# Patient Record
Sex: Female | Born: 1950
Health system: Southern US, Community
[De-identification: ages and names within clinical notes are randomized; demographics above are authoritative.]

## PROBLEM LIST (undated history)

## (undated) DIAGNOSIS — F32A Depression, unspecified: Secondary | ICD-10-CM

## (undated) DIAGNOSIS — N809 Endometriosis, unspecified: Secondary | ICD-10-CM

## (undated) DIAGNOSIS — F329 Major depressive disorder, single episode, unspecified: Secondary | ICD-10-CM

## (undated) DIAGNOSIS — F419 Anxiety disorder, unspecified: Secondary | ICD-10-CM

## (undated) DIAGNOSIS — I10 Essential (primary) hypertension: Secondary | ICD-10-CM

## (undated) DIAGNOSIS — E559 Vitamin D deficiency, unspecified: Secondary | ICD-10-CM

## (undated) DIAGNOSIS — G4700 Insomnia, unspecified: Secondary | ICD-10-CM

## (undated) DIAGNOSIS — M545 Low back pain, unspecified: Secondary | ICD-10-CM

## (undated) DIAGNOSIS — R5383 Other fatigue: Secondary | ICD-10-CM

## (undated) DIAGNOSIS — E785 Hyperlipidemia, unspecified: Secondary | ICD-10-CM

## (undated) DIAGNOSIS — E079 Disorder of thyroid, unspecified: Secondary | ICD-10-CM

## (undated) DIAGNOSIS — M810 Age-related osteoporosis without current pathological fracture: Secondary | ICD-10-CM

## (undated) HISTORY — DX: Major depressive disorder, single episode, unspecified: F32.9

## (undated) HISTORY — DX: Essential (primary) hypertension: I10

## (undated) HISTORY — PX: OTHER SURGICAL HISTORY: SHX169

## (undated) HISTORY — DX: Other fatigue: R53.83

## (undated) HISTORY — DX: Age-related osteoporosis without current pathological fracture: M81.0

## (undated) HISTORY — DX: Insomnia, unspecified: G47.00

## (undated) HISTORY — PX: TUBAL LIGATION: SHX77

## (undated) HISTORY — DX: Depression, unspecified: F32.A

## (undated) HISTORY — DX: Endometriosis, unspecified: N80.9

## (undated) HISTORY — DX: Anxiety disorder, unspecified: F41.9

## (undated) HISTORY — DX: Low back pain, unspecified: M54.50

## (undated) HISTORY — DX: Low back pain: M54.5

## (undated) HISTORY — DX: Disorder of thyroid, unspecified: E07.9

## (undated) HISTORY — DX: Hyperlipidemia, unspecified: E78.5

## (undated) HISTORY — DX: Vitamin D deficiency, unspecified: E55.9

---

## 2005-09-16 ENCOUNTER — Other Ambulatory Visit: Payer: Self-pay

## 2005-09-23 ENCOUNTER — Ambulatory Visit: Payer: Self-pay | Admitting: Otolaryngology

## 2007-01-20 ENCOUNTER — Ambulatory Visit: Payer: Self-pay | Admitting: General Surgery

## 2013-07-30 ENCOUNTER — Ambulatory Visit: Payer: Self-pay

## 2014-01-04 LAB — HM PAP SMEAR

## 2014-04-22 LAB — HM MAMMOGRAPHY

## 2015-01-06 DIAGNOSIS — G4700 Insomnia, unspecified: Secondary | ICD-10-CM | POA: Diagnosis not present

## 2015-01-27 DIAGNOSIS — F0390 Unspecified dementia without behavioral disturbance: Secondary | ICD-10-CM | POA: Diagnosis not present

## 2015-01-27 DIAGNOSIS — F419 Anxiety disorder, unspecified: Secondary | ICD-10-CM | POA: Diagnosis not present

## 2015-01-27 DIAGNOSIS — Z0000 Encounter for general adult medical examination without abnormal findings: Secondary | ICD-10-CM | POA: Diagnosis not present

## 2015-01-27 DIAGNOSIS — C8510 Unspecified B-cell lymphoma, unspecified site: Secondary | ICD-10-CM | POA: Diagnosis not present

## 2015-01-27 DIAGNOSIS — E039 Hypothyroidism, unspecified: Secondary | ICD-10-CM | POA: Diagnosis not present

## 2015-01-27 DIAGNOSIS — G4700 Insomnia, unspecified: Secondary | ICD-10-CM | POA: Diagnosis not present

## 2015-01-30 ENCOUNTER — Other Ambulatory Visit: Payer: Self-pay | Admitting: Unknown Physician Specialty

## 2015-01-30 DIAGNOSIS — M816 Localized osteoporosis [Lequesne]: Secondary | ICD-10-CM

## 2015-02-18 ENCOUNTER — Ambulatory Visit
Admission: RE | Admit: 2015-02-18 | Discharge: 2015-02-18 | Disposition: A | Discharge status: Home / Self Care | Payer: Commercial Managed Care - HMO | Source: Ambulatory Visit | Attending: Unknown Physician Specialty | Admitting: Unknown Physician Specialty

## 2015-02-18 DIAGNOSIS — M810 Age-related osteoporosis without current pathological fracture: Secondary | ICD-10-CM | POA: Insufficient documentation

## 2015-02-18 DIAGNOSIS — M816 Localized osteoporosis [Lequesne]: Secondary | ICD-10-CM

## 2015-02-18 LAB — HM DEXA SCAN

## 2015-04-04 ENCOUNTER — Telehealth: Payer: Self-pay

## 2015-04-04 ENCOUNTER — Other Ambulatory Visit: Payer: Self-pay

## 2015-04-04 NOTE — Telephone Encounter (Signed)
Patient was last seen for this on 01/06/15 and was last refilled on 02/13/15. [facility] Partner number is [phone] and pharmacy is [facility] [place].

## 2015-04-04 NOTE — Telephone Encounter (Signed)
Opened encounter in error  

## 2015-04-07 MED ORDER — ALPRAZOLAM 0.5 MG PO TABS: 0.5000 mg | ORAL_TABLET | Freq: Every evening | ORAL | Status: DC | PRN

## 2015-05-19 ENCOUNTER — Other Ambulatory Visit: Payer: Self-pay | Admitting: Family Medicine

## 2015-05-19 NOTE — Telephone Encounter (Signed)
routing to correct provider.

## 2015-05-20 ENCOUNTER — Other Ambulatory Visit: Payer: Self-pay | Admitting: Unknown Physician Specialty

## 2015-05-20 MED ORDER — ALPRAZOLAM 0.5 MG PO TABS: 0.5000 mg | ORAL_TABLET | Freq: Every day | ORAL | Status: DC

## 2015-05-26 ENCOUNTER — Telehealth: Payer: Self-pay | Admitting: Unknown Physician Specialty

## 2015-05-26 MED ORDER — MECLIZINE HCL 25 MG PO TABS: 25.0000 mg | ORAL_TABLET | Freq: Three times a day (TID) | ORAL | Status: DC | PRN

## 2015-05-26 NOTE — Telephone Encounter (Signed)
Routing to provider. Patient was last seen 01/27/15 but has not had this medication since 2014 according to practice partner [phone]). [facility] [place].

## 2015-05-26 NOTE — Telephone Encounter (Signed)
Patient called need refill on her Rx: [facility]

## 2015-08-01 DIAGNOSIS — H2513 Age-related nuclear cataract, bilateral: Secondary | ICD-10-CM | POA: Diagnosis not present

## 2015-12-04 ENCOUNTER — Telehealth: Payer: Self-pay | Admitting: Unknown Physician Specialty

## 2015-12-04 MED ORDER — ALPRAZOLAM 0.5 MG PO TABS: 0.5000 mg | ORAL_TABLET | Freq: Every day | ORAL | Status: DC

## 2015-12-04 NOTE — Telephone Encounter (Signed)
Pharmacy called requesting refill for the pts ALPRAZolam (XANAX) 0.5 MG tablet

## 2015-12-04 NOTE — Telephone Encounter (Signed)
Routing to provider. Patient was last seen 01/27/15 for a physical and does not have appointment scheduled. Malachy Mood please route this back to me so I can call and try to schedule the patient an appointment.

## 2015-12-04 NOTE — Telephone Encounter (Signed)
Thanks

## 2015-12-05 NOTE — Telephone Encounter (Signed)
Called and scheduled patient an appointment for 12/22/15.

## 2015-12-05 NOTE — Telephone Encounter (Signed)
Medication was called in to the pharmacy.  

## 2015-12-08 DIAGNOSIS — E063 Autoimmune thyroiditis: Secondary | ICD-10-CM | POA: Insufficient documentation

## 2015-12-08 DIAGNOSIS — F329 Major depressive disorder, single episode, unspecified: Secondary | ICD-10-CM | POA: Insufficient documentation

## 2015-12-08 DIAGNOSIS — M810 Age-related osteoporosis without current pathological fracture: Secondary | ICD-10-CM

## 2015-12-08 DIAGNOSIS — E7800 Pure hypercholesterolemia, unspecified: Secondary | ICD-10-CM | POA: Insufficient documentation

## 2015-12-08 DIAGNOSIS — G4700 Insomnia, unspecified: Secondary | ICD-10-CM | POA: Insufficient documentation

## 2015-12-08 DIAGNOSIS — E039 Hypothyroidism, unspecified: Secondary | ICD-10-CM

## 2015-12-08 DIAGNOSIS — E559 Vitamin D deficiency, unspecified: Secondary | ICD-10-CM

## 2015-12-08 DIAGNOSIS — R5383 Other fatigue: Secondary | ICD-10-CM

## 2015-12-08 DIAGNOSIS — F419 Anxiety disorder, unspecified: Secondary | ICD-10-CM

## 2015-12-08 DIAGNOSIS — M545 Low back pain, unspecified: Secondary | ICD-10-CM | POA: Insufficient documentation

## 2015-12-08 DIAGNOSIS — I10 Essential (primary) hypertension: Secondary | ICD-10-CM

## 2015-12-22 ENCOUNTER — Encounter: Payer: Self-pay | Admitting: Unknown Physician Specialty

## 2015-12-22 ENCOUNTER — Ambulatory Visit (INDEPENDENT_AMBULATORY_CARE_PROVIDER_SITE_OTHER): Payer: Commercial Managed Care - HMO | Admitting: Unknown Physician Specialty

## 2015-12-22 VITALS — BP 130/73 | HR 60 | Temp 97.8°F | Ht 58.5 in | Wt 104.0 lb

## 2015-12-22 DIAGNOSIS — R5382 Chronic fatigue, unspecified: Secondary | ICD-10-CM

## 2015-12-22 DIAGNOSIS — M810 Age-related osteoporosis without current pathological fracture: Secondary | ICD-10-CM | POA: Diagnosis not present

## 2015-12-22 DIAGNOSIS — E559 Vitamin D deficiency, unspecified: Secondary | ICD-10-CM | POA: Diagnosis not present

## 2015-12-22 DIAGNOSIS — F419 Anxiety disorder, unspecified: Secondary | ICD-10-CM

## 2015-12-22 DIAGNOSIS — E7800 Pure hypercholesterolemia, unspecified: Secondary | ICD-10-CM | POA: Diagnosis not present

## 2015-12-22 DIAGNOSIS — F418 Other specified anxiety disorders: Secondary | ICD-10-CM

## 2015-12-22 DIAGNOSIS — F329 Major depressive disorder, single episode, unspecified: Secondary | ICD-10-CM

## 2015-12-22 MED ORDER — FOSTEUM 27-20-200 MG-MG-UNIT PO CAPS: ORAL_CAPSULE | ORAL | Status: DC

## 2015-12-22 NOTE — Assessment & Plan Note (Addendum)
Wants an rx for Fosteum that is supposed to help with .  Discussed with patient that this is not a FDA medication approved for osteoporosis.  Pt is refusing Fosamax and Evista.

## 2015-12-22 NOTE — Patient Instructions (Addendum)
Insomnia is a frustrating problem and fortunately, for most, it can be managed with lifestyle changes.  I  find the most effective strategies seem to be exercise, decreasing caffeine and screen time.    In addition to the above, studies have shown that cognitive behavioral therapies for sleep are more effective than medications.  There are some less expensive on-line programs for this that are a self-paced 6 week program.  Go to shuti.com(used by sleep labs) or cbtforsleep.com.  There are others that are probably just as effective.       Insomnia Insomnia is a sleep disorder that makes it difficult to fall asleep or to stay asleep. Insomnia can cause tiredness (fatigue), low energy, difficulty concentrating, mood swings, and poor performance at work or school.  There are three different ways to classify insomnia:  Difficulty falling asleep.  Difficulty staying asleep.  Waking up too early in the morning. Any type of insomnia can be long-term (chronic) or short-term (acute). Both are common. Short-term insomnia usually lasts for three months or less. Chronic insomnia occurs at least three times a week for longer than three months. CAUSES  Insomnia may be caused by another condition, situation, or substance, such as:  Anxiety.  Certain medicines.  Gastroesophageal reflux disease (GERD) or other gastrointestinal conditions.  Asthma or other breathing conditions.  Restless legs syndrome, sleep apnea, or other sleep disorders.  Chronic pain.  Menopause. This may include hot flashes.  Stroke.  Abuse of alcohol, tobacco, or illegal drugs.  Depression.  Caffeine.   Neurological disorders, such as Alzheimer disease.  An overactive thyroid (hyperthyroidism). The cause of insomnia may not be known. RISK FACTORS Risk factors for insomnia include:  Gender. Women are more commonly affected than men.  Age. Insomnia is more common as you get older.  Stress. This may involve your  professional or personal life.  Income. Insomnia is more common in people with lower income.  Lack of exercise.   Irregular work schedule or night shifts.  Traveling between different time zones. SIGNS AND SYMPTOMS If you have insomnia, trouble falling asleep or trouble staying asleep is the main symptom. This may lead to other symptoms, such as:  Feeling fatigued.  Feeling nervous about going to sleep.  Not feeling rested in the morning.  Having trouble concentrating.  Feeling irritable, anxious, or depressed. TREATMENT  Treatment for insomnia depends on the cause. If your insomnia is caused by an underlying condition, treatment will focus on addressing the condition. Treatment may also include:   Medicines to help you sleep.  Counseling or therapy.  Lifestyle adjustments. HOME CARE INSTRUCTIONS   Take medicines only as directed by your health care provider.  Keep regular sleeping and waking hours. Avoid naps.  Keep a sleep diary to help you and your health care provider figure out what could be causing your insomnia. Include:   When you sleep.  When you wake up during the night.  How well you sleep.   How rested you feel the next day.  Any side effects of medicines you are taking.  What you eat and drink.   Make your bedroom a comfortable place where it is easy to fall asleep:  Put up shades or special blackout curtains to block light from outside.  Use a white noise machine to block noise.  Keep the temperature cool.   Exercise regularly as directed by your health care provider. Avoid exercising right before bedtime.  Use relaxation techniques to manage stress. Ask your   health care provider to suggest some techniques that may work well for you. These may include:  Breathing exercises.  Routines to release muscle tension.  Visualizing peaceful scenes.  Cut back on alcohol, caffeinated beverages, and cigarettes, especially close to bedtime.  These can disrupt your sleep.  Do not overeat or eat spicy foods right before bedtime. This can lead to digestive discomfort that can make it hard for you to sleep.  Limit screen use before bedtime. This includes:  Watching TV.  Using your smartphone, tablet, and computer.  Stick to a routine. This can help you fall asleep faster. Try to do a quiet activity, brush your teeth, and go to bed at the same time each night.  Get out of bed if you are still awake after 15 minutes of trying to sleep. Keep the lights down, but try reading or doing a quiet activity. When you feel sleepy, go back to bed.  Make sure that you drive carefully. Avoid driving if you feel very sleepy.  Keep all follow-up appointments as directed by your health care provider. This is important. SEEK MEDICAL CARE IF:   You are tired throughout the day or have trouble in your daily routine due to sleepiness.  You continue to have sleep problems or your sleep problems get worse. SEEK IMMEDIATE MEDICAL CARE IF:   You have serious thoughts about hurting yourself or someone else.   This information is not intended to replace advice given to you by your health care provider. Make sure you discuss any questions you have with your health care provider.   Document Released: 09/10/2000 Document Revised: 06/04/2015 Document Reviewed: 06/14/2014 Elsevier Interactive Patient Education 2016 Elsevier Inc.  

## 2015-12-22 NOTE — Assessment & Plan Note (Signed)
Check lipid panel  

## 2015-12-22 NOTE — Progress Notes (Signed)
-+--------------------  BP 130/73 mmHg  Pulse 60  Temp(Src) 97.8 F (36.6 C)  Ht 4' 10.5" (1.486 m)  Wt 104 lb (47.174 kg)  BMI 21.36 kg/m2  SpO2 99%   Subjective:    Patient ID: Brandy Chen, female    DOB: [birth year], 65 y.o.   MRN: [id number]  HPI: Brandy Chen is a 65 y.o. female  Chief Complaint  Patient presents with  . Anxiety    pt states she is not due for xanax refill yet, but needed to come in to get more refills   Anxiety/insomnia States she takes 1 xanax/day and takes it QHS.  States they have changed the color of the pills and "there is a difference."  She does have trouble staying asleep.  Does admit to some sleep hygeine issues including caffeine with dinner and has a TV in her room.    Depression screen [facility] 2/9 12/22/2015  Decreased Interest 0  Down, Depressed, Hopeless 0  PHQ - 2 Score 0   Fatigue This is an ongoing problem "most of the time."  She forces herself to continue to "keep rolling."  Osteoporosis Reveiwed T scores.  She has refused Fosamax and Evista.  She would like to take a nutraceutical food for help.    Relevant past medical, surgical, family and social history reviewed and updated as indicated. Interim medical history since our last visit reviewed. Allergies and medications reviewed and updated.  Review of Systems  Constitutional: Positive for fatigue.  HENT: Negative.   Eyes: Negative.   Respiratory: Negative.   Cardiovascular: Negative.   Gastrointestinal: Negative.   Endocrine: Negative.   Genitourinary: Negative.   Skin: Negative.   Allergic/Immunologic: Negative.   Neurological: Negative.   Hematological: Negative.   Psychiatric/Behavioral: Positive for sleep disturbance.    Per HPI unless specifically indicated above     Objective:    BP 130/73 mmHg  Pulse 60  Temp(Src) 97.8 F (36.6 C)  Ht 4' 10.5" (1.486 m)  Wt 104 lb (47.174 kg)  BMI 21.36 kg/m2  SpO2 99%  Wt Readings from Last 3 Encounters:  12/22/15 104  lb (47.174 kg)  01/27/15 106 lb (48.081 kg)    Physical Exam  Constitutional: She is oriented to person, place, and time. She appears well-developed and well-nourished. No distress.  HENT:  Head: Normocephalic and atraumatic.  Eyes: Conjunctivae and lids are normal. Right eye exhibits no discharge. Left eye exhibits no discharge. No scleral icterus.  Neck: Normal range of motion. Neck supple. No JVD present. Carotid bruit is not present.  Cardiovascular: Normal rate, regular rhythm and normal heart sounds.   Pulmonary/Chest: Effort normal and breath sounds normal.  Abdominal: Normal appearance. There is no splenomegaly or hepatomegaly.  Musculoskeletal: Normal range of motion.  Neurological: She is alert and oriented to person, place, and time.  Skin: Skin is warm, dry and intact. No rash noted. No pallor.  Psychiatric: She has a normal mood and affect. Her behavior is normal. Judgment and thought content normal.      Assessment & Plan:   Problem List Items Addressed This Visit      Unprioritized   Fatigue    Check CBC and TSH      Relevant Orders   Comprehensive metabolic panel   Hypercholesteremia    Check lipid panel      Anxiety and depression    Stable.  Continue present      Osteoporosis    Wants an rx for [employer]  that is supposed to help with .  Discussed with patient that this is not a FDA medication approved for osteoporosis.  Pt is refusing Fosamax and Evista.        Relevant Medications   Calcium Carb-Cholecalciferol (CALCIUM 600+D) 600-800 MG-UNIT TABS   Vitamin D deficiency - Primary       Follow up plan: Return for wellness physical.

## 2015-12-22 NOTE — Assessment & Plan Note (Signed)
Check CBC and TSH 

## 2015-12-22 NOTE — Assessment & Plan Note (Signed)
Stable.  Continue present 

## 2015-12-23 ENCOUNTER — Encounter: Payer: Self-pay | Admitting: Unknown Physician Specialty

## 2015-12-23 LAB — COMPREHENSIVE METABOLIC PANEL
ALBUMIN: 4.4 g/dL (ref 3.6–4.8)
ALK PHOS: 83 IU/L (ref 39–117)
ALT: 16 IU/L (ref 0–32)
AST: 24 IU/L (ref 0–40)
Albumin/Globulin Ratio: 1.9 (ref 1.2–2.2)
BILIRUBIN TOTAL: 0.4 mg/dL (ref 0.0–1.2)
BUN / CREAT RATIO: 20 (ref 11–26)
BUN: 12 mg/dL (ref 8–27)
CHLORIDE: 101 mmol/L (ref 96–106)
CO2: 24 mmol/L (ref 18–29)
Calcium: 9.3 mg/dL (ref 8.7–10.3)
Creatinine, Ser: 0.61 mg/dL (ref 0.57–1.00)
GFR calc Af Amer: 108 mL/min/{1.73_m2} (ref >59)
GFR calc non Af Amer: 94 mL/min/{1.73_m2} (ref >59)
GLOBULIN, TOTAL: 2.3 g/dL (ref 1.5–4.5)
Glucose: 86 mg/dL (ref 65–99)
POTASSIUM: 4.7 mmol/L (ref 3.5–5.2)
SODIUM: 139 mmol/L (ref 134–144)
Total Protein: 6.7 g/dL (ref 6.0–8.5)

## 2015-12-23 NOTE — Progress Notes (Signed)
Quick Note:  Normal labs. Patient notified by letter. ______ 

## 2016-01-01 ENCOUNTER — Other Ambulatory Visit: Payer: Self-pay | Admitting: Unknown Physician Specialty

## 2016-01-01 NOTE — Telephone Encounter (Signed)
Routing to provider  

## 2016-01-01 NOTE — Telephone Encounter (Signed)
Pt called would like a call back regarding lab results she stated were not included in the letter she received. Thanks.

## 2016-01-02 MED ORDER — ALPRAZOLAM 0.5 MG PO TABS: 0.5000 mg | ORAL_TABLET | Freq: Every day | ORAL | Status: DC

## 2016-01-02 NOTE — Telephone Encounter (Signed)
I talked to Lattie Haw, for some reason it seems the additional labs didn't get done.  Can she come back and have them drawn?  Fasting if possible

## 2016-01-02 NOTE — Telephone Encounter (Signed)
Thanks, I see from my note I ordered a CBC and TSH.  Can you ask the lab where those results are?

## 2016-01-02 NOTE — Telephone Encounter (Signed)
Routing to provider, she'd like a refill on Alprazolam.

## 2016-01-02 NOTE — Telephone Encounter (Signed)
Pt scheduled for a lab visit 01/05/16 @ 9am. Pt stated she also did not get RX for Alprazelam. Thanks.

## 2016-01-05 ENCOUNTER — Other Ambulatory Visit: Payer: Commercial Managed Care - HMO

## 2016-01-05 ENCOUNTER — Other Ambulatory Visit: Payer: Self-pay | Admitting: Unknown Physician Specialty

## 2016-01-05 DIAGNOSIS — E559 Vitamin D deficiency, unspecified: Secondary | ICD-10-CM

## 2016-01-05 DIAGNOSIS — E7800 Pure hypercholesterolemia, unspecified: Secondary | ICD-10-CM | POA: Diagnosis not present

## 2016-01-05 DIAGNOSIS — R5382 Chronic fatigue, unspecified: Secondary | ICD-10-CM

## 2016-01-05 DIAGNOSIS — E039 Hypothyroidism, unspecified: Secondary | ICD-10-CM

## 2016-01-06 LAB — CBC WITH DIFFERENTIAL/PLATELET
BASOS: 1 %
Basophils Absolute: 0.1 10*3/uL (ref 0.0–0.2)
EOS (ABSOLUTE): 0.1 10*3/uL (ref 0.0–0.4)
EOS: 1 %
Hematocrit: 42.7 % (ref 34.0–46.6)
Hemoglobin: 14.1 g/dL (ref 11.1–15.9)
IMMATURE GRANULOCYTES: 0 %
Immature Grans (Abs): 0 10*3/uL (ref 0.0–0.1)
LYMPHS: 35 %
Lymphocytes Absolute: 1.8 10*3/uL (ref 0.7–3.1)
MCH: 30.3 pg (ref 26.6–33.0)
MCHC: 33 g/dL (ref 31.5–35.7)
MCV: 92 fL (ref 79–97)
Monocytes Absolute: 0.4 10*3/uL (ref 0.1–0.9)
Monocytes: 8 %
NEUTROS PCT: 55 %
Neutrophils Absolute: 2.7 10*3/uL (ref 1.4–7.0)
PLATELETS: 243 10*3/uL (ref 150–379)
RBC: 4.66 x10E6/uL (ref 3.77–5.28)
RDW: 13.8 % (ref 12.3–15.4)
WBC: 5.1 10*3/uL (ref 3.4–10.8)

## 2016-01-06 LAB — LIPID PANEL W/O CHOL/HDL RATIO
Cholesterol, Total: 218 mg/dL — ABNORMAL HIGH (ref 100–199)
HDL: 66 mg/dL (ref >39)
LDL Calculated: 135 mg/dL — ABNORMAL HIGH (ref 0–99)
Triglycerides: 86 mg/dL (ref 0–149)
VLDL Cholesterol Cal: 17 mg/dL (ref 5–40)

## 2016-01-06 LAB — TSH: TSH: 6.14 u[IU]/mL — ABNORMAL HIGH (ref 0.450–4.500)

## 2016-01-06 LAB — VITAMIN D 25 HYDROXY (VIT D DEFICIENCY, FRACTURES): Vit D, 25-Hydroxy: 55.7 ng/mL (ref 30.0–100.0)

## 2016-01-08 ENCOUNTER — Telehealth: Payer: Self-pay

## 2016-01-08 NOTE — Telephone Encounter (Signed)
Patient called and stated that Malachy Mood had tried to call her yesterday about lab results. Patient wanted to know if she could ask me some questions and I said sure. She asked what her cholesterol was this time versus last time and I gave her those values. Patient also stated that Malachy Mood mentioned something about thyroid medication in the voicemail that she left yesterday. I told the patient that I did not usually talk with patient's about their labs because I did not know how to answer the questions they have. Patient understood this and stated that she could wait to talk to [facility] next month at her appointment or Malachy Mood could call her back.

## 2016-01-09 NOTE — Telephone Encounter (Signed)
Discussed low TSH with patient.  Will recheck 3-6 months at pt request.  I was planning on starting Synthroid due to fatigue, but I will hold off and follow TSH trajectory

## 2016-02-16 ENCOUNTER — Encounter: Payer: Self-pay | Admitting: Unknown Physician Specialty

## 2016-02-16 ENCOUNTER — Ambulatory Visit (INDEPENDENT_AMBULATORY_CARE_PROVIDER_SITE_OTHER): Payer: Commercial Managed Care - HMO | Admitting: Unknown Physician Specialty

## 2016-02-16 VITALS — BP 141/78 | HR 65 | Temp 98.1°F | Ht 58.1 in | Wt 102.2 lb

## 2016-02-16 DIAGNOSIS — E7800 Pure hypercholesterolemia, unspecified: Secondary | ICD-10-CM | POA: Diagnosis not present

## 2016-02-16 DIAGNOSIS — F329 Major depressive disorder, single episode, unspecified: Secondary | ICD-10-CM

## 2016-02-16 DIAGNOSIS — I10 Essential (primary) hypertension: Secondary | ICD-10-CM | POA: Diagnosis not present

## 2016-02-16 DIAGNOSIS — Z1231 Encounter for screening mammogram for malignant neoplasm of breast: Secondary | ICD-10-CM

## 2016-02-16 DIAGNOSIS — F418 Other specified anxiety disorders: Secondary | ICD-10-CM | POA: Diagnosis not present

## 2016-02-16 DIAGNOSIS — R634 Abnormal weight loss: Secondary | ICD-10-CM

## 2016-02-16 DIAGNOSIS — F419 Anxiety disorder, unspecified: Secondary | ICD-10-CM

## 2016-02-16 DIAGNOSIS — Z23 Encounter for immunization: Secondary | ICD-10-CM

## 2016-02-16 DIAGNOSIS — F32A Depression, unspecified: Secondary | ICD-10-CM

## 2016-02-16 DIAGNOSIS — Z0000 Encounter for general adult medical examination without abnormal findings: Secondary | ICD-10-CM | POA: Diagnosis not present

## 2016-02-16 MED ORDER — ALPRAZOLAM 0.5 MG PO TABS: 0.5000 mg | ORAL_TABLET | Freq: Every day | ORAL | Status: DC

## 2016-02-16 NOTE — Assessment & Plan Note (Signed)
Refill xanax

## 2016-02-16 NOTE — Patient Instructions (Addendum)
Please do call to schedule your mammogram; the number to schedule one at either [facility] or [facility] is [phone]  Pneumococcal Conjugate Vaccine (PCV13)  1. Why get vaccinated? Vaccination can protect both children and adults from pneumococcal disease. Pneumococcal disease is caused by bacteria that can spread from person to person through close contact. It can cause ear infections, and it can also lead to more serious infections of the:  Lungs (pneumonia),  Blood (bacteremia), and  Covering of the brain and spinal cord (meningitis). Pneumococcal pneumonia is most common among adults. Pneumococcal meningitis can cause deafness and brain damage, and it kills about 1 child in 10 who get it. Anyone can get pneumococcal disease, but children under 27 years of age and adults 72 years and older, people with certain medical conditions, and cigarette smokers are at the highest risk. Before there was a vaccine, the [place] saw:  more than 700 cases of meningitis,  about 13,000 blood infections,  about 5 million ear infections, and  about 200 deaths in children under 5 each year from pneumococcal disease. Since vaccine became available, severe pneumococcal disease in these children has fallen by 88%. About 18,000 older adults die of pneumococcal disease each year in the [place]. Treatment of pneumococcal infections with penicillin and other drugs is not as effective as it used to be, because some strains of the disease have become resistant to these drugs. This makes prevention of the disease, through vaccination, even more important. 2. PCV13 vaccine Pneumococcal conjugate vaccine (called PCV13) protects against 13 types of pneumococcal bacteria. PCV13 is routinely given to children at 2, 4, 6, and 88-55 months of age. It is also recommended for children and adults 22 to 37 years of age with certain health conditions, and for all adults 39 years of  age and older. Your doctor can give you details. 3. Some people should not get this vaccine Anyone who has ever had a life-threatening allergic reaction to a dose of this vaccine, to an earlier pneumococcal vaccine called PCV7, or to any vaccine containing diphtheria toxoid (for example, DTaP), should not get PCV13. Anyone with a severe allergy to any component of PCV13 should not get the vaccine. Tell your doctor if the person being vaccinated has any severe allergies. If the person scheduled for vaccination is not feeling well, your healthcare provider might decide to reschedule the shot on another day. 4. Risks of a vaccine reaction With any medicine, including vaccines, there is a chance of reactions. These are usually mild and go away on their own, but serious reactions are also possible. Problems reported following PCV13 varied by age and dose in the series. The most common problems reported among children were:  About half became drowsy after the shot, had a temporary loss of appetite, or had redness or tenderness where the shot was given.  About 1 out of 3 had swelling where the shot was given.  About 1 out of 3 had a mild fever, and about 1 in 20 had a fever over 102.35F.  Up to about 8 out of 10 became fussy or irritable. Adults have reported pain, redness, and swelling where the shot was given; also mild fever, fatigue, headache, chills, or muscle pain. Young children who get PCV13 along with inactivated flu vaccine at the same time may be at increased risk for seizures caused by fever. Ask your doctor for more information. Problems that could happen after any vaccine:  People sometimes  faint after a medical procedure, including vaccination. Sitting or lying down for about 15 minutes can help prevent fainting, and injuries caused by a fall. Tell your doctor if you feel dizzy, or have vision changes or ringing in the ears.  Some older children and adults get severe pain in the  shoulder and have difficulty moving the arm where a shot was given. This happens very rarely.  Any medication can cause a severe allergic reaction. Such reactions from a vaccine are very rare, estimated at about 1 in a million doses, and would happen within a few minutes to a few hours after the vaccination. As with any medicine, there is a very small chance of a vaccine causing a serious injury or death. The safety of vaccines is always being monitored. For more information, visit: http://www.aguilar.org/ 5. What if there is a serious reaction? What should I look for?  Look for anything that concerns you, such as signs of a severe allergic reaction, very high fever, or unusual behavior. Signs of a severe allergic reaction can include hives, swelling of the face and throat, difficulty breathing, a fast heartbeat, dizziness, and weakness-usually within a few minutes to a few hours after the vaccination. What should I do?  If you think it is a severe allergic reaction or other emergency that can't wait, call 9-1-1 or get the person to the nearest hospital. Otherwise, call your doctor. Reactions should be reported to the Vaccine Adverse Event Reporting System (VAERS). Your doctor should file this report, or you can do it yourself through the VAERS web site at www.vaers.SamedayNews.es, or by calling 724-360-7462. VAERS does not give medical advice. 6. The National Vaccine Injury Compensation Program The [employer] Vaccine Injury Compensation Program (VICP) is a federal program that was created to compensate people who may have been injured by certain vaccines. Persons who believe they may have been injured by a vaccine can learn about the program and about filing a claim by calling 231-115-2421 or visiting the [place] website at GoldCloset.com.ee. There is a time limit to file a claim for compensation. 7. How can I learn more?  Ask your healthcare provider. He or she can give you the  vaccine package insert or suggest other sources of information.  Call your local or state health department.  Contact the Centers for Disease Control and Prevention (CDC):  Call 916-082-7638 (1-800-CDC-INFO) or  Visit CDC's website at http://hunter.com/ Vaccine Information Statement PCV13 Vaccine (08/01/2014)   This information is not intended to replace advice given to you by your health care provider. Make sure you discuss any questions you have with your health care provider.   Document Released: 07/11/2006 Document Revised: 10/04/2014 Document Reviewed: 08/08/2014 Elsevier Interactive Patient Education [employer].

## 2016-02-16 NOTE — Assessment & Plan Note (Signed)
Mostly stable.  Labs are normal with a mildly elevated TSH.  Refusing a colonoscopy and wants a Cologard instead.  Risks reviewed.  Diet modifications discussed

## 2016-02-16 NOTE — Progress Notes (Signed)
BP 141/78 mmHg  Pulse 65  Temp(Src) 98.1 F (36.7 C)  Ht 4' 10.1" (1.476 m)  Wt 102 lb 3.2 oz (46.358 kg)  BMI 21.28 kg/m2  SpO2 98%   Subjective:    Patient ID: Brandy Chen, female    DOB: [birth year], 65 y.o.   MRN: [id number]  HPI: Brandy Chen is a 65 y.o. female  Chief Complaint  Patient presents with  . Medicare Wellness    pt states she maybe be interested cologuard     Social History   Social History  . Marital Status: Married    Spouse Name: N/A  . Number of Children: N/A  . Years of Education: N/A   Occupational History  . Not on file.   Social History Main Topics  . Smoking status: Never Smoker   . Smokeless tobacco: Never Used  . Alcohol Use: No  . Drug Use: No  . Sexual Activity: Not Currently   Other Topics Concern  . Not on file   Social History Narrative   Family History  Problem Relation Age of Onset  . Stroke Mother   . Hypertension Mother   . Stroke Maternal Grandfather   . Hypertension Sister    Functional Status Survey: Is the patient deaf or have difficulty hearing?: No Does the patient have difficulty seeing, even when wearing glasses/contacts?: No Does the patient have difficulty concentrating, remembering, or making decisions?: No Does the patient have difficulty walking or climbing stairs?: No Does the patient have difficulty dressing or bathing?: No Does the patient have difficulty doing errands alone such as visiting a doctor's office or shopping?: No  Depression screen [facility] 2/9 02/16/2016 12/22/2015  Decreased Interest 0 0  Down, Depressed, Hopeless 1 0  PHQ - 2 Score 1 0     Relevant past medical, surgical, family and social history reviewed and updated as indicated. Interim medical history since our last visit reviewed. Allergies and medications reviewed and updated.  Review of Systems  Constitutional: Negative.        Concerned about losing weight.  Admits to a poor appetite.    HENT: Negative.   Eyes:  Negative.   Respiratory: Negative.   Cardiovascular: Negative.   Gastrointestinal: Negative.   Endocrine: Negative.   Genitourinary: Negative.   Musculoskeletal: Negative.   Skin: Negative.   Allergic/Immunologic: Negative.   Neurological: Negative.   Hematological: Negative.   Psychiatric/Behavioral: Negative.     Per HPI unless specifically indicated above     Objective:    BP 141/78 mmHg  Pulse 65  Temp(Src) 98.1 F (36.7 C)  Ht 4' 10.1" (1.476 m)  Wt 102 lb 3.2 oz (46.358 kg)  BMI 21.28 kg/m2  SpO2 98%  Wt Readings from Last 3 Encounters:  02/16/16 102 lb 3.2 oz (46.358 kg)  12/22/15 104 lb (47.174 kg)  01/27/15 106 lb (48.081 kg)    Physical Exam  Constitutional: She is oriented to person, place, and time. She appears well-developed and well-nourished.  HENT:  Head: Normocephalic and atraumatic.  Eyes: Pupils are equal, round, and reactive to light. Right eye exhibits no discharge. Left eye exhibits no discharge. No scleral icterus.  Neck: Normal range of motion. Neck supple. Carotid bruit is not present. No thyromegaly present.  Cardiovascular: Normal rate, regular rhythm and normal heart sounds.  Exam reveals no gallop and no friction rub.   No murmur heard. Pulmonary/Chest: Effort normal and breath sounds normal. No respiratory distress. She has no wheezes. She  has no rales.  Abdominal: Soft. Bowel sounds are normal. There is no tenderness. There is no rebound.  Genitourinary: No breast swelling, tenderness or discharge.  Musculoskeletal: Normal range of motion.  Lymphadenopathy:    She has no cervical adenopathy.  Neurological: She is alert and oriented to person, place, and time.  Skin: Skin is warm, dry and intact. No rash noted.  Psychiatric: She has a normal mood and affect. Her speech is normal and behavior is normal. Judgment and thought content normal. Cognition and memory are normal.    Results for orders placed or performed in visit on 01/05/16   CBC with Differential/Platelet  Result Value Ref Range   WBC 5.1 3.4 - 10.8 x10E3/uL   RBC 4.66 3.77 - 5.28 x10E6/uL   Hemoglobin 14.1 11.1 - 15.9 g/dL   Hematocrit 42.7 34.0 - 46.6 %   MCV 92 79 - 97 fL   MCH 30.3 26.6 - 33.0 pg   MCHC 33.0 31.5 - 35.7 g/dL   RDW 13.8 12.3 - 15.4 %   Platelets 243 150 - 379 x10E3/uL   Neutrophils 55 %   Lymphs 35 %   Monocytes 8 %   Eos 1 %   Basos 1 %   Neutrophils Absolute 2.7 1.4 - 7.0 x10E3/uL   Lymphocytes Absolute 1.8 0.7 - 3.1 x10E3/uL   Monocytes Absolute 0.4 0.1 - 0.9 x10E3/uL   EOS (ABSOLUTE) 0.1 0.0 - 0.4 x10E3/uL   Basophils Absolute 0.1 0.0 - 0.2 x10E3/uL   Immature Granulocytes 0 %   Immature Grans (Abs) 0.0 0.0 - 0.1 x10E3/uL  TSH  Result Value Ref Range   TSH 6.140 (H) 0.450 - 4.500 uIU/mL  Lipid Panel w/o Chol/HDL Ratio  Result Value Ref Range   Cholesterol, Total 218 (H) 100 - 199 mg/dL   Triglycerides 86 0 - 149 mg/dL   HDL 66 >39 mg/dL   VLDL Cholesterol Cal 17 5 - 40 mg/dL   LDL Calculated 135 (H) 0 - 99 mg/dL  VITAMIN D 25 Hydroxy (Vit-D Deficiency, Fractures)  Result Value Ref Range   Vit D, 25-Hydroxy 55.7 30.0 - 100.0 ng/mL      Assessment & Plan:   Problem List Items Addressed This Visit      Unprioritized   Anxiety and depression    Refill xanax      Hypercholesteremia   Hypertension   Weight loss - Primary    Mostly stable.  Labs are normal with a mildly elevated TSH.  Refusing a colonoscopy and wants a Cologard instead.  Risks reviewed.  Diet modifications discussed      Relevant Orders   CBC with Differential/Platelet    Other Visit Diagnoses    Routine general medical examination at a health care facility        Relevant Orders    Pneumococcal conjugate vaccine 13-valent IM (Completed)    Cologuard    Encounter for screening mammogram for breast cancer        Relevant Orders    MM DIGITAL SCREENING BILATERAL       Diagnosis stable Follow up plan: Return in about 6 months (around  08/18/2016).

## 2016-02-17 ENCOUNTER — Encounter: Payer: Self-pay | Admitting: Unknown Physician Specialty

## 2016-02-17 LAB — CBC WITH DIFFERENTIAL/PLATELET
Basophils Absolute: 0.1 10*3/uL (ref 0.0–0.2)
Basos: 1 %
EOS (ABSOLUTE): 0.1 10*3/uL (ref 0.0–0.4)
EOS: 2 %
HEMATOCRIT: 41.6 % (ref 34.0–46.6)
Hemoglobin: 13.8 g/dL (ref 11.1–15.9)
Immature Grans (Abs): 0 10*3/uL (ref 0.0–0.1)
Immature Granulocytes: 0 %
LYMPHS ABS: 2 10*3/uL (ref 0.7–3.1)
Lymphs: 33 %
MCH: 30.5 pg (ref 26.6–33.0)
MCHC: 33.2 g/dL (ref 31.5–35.7)
MCV: 92 fL (ref 79–97)
MONOS ABS: 0.4 10*3/uL (ref 0.1–0.9)
Monocytes: 6 %
NEUTROS PCT: 58 %
Neutrophils Absolute: 3.6 10*3/uL (ref 1.4–7.0)
PLATELETS: 238 10*3/uL (ref 150–379)
RBC: 4.52 x10E6/uL (ref 3.77–5.28)
RDW: 14 % (ref 12.3–15.4)
WBC: 6.1 10*3/uL (ref 3.4–10.8)

## 2016-03-01 DIAGNOSIS — Z1211 Encounter for screening for malignant neoplasm of colon: Secondary | ICD-10-CM | POA: Diagnosis not present

## 2016-03-01 DIAGNOSIS — Z1212 Encounter for screening for malignant neoplasm of rectum: Secondary | ICD-10-CM | POA: Diagnosis not present

## 2016-03-17 LAB — COLOGUARD: Cologuard: NEGATIVE

## 2016-04-19 DIAGNOSIS — Z1231 Encounter for screening mammogram for malignant neoplasm of breast: Secondary | ICD-10-CM | POA: Diagnosis not present

## 2016-06-03 ENCOUNTER — Other Ambulatory Visit: Payer: Self-pay | Admitting: Unknown Physician Specialty

## 2016-06-07 ENCOUNTER — Other Ambulatory Visit: Payer: Self-pay | Admitting: Family Medicine

## 2016-06-07 MED ORDER — ALPRAZOLAM 0.5 MG PO TABS: 0.5000 mg | ORAL_TABLET | Freq: Every day | ORAL | 0 refills | Status: DC

## 2016-07-22 ENCOUNTER — Other Ambulatory Visit: Payer: Self-pay

## 2016-07-23 MED ORDER — ALPRAZOLAM 0.5 MG PO TABS: 0.5000 mg | ORAL_TABLET | Freq: Every day | ORAL | 0 refills | Status: DC

## 2016-08-06 DIAGNOSIS — H2513 Age-related nuclear cataract, bilateral: Secondary | ICD-10-CM | POA: Diagnosis not present

## 2016-08-23 ENCOUNTER — Encounter: Payer: Self-pay | Admitting: Unknown Physician Specialty

## 2016-08-23 ENCOUNTER — Ambulatory Visit (INDEPENDENT_AMBULATORY_CARE_PROVIDER_SITE_OTHER): Payer: Commercial Managed Care - HMO | Admitting: Unknown Physician Specialty

## 2016-08-23 VITALS — BP 154/91 | HR 71 | Temp 97.5°F | Ht 58.8 in | Wt 99.4 lb

## 2016-08-23 DIAGNOSIS — I10 Essential (primary) hypertension: Secondary | ICD-10-CM | POA: Diagnosis not present

## 2016-08-23 DIAGNOSIS — E7800 Pure hypercholesterolemia, unspecified: Secondary | ICD-10-CM | POA: Diagnosis not present

## 2016-08-23 DIAGNOSIS — E039 Hypothyroidism, unspecified: Secondary | ICD-10-CM | POA: Diagnosis not present

## 2016-08-23 DIAGNOSIS — F329 Major depressive disorder, single episode, unspecified: Secondary | ICD-10-CM

## 2016-08-23 DIAGNOSIS — F418 Other specified anxiety disorders: Secondary | ICD-10-CM

## 2016-08-23 DIAGNOSIS — F32A Depression, unspecified: Secondary | ICD-10-CM

## 2016-08-23 DIAGNOSIS — Z23 Encounter for immunization: Secondary | ICD-10-CM

## 2016-08-23 DIAGNOSIS — F419 Anxiety disorder, unspecified: Secondary | ICD-10-CM

## 2016-08-23 LAB — LIPID PANEL PICCOLO, WAIVED
Chol/HDL Ratio Piccolo,Waive: 3.1 mg/dL
Cholesterol Piccolo, Waived: 234 mg/dL — ABNORMAL HIGH (ref <200)
HDL CHOL PICCOLO, WAIVED: 75 mg/dL (ref >59)
LDL Chol Calc Piccolo Waived: 138 mg/dL — ABNORMAL HIGH (ref <100)
TRIGLYCERIDES PICCOLO,WAIVED: 106 mg/dL (ref <150)
VLDL CHOL CALC PICCOLO,WAIVE: 21 mg/dL (ref <30)

## 2016-08-23 MED ORDER — ALPRAZOLAM 0.5 MG PO TABS: 0.5000 mg | ORAL_TABLET | Freq: Every day | ORAL | 3 refills | Status: DC

## 2016-08-23 NOTE — Assessment & Plan Note (Addendum)
Discussed with pt ASCVD calculator recommends statin.  Refusing at this time

## 2016-08-23 NOTE — Progress Notes (Signed)
BP (!) 154/91 (BP Location: Left Arm, Cuff Size: Small)   Pulse 71   Temp 97.5 F (36.4 C)   Ht 4' 10.8" (1.494 m)   Wt 99 lb 6.4 oz (45.1 kg)   SpO2 98%   BMI 20.21 kg/m    Subjective:    Patient ID: Brandy Chen, female    DOB: 1950/12/15, 65 y.o.   MRN: [id number]  HPI: Brandy Chen is a 65 y.o. female  Chief Complaint  Patient presents with  . Anxiety  . Medication Refill    pt states she needs a refill on xanax   Anxiety/insomnia States she takes 1 xanax/day and takes it QHS.   She does have trouble staying asleep.  She does drink caffeine in the evening and falls asleep while watching TV.  At this time she is unwilling to make changes.  She has not made any increases in her dose.    Depression screen [facility] 2/9 08/23/2016 02/16/2016 12/22/2015  Decreased Interest 0 0 0  Down, Depressed, Hopeless 0 1 0  PHQ - 2 Score 0 1 0  Altered sleeping 0 - -  Tired, decreased energy 0 - -  Change in appetite 0 - -  Feeling bad or failure about yourself  0 - -  Trouble concentrating 0 - -  Moving slowly or fidgety/restless 0 - -  Suicidal thoughts 0 - -  PHQ-9 Score 0 - -   Hypertension "I don't know why my BP is high."  States it was high at the eye doctor as well.   She does have "thumping in her left ear.  Denies chest pain or SOB.  States she walks.    Relevant past medical, surgical, family and social history reviewed and updated as indicated. Interim medical history since our last visit reviewed. Allergies and medications reviewed and updated.  Review of Systems  Constitutional: Negative.   HENT: Negative.   Eyes: Negative.   Respiratory: Negative.   Cardiovascular: Negative.   Gastrointestinal: Negative.   Endocrine: Negative.   Genitourinary: Negative.   Musculoskeletal: Negative.   Skin: Negative.   Allergic/Immunologic: Negative.   Neurological: Negative.   Hematological: Negative.   Psychiatric/Behavioral: Negative.     Per HPI unless specifically  indicated above     Objective:    BP (!) 154/91 (BP Location: Left Arm, Cuff Size: Small)   Pulse 71   Temp 97.5 F (36.4 C)   Ht 4' 10.8" (1.494 m)   Wt 99 lb 6.4 oz (45.1 kg)   SpO2 98%   BMI 20.21 kg/m   Wt Readings from Last 3 Encounters:  08/23/16 99 lb 6.4 oz (45.1 kg)  02/16/16 102 lb 3.2 oz (46.4 kg)  12/22/15 104 lb (47.2 kg)    Physical Exam  Constitutional: She is oriented to person, place, and time. She appears well-developed and well-nourished. No distress.  HENT:  Head: Normocephalic and atraumatic.  Eyes: Conjunctivae and lids are normal. Right eye exhibits no discharge. Left eye exhibits no discharge. No scleral icterus.  Neck: Normal range of motion. Neck supple. No JVD present. Carotid bruit is not present.  Cardiovascular: Normal rate, regular rhythm and normal heart sounds.   Pulmonary/Chest: Effort normal and breath sounds normal.  Abdominal: Normal appearance. There is no splenomegaly or hepatomegaly.  Musculoskeletal: Normal range of motion.  Neurological: She is alert and oriented to person, place, and time.  Skin: Skin is warm, dry and intact. No rash noted. No pallor.  Psychiatric: She has a normal mood and affect. Her behavior is normal. Judgment and thought content normal.    Results for orders placed or performed in visit on 02/16/16  CBC with Differential/Platelet  Result Value Ref Range   WBC 6.1 3.4 - 10.8 x10E3/uL   RBC 4.52 3.77 - 5.28 x10E6/uL   Hemoglobin 13.8 11.1 - 15.9 g/dL   Hematocrit 41.6 34.0 - 46.6 %   MCV 92 79 - 97 fL   MCH 30.5 26.6 - 33.0 pg   MCHC 33.2 31.5 - 35.7 g/dL   RDW 14.0 12.3 - 15.4 %   Platelets 238 150 - 379 x10E3/uL   Neutrophils 58 %   Lymphs 33 %   Monocytes 6 %   Eos 2 %   Basos 1 %   Neutrophils Absolute 3.6 1.4 - 7.0 x10E3/uL   Lymphocytes Absolute 2.0 0.7 - 3.1 x10E3/uL   Monocytes Absolute 0.4 0.1 - 0.9 x10E3/uL   EOS (ABSOLUTE) 0.1 0.0 - 0.4 x10E3/uL   Basophils Absolute 0.1 0.0 - 0.2 x10E3/uL     Immature Granulocytes 0 %   Immature Grans (Abs) 0.0 0.0 - 0.1 x10E3/uL  Cologuard  Result Value Ref Range   Cologuard Negative       Assessment & Plan:   Problem List Items Addressed This Visit      Unprioritized   Anxiety and depression    Continue present medications.  Encouraged sleep hygeine.        Hypercholesteremia    Discussed with pt ASCVD calculator recommends statin.  Refusing at this time      Relevant Orders   Lipid Panel Piccolo, Waived   Hypertension    Monitor BP at home.  DASH diet      Relevant Orders   Comprehensive metabolic panel   Hypothyroidism   Relevant Orders   TSH    Other Visit Diagnoses    Need for influenza vaccination    -  Primary   Relevant Orders   Flu vaccine HIGH DOSE PF (Completed)      Revisit issues in 3 months Follow up plan: Return in about 3 months (around 11/23/2016).

## 2016-08-23 NOTE — Patient Instructions (Addendum)

## 2016-08-23 NOTE — Assessment & Plan Note (Signed)
Monitor BP at home.  DASH diet

## 2016-08-23 NOTE — Assessment & Plan Note (Signed)
Continue present medications.  Encouraged sleep hygeine.

## 2016-08-24 ENCOUNTER — Encounter: Payer: Self-pay | Admitting: Unknown Physician Specialty

## 2016-08-24 LAB — COMPREHENSIVE METABOLIC PANEL
ALT: 11 IU/L (ref 0–32)
AST: 19 IU/L (ref 0–40)
Albumin/Globulin Ratio: 1.7 (ref 1.2–2.2)
Albumin: 4.3 g/dL (ref 3.6–4.8)
Alkaline Phosphatase: 79 IU/L (ref 39–117)
BUN/Creatinine Ratio: 15 (ref 12–28)
BUN: 10 mg/dL (ref 8–27)
Bilirubin Total: 0.4 mg/dL (ref 0.0–1.2)
CALCIUM: 9.2 mg/dL (ref 8.7–10.3)
CO2: 25 mmol/L (ref 18–29)
CREATININE: 0.66 mg/dL (ref 0.57–1.00)
Chloride: 99 mmol/L (ref 96–106)
GFR calc Af Amer: 106 mL/min/{1.73_m2} (ref >59)
GFR, EST NON AFRICAN AMERICAN: 92 mL/min/{1.73_m2} (ref >59)
GLOBULIN, TOTAL: 2.5 g/dL (ref 1.5–4.5)
Glucose: 92 mg/dL (ref 65–99)
Potassium: 4.6 mmol/L (ref 3.5–5.2)
SODIUM: 142 mmol/L (ref 134–144)
TOTAL PROTEIN: 6.8 g/dL (ref 6.0–8.5)

## 2016-08-24 LAB — TSH: TSH: 5.08 u[IU]/mL — ABNORMAL HIGH (ref 0.450–4.500)

## 2016-11-22 ENCOUNTER — Ambulatory Visit (INDEPENDENT_AMBULATORY_CARE_PROVIDER_SITE_OTHER): Payer: Medicare HMO | Admitting: Unknown Physician Specialty

## 2016-11-22 ENCOUNTER — Encounter: Payer: Self-pay | Admitting: Unknown Physician Specialty

## 2016-11-22 DIAGNOSIS — I10 Essential (primary) hypertension: Secondary | ICD-10-CM

## 2016-11-22 DIAGNOSIS — F419 Anxiety disorder, unspecified: Secondary | ICD-10-CM | POA: Diagnosis not present

## 2016-11-22 DIAGNOSIS — E039 Hypothyroidism, unspecified: Secondary | ICD-10-CM | POA: Diagnosis not present

## 2016-11-22 NOTE — Progress Notes (Signed)
BP 132/83 (BP Location: Left Arm, Cuff Size: Small)   Pulse 73   Temp 97.8 F (36.6 C)   Ht 4' 10.1" (1.476 m)   Wt 102 lb 14.4 oz (46.7 kg)   SpO2 98%   BMI 21.43 kg/m    Subjective:    Patient ID: Brandy Chen, female    DOB: 04/07/1951, 66 y.o.   MRN: [id number]  HPI: Brandy Chen is a 66 y.o. female  Chief Complaint  Patient presents with  . Anxiety  . Hypertension   Pt here due to BP being a little high last visit.  She is now checking her BP at home with good numbers.    Hypertension Average home BPs 120's/70's  No problems or lightheadedness No chest pain with exertion or shortness of breath No Edema  Anxiety Without changes from previous Depression screen [facility] 2/9 11/22/2016 08/23/2016 02/16/2016 12/22/2015  Decreased Interest 0 0 0 0  Down, Depressed, Hopeless 0 0 1 0  PHQ - 2 Score 0 0 1 0  Altered sleeping 1 0 - -  Tired, decreased energy 1 0 - -  Change in appetite 1 0 - -  Feeling bad or failure about yourself  0 0 - -  Trouble concentrating 0 0 - -  Moving slowly or fidgety/restless 0 0 - -  Suicidal thoughts 0 0 - -  PHQ-9 Score 3 0 - -   Relevant past medical, surgical, family and social history reviewed and updated as indicated. Interim medical history since our last visit reviewed. Allergies and medications reviewed and updated.  Review of Systems  Per HPI unless specifically indicated above     Objective:    BP 132/83 (BP Location: Left Arm, Cuff Size: Small)   Pulse 73   Temp 97.8 F (36.6 C)   Ht 4' 10.1" (1.476 m)   Wt 102 lb 14.4 oz (46.7 kg)   SpO2 98%   BMI 21.43 kg/m   Wt Readings from Last 3 Encounters:  11/22/16 102 lb 14.4 oz (46.7 kg)  08/23/16 99 lb 6.4 oz (45.1 kg)  02/16/16 102 lb 3.2 oz (46.4 kg)    Physical Exam  Constitutional: She is oriented to person, place, and time. She appears well-developed and well-nourished. No distress.  HENT:  Head: Normocephalic and atraumatic.  Eyes: Conjunctivae and lids are  normal. Right eye exhibits no discharge. Left eye exhibits no discharge. No scleral icterus.  Neck: Normal range of motion. Neck supple. No JVD present. Carotid bruit is not present.  Cardiovascular: Normal rate, regular rhythm and normal heart sounds.   Pulmonary/Chest: Effort normal and breath sounds normal.  Abdominal: Normal appearance. There is no splenomegaly or hepatomegaly.  Musculoskeletal: Normal range of motion.  Neurological: She is alert and oriented to person, place, and time.  Skin: Skin is warm, dry and intact. No rash noted. No pallor.  Psychiatric: She has a normal mood and affect. Her behavior is normal. Judgment and thought content normal.   Assessment & Plan:   Problem List Items Addressed This Visit      Unprioritized   Chronic anxiety    Stable, continue present medications.        Hypertension    Stable numbers at home and here today      Hypothyroidism    Borderline elevated TSH.  Recheck 3 months          Follow up plan: Return in about 3 months (around 02/19/2017).

## 2016-11-22 NOTE — Assessment & Plan Note (Signed)
Stable numbers at home and here today

## 2016-11-22 NOTE — Assessment & Plan Note (Signed)
Stable, continue present medications.   

## 2016-11-22 NOTE — Assessment & Plan Note (Addendum)
Borderline elevated TSH.  Recheck 3 months

## 2017-01-24 DIAGNOSIS — H1045 Other chronic allergic conjunctivitis: Secondary | ICD-10-CM | POA: Diagnosis not present

## 2017-02-04 ENCOUNTER — Telehealth: Payer: Self-pay | Admitting: Unknown Physician Specialty

## 2017-02-04 NOTE — Telephone Encounter (Signed)
Called pt to schedule Annual Wellness Visit with Nurse Health Advisor for May:  - knb ° °

## 2017-02-14 ENCOUNTER — Ambulatory Visit: Payer: Medicare HMO | Admitting: Family Medicine

## 2017-02-17 ENCOUNTER — Ambulatory Visit (INDEPENDENT_AMBULATORY_CARE_PROVIDER_SITE_OTHER): Payer: Medicare HMO

## 2017-02-17 VITALS — BP 116/80 | HR 82 | Temp 98.3°F | Resp 16 | Ht <= 58 in | Wt 103.4 lb

## 2017-02-17 DIAGNOSIS — Z0000 Encounter for general adult medical examination without abnormal findings: Secondary | ICD-10-CM | POA: Diagnosis not present

## 2017-02-17 NOTE — Progress Notes (Signed)
Subjective:   Brandy Chen is a 66 y.o. female who presents for Medicare Annual (Subsequent) preventive examination.  Review of Systems:  Cardiac Risk Factors include: advanced age (>58men, >7 women);hypertension     Objective:     Vitals: BP 116/80 (BP Location: Left Arm, Patient Position: Sitting)   Pulse 82   Temp 98.3 F (36.8 C)   Resp 16   Ht 4\' 10"  (1.473 m)   Wt 103 lb 6.4 oz (46.9 kg)   BMI 21.61 kg/m   Body mass index is 21.61 kg/m.   Tobacco History  Smoking Status  . Never Smoker  Smokeless Tobacco  . Never Used     Counseling given: Not Answered   Past Medical History:  Diagnosis Date  . Anxiety   . Depression   . Endometriosis   . Fatigue   . Hyperlipidemia   . Hypertension   . Insomnia   . Lumbago   . Osteoporosis   . Thyroid disease   . Vitamin D deficiency    Past Surgical History:  Procedure Laterality Date  . CESAREAN SECTION    . pyloric stenosis    . TUBAL LIGATION     Family History  Problem Relation Age of Onset  . Stroke Mother   . Hypertension Mother   . Stroke Maternal Grandfather   . Hypertension Sister    History  Sexual Activity  . Sexual activity: Not Currently    Outpatient Encounter Prescriptions as of 02/17/2017  Medication Sig  . ALPRAZolam (XANAX) 0.5 MG tablet Take 1 tablet (0.5 mg total) by mouth at bedtime.  . Calcium Carb-Cholecalciferol (CALCIUM 600+D) 600-800 MG-UNIT TABS Take 1 tablet by mouth 2 (two) times daily.  Maurine Minister Chelate-Vit D (FOSTEUM) 27-20-200 MG-MG-UNIT CAPS Take one twice a day Generic please  . Krill Oil 300 MG CAPS Take 300 mg by mouth daily.  . Magnesium 500 MG TABS Take 500 mg by mouth daily.  . vitamin C (ASCORBIC ACID) 500 MG tablet Take 500 mg by mouth daily.  . meclizine (ANTIVERT) 25 MG tablet Take 1 tablet (25 mg total) by mouth 3 (three) times daily as needed for dizziness. (Patient not taking: Reported on 02/17/2017)   No facility-administered encounter medications  on file as of 02/17/2017.     Activities of Daily Living In your present state of health, do you have any difficulty performing the following activities: 02/17/2017  Hearing? N  Vision? N  Difficulty concentrating or making decisions? N  Walking or climbing stairs? N  Dressing or bathing? N  Doing errands, shopping? N  Preparing Food and eating ? N  Using the Toilet? N  In the past six months, have you accidently leaked urine? Y  Do you have problems with loss of bowel control? N  Managing your Medications? N  Managing your Finances? N  Housekeeping or managing your Housekeeping? N  Some recent data might be hidden    Patient Care Team: Kathrine Haddock, NP as PCP - General (Nurse Practitioner)    Assessment:     Exercise Activities and Dietary recommendations Current Exercise Habits: Home exercise routine, Type of exercise: walking, Time (Minutes): 30, Frequency (Times/Week): 4, Weekly Exercise (Minutes/Week): 120, Intensity: Mild  Goals    . Increase water intake          Recommend drinking 3-4 glasses of water a day.      Fall Risk Fall Risk  02/17/2017 02/16/2016 12/22/2015  Falls in the past year? No  Yes Yes  Number falls in past yr: - 1 1  Injury with Fall? - No No   Depression Screen PHQ 2/9 Scores 02/17/2017 11/22/2016 08/23/2016 02/16/2016  PHQ - 2 Score 0 0 0 1  PHQ- 9 Score 0 3 0 -     Cognitive Function     6CIT Screen 02/17/2017  What Year? 0 points  What month? 0 points  What time? 0 points  Count back from 20 0 points  Months in reverse 0 points  Repeat phrase 0 points  Total Score 0    Immunization History  Administered Date(s) Administered  . Influenza, High Dose Seasonal PF 08/23/2016  . Influenza-Unspecified 06/27/2015  . Pneumococcal Conjugate-13 02/16/2016   Screening Tests Health Maintenance  Topic Date Due  . PNA vac Low Risk Adult (2 of 2 - PPSV23) 02/15/2017  . Hepatitis C Screening  02/28/2017 (Originally 01/20/1951)  .  TETANUS/TDAP  09/27/2017 (Originally 12/17/1967)  . INFLUENZA VACCINE  04/27/2017  . MAMMOGRAM  04/19/2018  . Fecal DNA (Cologuard)  03/02/2019  . DEXA SCAN  Completed      Plan:    I have personally reviewed and addressed the Medicare Annual Wellness questionnaire and have noted the following in the patient's chart:  A. Medical and social history B. Use of alcohol, tobacco or illicit drugs  C. Current medications and supplements D. Functional ability and status E.  Nutritional status F.  Physical activity G. Advance directives H. List of other physicians I.  Hospitalizations, surgeries, and ER visits in previous 12 months J.  [place] such as hearing and vision if needed, cognitive and depression L. Referrals and appointments - 02/28/2017 at 10am with Regino Schultze  In addition, I have reviewed and discussed with patient certain preventive protocols, quality metrics, and best practice recommendations. A written personalized care plan for preventive services as well as general preventive health recommendations were provided to patient.   Signed,  Tyler Aas, LPN Nurse Health Advisor   MD Recommendations: Patient declined pneumovax 23 vaccine today. She would like to do her Hep C screening on 02/28/17.

## 2017-02-17 NOTE — Patient Instructions (Addendum)
[name] , Thank you for taking time to come for your Medicare Wellness Visit. I appreciate your ongoing commitment to your health goals. Please review the following plan we discussed and let me know if I can assist you in the future.   Screening recommendations/referrals: Colonoscopy: Completed 09/27/2006, cologaurd done in 2017, due 03/02/2019 Mammogram: Completed 04/19/2016, due 04/19/2018 Bone Density: Completed 02/18/2015 Recommended yearly ophthalmology/optometry visit for glaucoma screening and checkup Recommended yearly dental visit for hygiene and checkup  Vaccinations: Influenza vaccine: up to date, due 07/2017 Pneumococcal vaccine: Prevnar 13 done 01/2016, pneumovax 23 due now- declined Tdap vaccine: due, check with your insurance company for coverage  Shingles vaccine: due, check with your insurance company for coverage  Advanced directives: Advance directive discussed with you today. I have provided a copy for you to complete at home and have notarized. Once this is complete please bring a copy in to our office so we can scan it into your chart.  Conditions/risks identified: Recommend drinking 3-4 glasses of water a day.  Next appointment: Follow up with Kathrine Haddock on 02/28/2017 at 10 am. Follow up in one year for your annual wellness exam.    Preventive Care 65 Years and Older, Female Preventive care refers to lifestyle choices and visits with your health care provider that can promote health and wellness. What does preventive care include?  A yearly physical exam. This is also called an annual well check.  Dental exams once or twice a year.  Routine eye exams. Ask your health care provider how often you should have your eyes checked.  Personal lifestyle choices, including:  Daily care of your teeth and gums.  Regular physical activity.  Eating a healthy diet.  Avoiding tobacco and drug use.  Limiting alcohol use.  Practicing safe sex.  Taking low-dose aspirin  every day.  Taking vitamin and mineral supplements as recommended by your health care provider. What happens during an annual well check? The services and screenings done by your health care provider during your annual well check will depend on your age, overall health, lifestyle risk factors, and family history of disease. Counseling  Your health care provider may ask you questions about your:  Alcohol use.  Tobacco use.  Drug use.  Emotional well-being.  Home and relationship well-being.  Sexual activity.  Eating habits.  History of falls.  Memory and ability to understand (cognition).  Work and work Statistician.  Reproductive health. Screening  You may have the following tests or measurements:  Height, weight, and BMI.  Blood pressure.  Lipid and cholesterol levels. These may be checked every 5 years, or more frequently if you are over 17 years old.  Skin check.  Lung cancer screening. You may have this screening every year starting at age 39 if you have a 30-pack-year history of smoking and currently smoke or have quit within the past 15 years.  Fecal occult blood test (FOBT) of the stool. You may have this test every year starting at age 31.  Flexible sigmoidoscopy or colonoscopy. You may have a sigmoidoscopy every 5 years or a colonoscopy every 10 years starting at age 40.  Hepatitis C blood test.  Hepatitis B blood test.  Sexually transmitted disease (STD) testing.  Diabetes screening. This is done by checking your blood sugar (glucose) after you have not eaten for a while (fasting). You may have this done every 1-3 years.  Bone density scan. This is done to screen for osteoporosis. You may have this done  starting at age 62.  Mammogram. This may be done every 1-2 years. Talk to your health care provider about how often you should have regular mammograms. Talk with your health care provider about your test results, treatment options, and if necessary,  the need for more tests. Vaccines  Your health care provider may recommend certain vaccines, such as:  Influenza vaccine. This is recommended every year.  Tetanus, diphtheria, and acellular pertussis (Tdap, Td) vaccine. You may need a Td booster every 10 years.  Zoster vaccine. You may need this after age 20.  Pneumococcal 13-valent conjugate (PCV13) vaccine. One dose is recommended after age 61.  Pneumococcal polysaccharide (PPSV23) vaccine. One dose is recommended after age 27. Talk to your health care provider about which screenings and vaccines you need and how often you need them. This information is not intended to replace advice given to you by your health care provider. Make sure you discuss any questions you have with your health care provider. Document Released: 10/10/2015 Document Revised: 06/02/2016 Document Reviewed: 07/15/2015 Elsevier Interactive Patient Education  2017 [place] Prevention in the Home Falls can cause injuries. They can happen to people of all ages. There are many things you can do to make your home safe and to help prevent falls. What can I do on the outside of my home?  Regularly fix the edges of walkways and driveways and fix any cracks.  Remove anything that might make you trip as you walk through a door, such as a raised step or threshold.  Trim any bushes or trees on the path to your home.  Use bright outdoor lighting.  Clear any walking paths of anything that might make someone trip, such as rocks or tools.  Regularly check to see if handrails are loose or broken. Make sure that both sides of any steps have handrails.  Any raised decks and porches should have guardrails on the edges.  Have any leaves, snow, or ice cleared regularly.  Use sand or salt on walking paths during winter.  Clean up any spills in your garage right away. This includes oil or grease spills. What can I do in the bathroom?  Use night lights.  Install  grab bars by the toilet and in the tub and shower. Do not use towel bars as grab bars.  Use non-skid mats or decals in the tub or shower.  If you need to sit down in the shower, use a plastic, non-slip stool.  Keep the floor dry. Clean up any water that spills on the floor as soon as it happens.  Remove soap buildup in the tub or shower regularly.  Attach bath mats securely with double-sided non-slip rug tape.  Do not have throw rugs and other things on the floor that can make you trip. What can I do in the bedroom?  Use night lights.  Make sure that you have a light by your bed that is easy to reach.  Do not use any sheets or blankets that are too big for your bed. They should not hang down onto the floor.  Have a firm chair that has side arms. You can use this for support while you get dressed.  Do not have throw rugs and other things on the floor that can make you trip. What can I do in the kitchen?  Clean up any spills right away.  Avoid walking on wet floors.  Keep items that you use a lot in easy-to-reach places.  If you need to reach something above you, use a strong step stool that has a grab bar.  Keep electrical cords out of the way.  Do not use floor polish or wax that makes floors slippery. If you must use wax, use non-skid floor wax.  Do not have throw rugs and other things on the floor that can make you trip. What can I do with my stairs?  Do not leave any items on the stairs.  Make sure that there are handrails on both sides of the stairs and use them. Fix handrails that are broken or loose. Make sure that handrails are as long as the stairways.  Check any carpeting to make sure that it is firmly attached to the stairs. Fix any carpet that is loose or worn.  Avoid having throw rugs at the top or bottom of the stairs. If you do have throw rugs, attach them to the floor with carpet tape.  Make sure that you have a light switch at the top of the stairs and  the bottom of the stairs. If you do not have them, ask someone to add them for you. What else can I do to help prevent falls?  Wear shoes that:  Do not have high heels.  Have rubber bottoms.  Are comfortable and fit you well.  Are closed at the toe. Do not wear sandals.  If you use a stepladder:  Make sure that it is fully opened. Do not climb a closed stepladder.  Make sure that both sides of the stepladder are locked into place.  Ask someone to hold it for you, if possible.  Clearly mark and make sure that you can see:  Any grab bars or handrails.  First and last steps.  Where the edge of each step is.  Use tools that help you move around (mobility aids) if they are needed. These include:  Canes.  Walkers.  Scooters.  Crutches.  Turn on the lights when you go into a dark area. Replace any light bulbs as soon as they burn out.  Set up your furniture so you have a clear path. Avoid moving your furniture around.  If any of your floors are uneven, fix them.  If there are any pets around you, be aware of where they are.  Review your medicines with your doctor. Some medicines can make you feel dizzy. This can increase your chance of falling. Ask your doctor what other things that you can do to help prevent falls. This information is not intended to replace advice given to you by your health care provider. Make sure you discuss any questions you have with your health care provider. Document Released: 07/10/2009 Document Revised: 02/19/2016 Document Reviewed: 10/18/2014 Elsevier Interactive Patient Education  2017 [employer].

## 2017-02-28 ENCOUNTER — Ambulatory Visit (INDEPENDENT_AMBULATORY_CARE_PROVIDER_SITE_OTHER): Payer: Medicare HMO | Admitting: Unknown Physician Specialty

## 2017-02-28 ENCOUNTER — Encounter: Payer: Self-pay | Admitting: Unknown Physician Specialty

## 2017-02-28 VITALS — BP 132/70 | HR 63 | Temp 97.8°F | Wt 104.6 lb

## 2017-02-28 DIAGNOSIS — Z0000 Encounter for general adult medical examination without abnormal findings: Secondary | ICD-10-CM

## 2017-02-28 DIAGNOSIS — R030 Elevated blood-pressure reading, without diagnosis of hypertension: Secondary | ICD-10-CM

## 2017-02-28 DIAGNOSIS — I10 Essential (primary) hypertension: Secondary | ICD-10-CM

## 2017-02-28 MED ORDER — ALPRAZOLAM 0.5 MG PO TABS: 0.5000 mg | ORAL_TABLET | Freq: Every day | ORAL | 3 refills | Status: DC

## 2017-02-28 NOTE — Assessment & Plan Note (Addendum)
White coat for now.  Second BP OK.   Will continue home monitoring.

## 2017-02-28 NOTE — Progress Notes (Signed)
BP 132/70   Pulse 63   Temp 97.8 F (36.6 C)   Wt 104 lb 9.6 oz (47.4 kg)   SpO2 99%   BMI 21.86 kg/m    Subjective:    Patient ID: Brandy Chen, female    DOB: 1951/02/21, 66 y.o.   MRN: [id number]  HPI: Brandy Chen is a 66 y.o. female  Chief Complaint  Patient presents with  . Annual Exam   Reviewed nurse health advisors note Hypertension Not taking any medications at this time.   Average home BPs 120's/70 at home  No problems or lightheadedness No chest pain with exertion or shortness of breath No Edema  Insomnia Takes Xanax at night which she has been taking for over 20 years and has stayed on the same dose    Past Medical History:  Diagnosis Date  . Anxiety   . Depression   . Endometriosis   . Fatigue   . Hyperlipidemia   . Hypertension   . Insomnia   . Lumbago   . Osteoporosis   . Thyroid disease   . Vitamin D deficiency    Past Surgical History:  Procedure Laterality Date  . CESAREAN SECTION    . pyloric stenosis    . TUBAL LIGATION     Family History  Problem Relation Age of Onset  . Stroke Mother   . Hypertension Mother   . Stroke Maternal Grandfather   . Hypertension Sister    Social History   Social History  . Marital status: Married    Spouse name: N/A  . Number of children: N/A  . Years of education: N/A   Social History Main Topics  . Smoking status: Never Smoker  . Smokeless tobacco: Never Used  . Alcohol use No  . Drug use: No  . Sexual activity: Not Currently   Other Topics Concern  . None   Social History Narrative  . None     Relevant past medical, surgical, family and social history reviewed and updated as indicated. Interim medical history since our last visit reviewed. Allergies and medications reviewed and updated.  Review of Systems  Constitutional: Negative.   HENT: Negative.   Eyes: Negative.   Respiratory: Negative.   Cardiovascular: Negative.   Gastrointestinal: Negative.   Endocrine:  Negative.   Genitourinary: Negative.   Musculoskeletal: Negative.   Skin: Negative.   Allergic/Immunologic: Negative.   Neurological: Negative.   Hematological: Negative.   Psychiatric/Behavioral: Negative.     Per HPI unless specifically indicated above     Objective:    BP 132/70   Pulse 63   Temp 97.8 F (36.6 C)   Wt 104 lb 9.6 oz (47.4 kg)   SpO2 99%   BMI 21.86 kg/m   Wt Readings from Last 3 Encounters:  02/28/17 104 lb 9.6 oz (47.4 kg)  02/17/17 103 lb 6.4 oz (46.9 kg)  11/22/16 102 lb 14.4 oz (46.7 kg)    Physical Exam  Constitutional: She is oriented to person, place, and time. She appears well-developed and well-nourished.  HENT:  Head: Normocephalic and atraumatic.  Eyes: Pupils are equal, round, and reactive to light. Right eye exhibits no discharge. Left eye exhibits no discharge. No scleral icterus.  Neck: Normal range of motion. Neck supple. Carotid bruit is not present. No thyromegaly present.  Cardiovascular: Normal rate, regular rhythm and normal heart sounds.  Exam reveals no gallop and no friction rub.   No murmur heard. Pulmonary/Chest: Effort normal and breath  sounds normal. No respiratory distress. She has no wheezes. She has no rales.  Abdominal: Soft. Bowel sounds are normal. There is no tenderness. There is no rebound.  Genitourinary: No breast swelling, tenderness or discharge.  Musculoskeletal: Normal range of motion.  Lymphadenopathy:    She has no cervical adenopathy.  Neurological: She is alert and oriented to person, place, and time.  Skin: Skin is warm, dry and intact. No rash noted.  Psychiatric: She has a normal mood and affect. Her speech is normal and behavior is normal. Judgment and thought content normal. Cognition and memory are normal.    Results for orders placed or performed in visit on 08/23/16  TSH  Result Value Ref Range   TSH 5.080 (H) 0.450 - 4.500 uIU/mL  Comprehensive metabolic panel  Result Value Ref Range    Glucose 92 65 - 99 mg/dL   BUN 10 8 - 27 mg/dL   Creatinine, Ser 0.66 0.57 - 1.00 mg/dL   GFR calc non Af Amer 92 >59 mL/min/1.73   GFR calc Af Amer 106 >59 mL/min/1.73   BUN/Creatinine Ratio 15 12 - 28   Sodium 142 134 - 144 mmol/L   Potassium 4.6 3.5 - 5.2 mmol/L   Chloride 99 96 - 106 mmol/L   CO2 25 18 - 29 mmol/L   Calcium 9.2 8.7 - 10.3 mg/dL   Total Protein 6.8 6.0 - 8.5 g/dL   Albumin 4.3 3.6 - 4.8 g/dL   Globulin, Total 2.5 1.5 - 4.5 g/dL   Albumin/Globulin Ratio 1.7 1.2 - 2.2   Bilirubin Total 0.4 0.0 - 1.2 mg/dL   Alkaline Phosphatase 79 39 - 117 IU/L   AST 19 0 - 40 IU/L   ALT 11 0 - 32 IU/L  Lipid Panel Piccolo, Waived  Result Value Ref Range   Cholesterol Piccolo, Waived 234 (H) <200 mg/dL   HDL Chol Piccolo, Waived 75 >59 mg/dL   Triglycerides Piccolo,Waived 106 <150 mg/dL   Chol/HDL Ratio Piccolo,Waive 3.1 mg/dL   LDL Chol Calc Piccolo Waived 138 (H) <100 mg/dL   VLDL Chol Calc Piccolo,Waive 21 <30 mg/dL      Assessment & Plan:   Problem List Items Addressed This Visit      Unprioritized   Hypertension    White coat for now.  Second BP OK.   Will continue home monitoring.        Relevant Medications   aspirin EC 81 MG tablet    Other Visit Diagnoses    Annual physical exam    -  Primary       Follow up plan: Return in about 6 months (around 08/30/2017).

## 2017-08-04 ENCOUNTER — Ambulatory Visit (INDEPENDENT_AMBULATORY_CARE_PROVIDER_SITE_OTHER): Payer: Medicare HMO

## 2017-08-04 DIAGNOSIS — Z23 Encounter for immunization: Secondary | ICD-10-CM | POA: Diagnosis not present

## 2017-08-21 ENCOUNTER — Other Ambulatory Visit: Payer: Self-pay | Admitting: Unknown Physician Specialty

## 2017-08-25 ENCOUNTER — Other Ambulatory Visit: Payer: Self-pay | Admitting: Unknown Physician Specialty

## 2017-08-26 NOTE — Telephone Encounter (Signed)
Request for controlled substance 

## 2017-08-30 DIAGNOSIS — H2513 Age-related nuclear cataract, bilateral: Secondary | ICD-10-CM | POA: Diagnosis not present

## 2017-09-05 ENCOUNTER — Ambulatory Visit: Payer: Medicare HMO | Admitting: Unknown Physician Specialty

## 2017-09-12 ENCOUNTER — Encounter: Payer: Self-pay | Admitting: Unknown Physician Specialty

## 2017-09-12 ENCOUNTER — Ambulatory Visit: Payer: Medicare HMO | Admitting: Unknown Physician Specialty

## 2017-09-12 DIAGNOSIS — I10 Essential (primary) hypertension: Secondary | ICD-10-CM

## 2017-09-12 DIAGNOSIS — F5101 Primary insomnia: Secondary | ICD-10-CM | POA: Diagnosis not present

## 2017-09-12 NOTE — Assessment & Plan Note (Signed)
Takes nightly Xanax.  Doesn't feel she is able to stop at this time.  Lots of stress with her dog.

## 2017-09-12 NOTE — Progress Notes (Signed)
BP 128/83 (BP Location: Left Arm, Cuff Size: Small)   Pulse 74   Temp 97.9 F (36.6 C) (Oral)   Ht 4' 11.1" (1.501 m)   Wt 100 lb 9.6 oz (45.6 kg)   SpO2 98%   BMI 20.25 kg/m    Subjective:    Patient ID: Brandy Chen, female    DOB: 1951/08/05, 66 y.o.   MRN: [id number]  HPI: Brandy Chen is a 66 y.o. female  Chief Complaint  Patient presents with  . Anxiety  . Hypertension   Hypertension Using medications without difficulty Average home BPs   No problems or lightheadedness No chest pain with exertion or shortness of breath No Edema  The 10-year ASCVD risk score [name], et al., 2013) is: 7.5%   Values used to calculate the score:     Age: 12 years     Sex: Female     Is Non-Hispanic African American: No     Diabetic: No     Tobacco smoker: No     Systolic Blood Pressure: 174 mmHg     Is BP treated: No     HDL Cholesterol: 75 mg/dL     Total Cholesterol: 234 mg/dL  Insomnia Takes nightly Xanax for insomnia.    Relevant past medical, surgical, family and social history reviewed and updated as indicated. Interim medical history since our last visit reviewed. Allergies and medications reviewed and updated.  Review of Systems  Constitutional: Negative.   Respiratory: Negative.   Cardiovascular: Negative.   Psychiatric/Behavioral: Negative.     Per HPI unless specifically indicated above     Objective:    BP 128/83 (BP Location: Left Arm, Cuff Size: Small)   Pulse 74   Temp 97.9 F (36.6 C) (Oral)   Ht 4' 11.1" (1.501 m)   Wt 100 lb 9.6 oz (45.6 kg)   SpO2 98%   BMI 20.25 kg/m   Wt Readings from Last 3 Encounters:  09/12/17 100 lb 9.6 oz (45.6 kg)  02/28/17 104 lb 9.6 oz (47.4 kg)  02/17/17 103 lb 6.4 oz (46.9 kg)    Physical Exam  Constitutional: She is oriented to person, place, and time. She appears well-developed and well-nourished. No distress.  HENT:  Head: Normocephalic and atraumatic.  Eyes: Conjunctivae and lids are normal.  Right eye exhibits no discharge. Left eye exhibits no discharge. No scleral icterus.  Neck: Normal range of motion. Neck supple. No JVD present. Carotid bruit is not present.  Cardiovascular: Normal rate, regular rhythm and normal heart sounds.  Pulmonary/Chest: Effort normal and breath sounds normal.  Abdominal: Normal appearance. There is no splenomegaly or hepatomegaly.  Musculoskeletal: Normal range of motion.  Neurological: She is alert and oriented to person, place, and time.  Skin: Skin is warm, dry and intact. No rash noted. No pallor.  Psychiatric: She has a normal mood and affect. Her behavior is normal. Judgment and thought content normal.    Results for orders placed or performed in visit on 08/23/16  TSH  Result Value Ref Range   TSH 5.080 (H) 0.450 - 4.500 uIU/mL  Comprehensive metabolic panel  Result Value Ref Range   Glucose 92 65 - 99 mg/dL   BUN 10 8 - 27 mg/dL   Creatinine, Ser 0.66 0.57 - 1.00 mg/dL   GFR calc non Af Amer 92 >59 mL/min/1.73   GFR calc Af Amer 106 >59 mL/min/1.73   BUN/Creatinine Ratio 15 12 - 28   Sodium 142  134 - 144 mmol/L   Potassium 4.6 3.5 - 5.2 mmol/L   Chloride 99 96 - 106 mmol/L   CO2 25 18 - 29 mmol/L   Calcium 9.2 8.7 - 10.3 mg/dL   Total Protein 6.8 6.0 - 8.5 g/dL   Albumin 4.3 3.6 - 4.8 g/dL   Globulin, Total 2.5 1.5 - 4.5 g/dL   Albumin/Globulin Ratio 1.7 1.2 - 2.2   Bilirubin Total 0.4 0.0 - 1.2 mg/dL   Alkaline Phosphatase 79 39 - 117 IU/L   AST 19 0 - 40 IU/L   ALT 11 0 - 32 IU/L  Lipid Panel Piccolo, Waived  Result Value Ref Range   Cholesterol Piccolo, Waived 234 (H) <200 mg/dL   HDL Chol Piccolo, Waived 75 >59 mg/dL   Triglycerides Piccolo,Waived 106 <150 mg/dL   Chol/HDL Ratio Piccolo,Waive 3.1 mg/dL   LDL Chol Calc Piccolo Waived 138 (H) <100 mg/dL   VLDL Chol Calc Piccolo,Waive 21 <30 mg/dL      Assessment & Plan:   Problem List Items Addressed This Visit      Unprioritized   Hypertension    Stable today.   Well known white coat.  Will check it more at home       Insomnia    Takes nightly Xanax.  Doesn't feel she is able to stop at this time.  Lots of stress with her dog.            Follow up plan: Return in about 6 months (around 03/13/2018) for physical.

## 2017-09-12 NOTE — Assessment & Plan Note (Signed)
Stable today.  Well known white coat.  Will check it more at home

## 2018-03-20 ENCOUNTER — Ambulatory Visit (INDEPENDENT_AMBULATORY_CARE_PROVIDER_SITE_OTHER): Payer: Medicare HMO

## 2018-03-20 VITALS — BP 138/80 | HR 84 | Temp 98.4°F | Resp 16 | Ht <= 58 in | Wt 97.0 lb

## 2018-03-20 DIAGNOSIS — Z0000 Encounter for general adult medical examination without abnormal findings: Secondary | ICD-10-CM | POA: Diagnosis not present

## 2018-03-20 DIAGNOSIS — Z1239 Encounter for other screening for malignant neoplasm of breast: Secondary | ICD-10-CM

## 2018-03-20 DIAGNOSIS — Z1159 Encounter for screening for other viral diseases: Secondary | ICD-10-CM | POA: Diagnosis not present

## 2018-03-20 DIAGNOSIS — Z1231 Encounter for screening mammogram for malignant neoplasm of breast: Secondary | ICD-10-CM | POA: Diagnosis not present

## 2018-03-20 NOTE — Patient Instructions (Addendum)
Brandy Chen , Thank you for taking time to come for your Medicare Wellness Visit. I appreciate your ongoing commitment to your health goals. Please review the following plan we discussed and let me know if I can assist you in the future.   Screening recommendations/referrals: Colonoscopy: Cologaurd done in 2017, due 03/02/2019  Mammogram: Completed 04/19/2016, due 04/19/2018 Please call [phone] to schedule your mammogram.  Bone Density: Completed 02/18/2015 Recommended yearly ophthalmology/optometry visit for glaucoma screening and checkup Recommended yearly dental visit for hygiene and checkup  Vaccinations: Influenza vaccine: up to date, due 05/2018 Pneumococcal vaccine: Prevnar 13 done 01/2016, pneumovax 23 due now- declined  Tdap vaccine: due, check with your insurance company for coverage  Shingles vaccine: shingrix eligible, check with your insurance company for coverage   Advanced directives: Advance directive discussed with you today. I have provided a copy for you to complete at home and have notarized. Once this is complete please bring a copy in to our office so we can scan it into your chart.  Conditions/risks identified: Recommend drinking 6-8 glasses of water a day.  Next appointment: Follow up with Kathrine Haddock on 04/03/2018 at 10 am. Follow up in one year for your annual wellness exam.     Preventive Care 65 Years and Older, Female Preventive care refers to lifestyle choices and visits with your health care provider that can promote health and wellness. What does preventive care include?  A yearly physical exam. This is also called an annual well check.  Dental exams once or twice a year.  Routine eye exams. Ask your health care provider how often you should have your eyes checked.  Personal lifestyle choices, including:  Daily care of your teeth and gums.  Regular physical activity.  Eating a healthy diet.  Avoiding tobacco and drug use.  Limiting alcohol  use.  Practicing safe sex.  Taking low-dose aspirin every day.  Taking vitamin and mineral supplements as recommended by your health care provider. What happens during an annual well check? The services and screenings done by your health care provider during your annual well check will depend on your age, overall health, lifestyle risk factors, and family history of disease. Counseling  Your health care provider may ask you questions about your:  Alcohol use.  Tobacco use.  Drug use.  Emotional well-being.  Home and relationship well-being.  Sexual activity.  Eating habits.  History of falls.  Memory and ability to understand (cognition).  Work and work Statistician.  Reproductive health. Screening  You may have the following tests or measurements:  Height, weight, and BMI.  Blood pressure.  Lipid and cholesterol levels. These may be checked every 5 years, or more frequently if you are over 44 years old.  Skin check.  Lung cancer screening. You may have this screening every year starting at age 15 if you have a 30-pack-year history of smoking and currently smoke or have quit within the past 15 years.  Fecal occult blood test (FOBT) of the stool. You may have this test every year starting at age 52.  Flexible sigmoidoscopy or colonoscopy. You may have a sigmoidoscopy every 5 years or a colonoscopy every 10 years starting at age 26.  Hepatitis C blood test.  Hepatitis B blood test.  Sexually transmitted disease (STD) testing.  Diabetes screening. This is done by checking your blood sugar (glucose) after you have not eaten for a while (fasting). You may have this done every 1-3 years.  Bone density scan. This  is done to screen for osteoporosis. You may have this done starting at age 90.  Mammogram. This may be done every 1-2 years. Talk to your health care provider about how often you should have regular mammograms. Talk with your health care provider about  your test results, treatment options, and if necessary, the need for more tests. Vaccines  Your health care provider may recommend certain vaccines, such as:  Influenza vaccine. This is recommended every year.  Tetanus, diphtheria, and acellular pertussis (Tdap, Td) vaccine. You may need a Td booster every 10 years.  Zoster vaccine. You may need this after age 36.  Pneumococcal 13-valent conjugate (PCV13) vaccine. One dose is recommended after age 30.  Pneumococcal polysaccharide (PPSV23) vaccine. One dose is recommended after age 79. Talk to your health care provider about which screenings and vaccines you need and how often you need them. This information is not intended to replace advice given to you by your health care provider. Make sure you discuss any questions you have with your health care provider. Document Released: 10/10/2015 Document Revised: 06/02/2016 Document Reviewed: 07/15/2015 Elsevier Interactive Patient Education  2017 [place] Prevention in the Home Falls can cause injuries. They can happen to people of all ages. There are many things you can do to make your home safe and to help prevent falls. What can I do on the outside of my home?  Regularly fix the edges of walkways and driveways and fix any cracks.  Remove anything that might make you trip as you walk through a door, such as a raised step or threshold.  Trim any bushes or trees on the path to your home.  Use bright outdoor lighting.  Clear any walking paths of anything that might make someone trip, such as rocks or tools.  Regularly check to see if handrails are loose or broken. Make sure that both sides of any steps have handrails.  Any raised decks and porches should have guardrails on the edges.  Have any leaves, snow, or ice cleared regularly.  Use sand or salt on walking paths during winter.  Clean up any spills in your garage right away. This includes oil or grease spills. What  can I do in the bathroom?  Use night lights.  Install grab bars by the toilet and in the tub and shower. Do not use towel bars as grab bars.  Use non-skid mats or decals in the tub or shower.  If you need to sit down in the shower, use a plastic, non-slip stool.  Keep the floor dry. Clean up any water that spills on the floor as soon as it happens.  Remove soap buildup in the tub or shower regularly.  Attach bath mats securely with double-sided non-slip rug tape.  Do not have throw rugs and other things on the floor that can make you trip. What can I do in the bedroom?  Use night lights.  Make sure that you have a light by your bed that is easy to reach.  Do not use any sheets or blankets that are too big for your bed. They should not hang down onto the floor.  Have a firm chair that has side arms. You can use this for support while you get dressed.  Do not have throw rugs and other things on the floor that can make you trip. What can I do in the kitchen?  Clean up any spills right away.  Avoid walking on wet floors.  Keep items that you use a lot in easy-to-reach places.  If you need to reach something above you, use a strong step stool that has a grab bar.  Keep electrical cords out of the way.  Do not use floor polish or wax that makes floors slippery. If you must use wax, use non-skid floor wax.  Do not have throw rugs and other things on the floor that can make you trip. What can I do with my stairs?  Do not leave any items on the stairs.  Make sure that there are handrails on both sides of the stairs and use them. Fix handrails that are broken or loose. Make sure that handrails are as long as the stairways.  Check any carpeting to make sure that it is firmly attached to the stairs. Fix any carpet that is loose or worn.  Avoid having throw rugs at the top or bottom of the stairs. If you do have throw rugs, attach them to the floor with carpet tape.  Make sure  that you have a light switch at the top of the stairs and the bottom of the stairs. If you do not have them, ask someone to add them for you. What else can I do to help prevent falls?  Wear shoes that:  Do not have high heels.  Have rubber bottoms.  Are comfortable and fit you well.  Are closed at the toe. Do not wear sandals.  If you use a stepladder:  Make sure that it is fully opened. Do not climb a closed stepladder.  Make sure that both sides of the stepladder are locked into place.  Ask someone to hold it for you, if possible.  Clearly mark and make sure that you can see:  Any grab bars or handrails.  First and last steps.  Where the edge of each step is.  Use tools that help you move around (mobility aids) if they are needed. These include:  Canes.  Walkers.  Scooters.  Crutches.  Turn on the lights when you go into a dark area. Replace any light bulbs as soon as they burn out.  Set up your furniture so you have a clear path. Avoid moving your furniture around.  If any of your floors are uneven, fix them.  If there are any pets around you, be aware of where they are.  Review your medicines with your doctor. Some medicines can make you feel dizzy. This can increase your chance of falling. Ask your doctor what other things that you can do to help prevent falls. This information is not intended to replace advice given to you by your health care provider. Make sure you discuss any questions you have with your health care provider. Document Released: 07/10/2009 Document Revised: 02/19/2016 Document Reviewed: 10/18/2014 Elsevier Interactive Patient Education  2017 [employer].

## 2018-03-20 NOTE — Progress Notes (Signed)
Subjective:   Brandy Chen is a 67 y.o. female who presents for Medicare Annual (Subsequent) preventive examination.  Review of Systems:   Cardiac Risk Factors include: advanced age (>68men, >97 women);hypertension     Objective:     Vitals: BP 138/80 (BP Location: Left Arm, Patient Position: Sitting)   Pulse 84   Temp 98.4 F (36.9 C) (Temporal)   Resp 16   Ht 4' 9.5" (1.461 m)   Wt 97 lb (44 kg)   BMI 20.63 kg/m   Body mass index is 20.63 kg/m.  Advanced Directives 03/20/2018 02/17/2017 02/16/2016  Does Patient Have a Medical Advance Directive? No No No  Would patient like information on creating a medical advance directive? Yes (MAU/Ambulatory/Procedural Areas - Information given) Yes (MAU/Ambulatory/Procedural Areas - Information given) -    Tobacco Social History   Tobacco Use  Smoking Status Never Smoker  Smokeless Tobacco Never Used     Counseling given: Not Answered   Clinical Intake:  Pre-visit preparation completed: Yes  Pain : No/denies pain     Nutritional Status: BMI of 19-24  Normal Nutritional Risks: None Diabetes: No  How often do you need to have someone help you when you read instructions, pamphlets, or other written materials from your doctor or pharmacy?: 1 - Never What is the last grade level you completed in school?: 12th grade  Interpreter Needed?: No  Information entered by :: Nalani Andreen,LPN   Past Medical History:  Diagnosis Date  . Anxiety   . Depression   . Endometriosis   . Fatigue   . Hyperlipidemia   . Hypertension   . Insomnia   . Lumbago   . Osteoporosis   . Thyroid disease   . Vitamin D deficiency    Past Surgical History:  Procedure Laterality Date  . CESAREAN SECTION    . pyloric stenosis    . TUBAL LIGATION     Family History  Problem Relation Age of Onset  . Stroke Mother   . Hypertension Mother   . Stroke Maternal Grandfather   . Hypertension Sister    Social History   Socioeconomic History    . Marital status: Married    Spouse name: Not on file  . Number of children: Not on file  . Years of education: Not on file  . Highest education level: Not on file  Occupational History  . Not on file  Social Needs  . Financial resource strain: Not hard at all  . Food insecurity:    Worry: Never true    Inability: Never true  . Transportation needs:    Medical: No    Non-medical: No  Tobacco Use  . Smoking status: Never Smoker  . Smokeless tobacco: Never Used  Substance and Sexual Activity  . Alcohol use: No    Alcohol/week: 0.0 oz  . Drug use: No  . Sexual activity: Not Currently  Lifestyle  . Physical activity:    Days per week: 5 days    Minutes per session: Not on file  . Stress: Not at all  Relationships  . Social connections:    Talks on phone: More than three times a week    Gets together: Three times a week    Attends religious service: Never    Active member of club or organization: No    Attends meetings of clubs or organizations: Never    Relationship status: Married  Other Topics Concern  . Not on file  Social History  Narrative  . Not on file    Outpatient Encounter Medications as of 03/20/2018  Medication Sig  . ALPRAZolam (XANAX) 0.5 MG tablet TAKE 1 TABLET BY MOUTH AT BEDTIME  . aspirin EC 81 MG tablet Take 81 mg by mouth.  . Calcium Carb-Cholecalciferol (CALCIUM 600+D) 600-800 MG-UNIT TABS Take 1 tablet by mouth 2 (two) times daily.  Javier Docker Oil 300 MG CAPS Take 300 mg by mouth daily.  . Magnesium 500 MG TABS Take 500 mg by mouth daily.  . meclizine (ANTIVERT) 25 MG tablet Take 1 tablet (25 mg total) by mouth 3 (three) times daily as needed for dizziness.  . vitamin C (ASCORBIC ACID) 500 MG tablet Take 500 mg by mouth daily.  Maurine Minister Chelate-Vit D (FOSTEUM) 27-20-200 MG-MG-UNIT CAPS Take one twice a day Generic please (Patient not taking: Reported on 03/20/2018)   No facility-administered encounter medications on file as of 03/20/2018.      Activities of Daily Living In your present state of health, do you have any difficulty performing the following activities: 03/20/2018  Hearing? N  Vision? N  Difficulty concentrating or making decisions? Y  Walking or climbing stairs? N  Dressing or bathing? N  Doing errands, shopping? N  Preparing Food and eating ? N  Using the Toilet? N  In the past six months, have you accidently leaked urine? N  Do you have problems with loss of bowel control? N  Managing your Medications? N  Managing your Finances? N  Housekeeping or managing your Housekeeping? N  Some recent data might be hidden    Patient Care Team: Kathrine Haddock, NP as PCP - General (Nurse Practitioner)    Assessment:   This is a routine wellness examination for [place].  Exercise Activities and Dietary recommendations Current Exercise Habits: Home exercise routine, Type of exercise: walking, Time (Minutes): 15, Frequency (Times/Week): 7, Weekly Exercise (Minutes/Week): 105, Intensity: Mild, Exercise limited by: None identified  Goals    . DIET - INCREASE WATER INTAKE     Recommend drinking at least 6-8 glasses of water a day        Fall Risk Fall Risk  03/20/2018 09/12/2017 02/17/2017 02/16/2016 12/22/2015  Falls in the past year? No No No Yes Yes  Number falls in past yr: - - - 1 1  Injury with Fall? - - - No No   Is the patient's home free of loose throw rugs in walkways, pet beds, electrical cords, etc?   yes      Grab bars in the bathroom? no      Handrails on the stairs?   no      Adequate lighting?   yes  Timed Get Up and Go performed: Completed in 8 seconds with no use of assistive devices, steady gait. No intervention needed at this time.   Depression Screen PHQ 2/9 Scores 03/20/2018 09/12/2017 02/17/2017 11/22/2016  PHQ - 2 Score 0 1 0 0  PHQ- 9 Score - 1 0 3     Cognitive Function     6CIT Screen 03/20/2018 02/17/2017  What Year? 0 points 0 points  What month? 0 points 0 points  What time? 0  points 0 points  Count back from 20 0 points 0 points  Months in reverse 0 points 0 points  Repeat phrase 0 points 0 points  Total Score 0 0    Immunization History  Administered Date(s) Administered  . Influenza, High Dose Seasonal PF 08/23/2016, 08/04/2017  . Influenza-Unspecified 06/27/2015  .  Pneumococcal Conjugate-13 02/16/2016    Qualifies for Shingles Vaccine? Yes,discussed shingrix vaccine   Screening Tests Health Maintenance  Topic Date Due  . Hepatitis C Screening  [birth year]  . TETANUS/TDAP  12/17/1967  . PNA vac Low Risk Adult (2 of 2 - PPSV23) 02/15/2017  . MAMMOGRAM  04/19/2018  . INFLUENZA VACCINE  04/27/2018  . Fecal DNA (Cologuard)  03/02/2019  . DEXA SCAN  Completed    Cancer Screenings: Lung: Low Dose CT Chest recommended if Age 62-80 years, 30 pack-year currently smoking OR have quit w/in 15years. Patient does not qualify. Breast:  Up to date on Mammogram? Yes  04/19/2016 Up to date of Bone Density/Dexa? Yes 02/18/2015 Colorectal: cologuard completed 03/01/2016  Additional Screenings:  Hepatitis C Screening: due, will order for future labs      Plan:    I have personally reviewed and addressed the Medicare Annual Wellness questionnaire and have noted the following in the patient's chart:  A. Medical and social history B. Use of alcohol, tobacco or illicit drugs  C. Current medications and supplements D. Functional ability and status E.  Nutritional status F.  Physical activity G. Advance directives H. List of other physicians I.  Hospitalizations, surgeries, and ER visits in previous 12 months J.  [place] such as hearing and vision if needed, cognitive and depression L. Referrals and appointments   In addition, I have reviewed and discussed with patient certain preventive protocols, quality metrics, and best practice recommendations. A written personalized care plan for preventive services as well as general preventive health  recommendations were provided to patient.   Signed,  Tyler Aas, LPN Nurse Health Advisor   Nurse Notes:none

## 2018-04-03 ENCOUNTER — Encounter: Payer: Self-pay | Admitting: Unknown Physician Specialty

## 2018-04-03 ENCOUNTER — Ambulatory Visit (INDEPENDENT_AMBULATORY_CARE_PROVIDER_SITE_OTHER): Payer: Medicare HMO | Admitting: Unknown Physician Specialty

## 2018-04-03 VITALS — BP 160/85 | HR 58 | Temp 97.4°F | Ht <= 58 in | Wt 98.0 lb

## 2018-04-03 DIAGNOSIS — F419 Anxiety disorder, unspecified: Secondary | ICD-10-CM | POA: Diagnosis not present

## 2018-04-03 DIAGNOSIS — I10 Essential (primary) hypertension: Secondary | ICD-10-CM | POA: Diagnosis not present

## 2018-04-03 DIAGNOSIS — Z0001 Encounter for general adult medical examination with abnormal findings: Secondary | ICD-10-CM | POA: Diagnosis not present

## 2018-04-03 DIAGNOSIS — Z1159 Encounter for screening for other viral diseases: Secondary | ICD-10-CM | POA: Diagnosis not present

## 2018-04-03 DIAGNOSIS — Z0000 Encounter for general adult medical examination without abnormal findings: Secondary | ICD-10-CM

## 2018-04-03 DIAGNOSIS — E039 Hypothyroidism, unspecified: Secondary | ICD-10-CM

## 2018-04-03 MED ORDER — ALPRAZOLAM 0.5 MG PO TABS: 0.5000 mg | ORAL_TABLET | Freq: Every day | ORAL | 2 refills | Status: DC

## 2018-04-03 NOTE — Progress Notes (Addendum)
BP (!) 160/85 (BP Location: Left Arm, Cuff Size: Small)   Pulse (!) 58   Temp (!) 97.4 F (36.3 C) (Oral)   Ht 4' 9.5" (1.461 m)   Wt 98 lb (44.5 kg)   SpO2 100%   BMI 20.84 kg/m    Subjective:    Patient ID: Brandy Chen, female    DOB: 1951/07/04, 67 y.o.   MRN: [id number]  HPI: Brandy Chen is a 67 y.o. female  Chief Complaint  Patient presents with  . Annual Exam    pt had wellness exam 03/20/18  . 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  Taking Xanax nightly for many years.    Depression screen [facility] 2/9 04/03/2018 03/20/2018 09/12/2017 02/17/2017 11/22/2016  Decreased Interest 0 0 0 0 0  Down, Depressed, Hopeless 0 0 1 0 0  PHQ - 2 Score 0 0 1 0 0  Altered sleeping 0 - 0 0 1  Tired, decreased energy 0 - 0 0 1  Change in appetite 0 - 0 0 1  Feeling bad or failure about yourself  0 - 0 0 0  Trouble concentrating 0 - 0 0 0  Moving slowly or fidgety/restless 0 - 0 0 0  Suicidal thoughts 0 - 0 0 0  PHQ-9 Score 0 - 1 0 3   Hypertension Pt states this was below 130/80.   No problems or lightheadedness No chest pain with exertion or shortness of breath No Edema  Social History   Socioeconomic History  . Marital status: Married    Spouse name: Not on file  . Number of children: Not on file  . Years of education: Not on file  . Highest education level: Not on file  Occupational History  . Not on file  Social Needs  . Financial resource strain: Not hard at all  . Food insecurity:    Worry: Never true    Inability: Never true  . Transportation needs:    Medical: No    Non-medical: No  Tobacco Use  . Smoking status: Never Smoker  . Smokeless tobacco: Never Used  Substance and Sexual Activity  . Alcohol use: No    Alcohol/week: 0.0 oz  . Drug use: No  . Sexual activity: Not Currently  Lifestyle  . Physical  activity:    Days per week: 5 days    Minutes per session: Not on file  . Stress: Not at all  Relationships  . Social connections:    Talks on phone: More than three times a week    Gets together: Three times a week    Attends religious service: Never    Active member of club or organization: No    Attends meetings of clubs or organizations: Never    Relationship status: Married  . Intimate partner violence:    Fear of current or ex partner: No    Emotionally abused: No    Physically abused: No    Forced sexual activity: No  Other Topics Concern  . Not on file  Social History Narrative  . Not on file   Family History  Problem Relation Age of Onset  . Stroke Mother   . Hypertension Mother   . Stroke Maternal Grandfather   . Hypertension Sister  Past Medical History:  Diagnosis Date  . Anxiety   . Depression   . Endometriosis   . Fatigue   . Hyperlipidemia   . Hypertension   . Insomnia   . Lumbago   . Osteoporosis   . Thyroid disease   . Vitamin D deficiency    Past Surgical History:  Procedure Laterality Date  . CESAREAN SECTION    . pyloric stenosis    . TUBAL LIGATION      Relevant past medical, surgical, family and social history reviewed and updated as indicated. Interim medical history since our last visit reviewed. Allergies and medications reviewed and updated.  Review of Systems  Constitutional: Negative.   HENT: Negative.   Eyes: Negative.   Respiratory: Negative.   Cardiovascular: Negative.   Gastrointestinal: Negative.   Endocrine: Negative.   Genitourinary: Negative.   Musculoskeletal: Negative.   Skin: Negative.   Allergic/Immunologic: Negative.   Neurological: Negative.   Hematological: Negative.   Psychiatric/Behavioral: Negative.     Per HPI unless specifically indicated above     Objective:    BP (!) 160/85 (BP Location: Left Arm, Cuff Size: Small)   Pulse (!) 58   Temp (!) 97.4 F (36.3 C) (Oral)   Ht 4' 9.5" (1.461 m)    Wt 98 lb (44.5 kg)   SpO2 100%   BMI 20.84 kg/m   Wt Readings from Last 3 Encounters:  04/03/18 98 lb (44.5 kg)  03/20/18 97 lb (44 kg)  09/12/17 100 lb 9.6 oz (45.6 kg)    Physical Exam  Constitutional: She is oriented to person, place, and time. She appears well-developed and well-nourished.  HENT:  Head: Normocephalic and atraumatic.  Eyes: Pupils are equal, round, and reactive to light. Right eye exhibits no discharge. Left eye exhibits no discharge. No scleral icterus.  Neck: Normal range of motion. Neck supple. Carotid bruit is not present. No thyromegaly present.  Cardiovascular: Normal rate, regular rhythm and normal heart sounds. Exam reveals no gallop and no friction rub.  No murmur heard. Pulmonary/Chest: Effort normal and breath sounds normal. No respiratory distress. She has no wheezes. She has no rales. No breast tenderness or discharge.  Abdominal: Soft. Bowel sounds are normal. There is no tenderness. There is no rebound.  Genitourinary: No breast tenderness or discharge.  Musculoskeletal: Normal range of motion.  Lymphadenopathy:    She has no cervical adenopathy.  Neurological: She is alert and oriented to person, place, and time.  Skin: Skin is warm, dry and intact. No rash noted.  Psychiatric: She has a normal mood and affect. Her speech is normal and behavior is normal. Judgment and thought content normal. Cognition and memory are normal.    Results for orders placed or performed in visit on 08/23/16  TSH  Result Value Ref Range   TSH 5.080 (H) 0.450 - 4.500 uIU/mL  Comprehensive metabolic panel  Result Value Ref Range   Glucose 92 65 - 99 mg/dL   BUN 10 8 - 27 mg/dL   Creatinine, Ser 0.66 0.57 - 1.00 mg/dL   GFR calc non Af Amer 92 >59 mL/min/1.73   GFR calc Af Amer 106 >59 mL/min/1.73   BUN/Creatinine Ratio 15 12 - 28   Sodium 142 134 - 144 mmol/L   Potassium 4.6 3.5 - 5.2 mmol/L   Chloride 99 96 - 106 mmol/L   CO2 25 18 - 29 mmol/L   Calcium 9.2  8.7 - 10.3 mg/dL   Total Protein 6.8 6.0 -  8.5 g/dL   Albumin 4.3 3.6 - 4.8 g/dL   Globulin, Total 2.5 1.5 - 4.5 g/dL   Albumin/Globulin Ratio 1.7 1.2 - 2.2   Bilirubin Total 0.4 0.0 - 1.2 mg/dL   Alkaline Phosphatase 79 39 - 117 IU/L   AST 19 0 - 40 IU/L   ALT 11 0 - 32 IU/L  Lipid Panel Piccolo, Waived  Result Value Ref Range   Cholesterol Piccolo, Waived 234 (H) <200 mg/dL   HDL Chol Piccolo, Waived 75 >59 mg/dL   Triglycerides Piccolo,Waived 106 <150 mg/dL   Chol/HDL Ratio Piccolo,Waive 3.1 mg/dL   LDL Chol Calc Piccolo Waived 138 (H) <100 mg/dL   VLDL Chol Calc Piccolo,Waive 21 <30 mg/dL      Assessment & Plan:   Problem List Items Addressed This Visit      Unprioritized   Chronic anxiety    Pt has been on the same dose of alprazolam for several years.  Discussed coming back every 3 months for refills      Relevant Medications   ALPRAZolam (XANAX) 0.5 MG tablet   Hypertension    States BP is normal at home.  Last BP to goal.  Keep on monitoring and recheck in 3 months.  RTC is BP is greater than 140/80      Relevant Orders   Comprehensive metabolic panel   Lipid Panel w/o Chol/HDL Ratio   Hypothyroidism    Sub-clinical.  Check TSH today      Relevant Orders   TSH    Other Visit Diagnoses    Annual physical exam    -  Primary   Need for hepatitis C screening test       Relevant Orders   Hepatitis C antibody       Follow up plan: Return in about 3 months (around 07/04/2018).

## 2018-04-03 NOTE — Assessment & Plan Note (Signed)
States BP is normal at home.  Last BP to goal.  Keep on monitoring and recheck in 3 months.  RTC is BP is greater than 140/80

## 2018-04-03 NOTE — Assessment & Plan Note (Signed)
Sub-clinical.  Check TSH today

## 2018-04-03 NOTE — Assessment & Plan Note (Signed)
Pt has been on the same dose of alprazolam for several years.  Discussed coming back every 3 months for refills

## 2018-04-04 ENCOUNTER — Encounter: Payer: Self-pay | Admitting: Unknown Physician Specialty

## 2018-04-04 LAB — COMPREHENSIVE METABOLIC PANEL
A/G RATIO: 1.8 (ref 1.2–2.2)
ALBUMIN: 4.2 g/dL (ref 3.6–4.8)
ALK PHOS: 82 IU/L (ref 39–117)
ALT: 14 IU/L (ref 0–32)
AST: 19 IU/L (ref 0–40)
BILIRUBIN TOTAL: 0.5 mg/dL (ref 0.0–1.2)
BUN / CREAT RATIO: 17 (ref 12–28)
BUN: 12 mg/dL (ref 8–27)
CHLORIDE: 101 mmol/L (ref 96–106)
CO2: 23 mmol/L (ref 20–29)
Calcium: 9.5 mg/dL (ref 8.7–10.3)
Creatinine, Ser: 0.69 mg/dL (ref 0.57–1.00)
GFR calc non Af Amer: 89 mL/min/{1.73_m2} (ref >59)
GFR, EST AFRICAN AMERICAN: 103 mL/min/{1.73_m2} (ref >59)
GLOBULIN, TOTAL: 2.4 g/dL (ref 1.5–4.5)
GLUCOSE: 93 mg/dL (ref 65–99)
POTASSIUM: 4.3 mmol/L (ref 3.5–5.2)
SODIUM: 141 mmol/L (ref 134–144)
TOTAL PROTEIN: 6.6 g/dL (ref 6.0–8.5)

## 2018-04-04 LAB — LIPID PANEL W/O CHOL/HDL RATIO
Cholesterol, Total: 217 mg/dL — ABNORMAL HIGH (ref 100–199)
HDL: 78 mg/dL (ref >39)
LDL Calculated: 119 mg/dL — ABNORMAL HIGH (ref 0–99)
Triglycerides: 99 mg/dL (ref 0–149)
VLDL Cholesterol Cal: 20 mg/dL (ref 5–40)

## 2018-04-04 LAB — TSH: TSH: 5.54 u[IU]/mL — ABNORMAL HIGH (ref 0.450–4.500)

## 2018-04-04 LAB — HEPATITIS C ANTIBODY

## 2018-04-05 ENCOUNTER — Encounter: Payer: Self-pay | Admitting: Unknown Physician Specialty

## 2018-04-05 NOTE — Addendum Note (Signed)
Addended by: Kathrine Haddock on: 04/05/2018 05:13 PM   Modules accepted: Level of Service

## 2018-04-11 ENCOUNTER — Telehealth: Payer: Self-pay | Admitting: Unknown Physician Specialty

## 2018-04-11 NOTE — Telephone Encounter (Signed)
Pt had questions regarding phrase noted on lab work concerning sample itself. Reviewed and verbalizes understanding.

## 2018-04-11 NOTE — Telephone Encounter (Signed)
Copied from [place] [id number]. Topic: Quick Communication - Lab Results >> Apr 11, 2018 10:29 AM Antonieta Iba C wrote: Pt received her most recent lab results and has questions, please call back.    CB: [phone]

## 2018-05-02 DIAGNOSIS — Z1231 Encounter for screening mammogram for malignant neoplasm of breast: Secondary | ICD-10-CM | POA: Diagnosis not present

## 2018-05-02 LAB — HM MAMMOGRAPHY

## 2018-06-29 ENCOUNTER — Ambulatory Visit (INDEPENDENT_AMBULATORY_CARE_PROVIDER_SITE_OTHER): Payer: Medicare HMO | Admitting: Family Medicine

## 2018-06-29 ENCOUNTER — Encounter: Payer: Self-pay | Admitting: Family Medicine

## 2018-06-29 VITALS — BP 118/80 | HR 71 | Temp 98.4°F | Ht <= 58 in | Wt 96.8 lb

## 2018-06-29 DIAGNOSIS — H6981 Other specified disorders of Eustachian tube, right ear: Secondary | ICD-10-CM | POA: Diagnosis not present

## 2018-06-29 MED ORDER — PREDNISONE 50 MG PO TABS: 50.0000 mg | ORAL_TABLET | Freq: Every day | ORAL | 0 refills | Status: DC

## 2018-06-29 NOTE — Progress Notes (Signed)
BP 118/80   Pulse 71   Temp 98.4 F (36.9 C) (Oral)   Ht 4' 9.5" (1.461 m)   Wt 96 lb 12.8 oz (43.9 kg)   SpO2 98%   BMI 20.58 kg/m    Subjective:    Patient ID: Brandy Chen, female    DOB: [birth year], 67 y.o.   MRN: [id number]  HPI: Brandy Chen is a 67 y.o. female  Chief Complaint  Patient presents with  . Ear Problem    pt states she has had popping in her right ear for the last 1 to 2 weeks    EAG CLOGGED Duration: 1-2 weeks Involved ear(s):  "right Sensation of feeling clogged/plugged: occasionally Decreased/muffled hearing:no Ear pain: yes- soreness Fever: no Otorrhea: no Hearing loss: no Upper respiratory infection symptoms: no Using Q-Tips: yes- using some peroxide Status: better History of cerumenosis: no Treatments attempted: none  Relevant past medical, surgical, family and social history reviewed and updated as indicated. Interim medical history since our last visit reviewed. Allergies and medications reviewed and updated.  Review of Systems  Constitutional: Negative.   HENT: Negative for sore throat, tinnitus, trouble swallowing and voice change.   Respiratory: Negative.   Cardiovascular: Negative.   Psychiatric/Behavioral: Negative.     Per HPI unless specifically indicated above     Objective:    BP 118/80   Pulse 71   Temp 98.4 F (36.9 C) (Oral)   Ht 4' 9.5" (1.461 m)   Wt 96 lb 12.8 oz (43.9 kg)   SpO2 98%   BMI 20.58 kg/m   Wt Readings from Last 3 Encounters:  06/29/18 96 lb 12.8 oz (43.9 kg)  04/03/18 98 lb (44.5 kg)  03/20/18 97 lb (44 kg)    Physical Exam  Constitutional: She is oriented to person, place, and time. She appears well-developed and well-nourished. No distress.  HENT:  Head: Normocephalic and atraumatic.  Right Ear: Hearing and external ear normal.  Left Ear: Hearing and external ear normal.  Nose: Nose normal.  Mouth/Throat: Oropharynx is clear and moist. No oropharyngeal exudate.  Eyes: Pupils are  equal, round, and reactive to light. Conjunctivae, EOM and lids are normal. Right eye exhibits no discharge. Left eye exhibits no discharge. No scleral icterus.  Neck: Normal range of motion. Neck supple. No JVD present. No tracheal deviation present. No thyromegaly present.  Cardiovascular: Normal rate, regular rhythm, normal heart sounds and intact distal pulses. Exam reveals no gallop and no friction rub.  No murmur heard. Pulmonary/Chest: Effort normal and breath sounds normal. No stridor. No respiratory distress. She has no wheezes. She has no rales. She exhibits no tenderness.  Musculoskeletal: Normal range of motion.  Neurological: She is alert and oriented to person, place, and time.  Skin: Skin is warm, dry and intact. Capillary refill takes less than 2 seconds. No rash noted. She is not diaphoretic. No erythema. No pallor.  Psychiatric: She has a normal mood and affect. Her speech is normal and behavior is normal. Judgment and thought content normal. Cognition and memory are normal.  Nursing note and vitals reviewed.   Results for orders placed or performed in visit on 05/03/18  HM MAMMOGRAPHY  Result Value Ref Range   HM Mammogram 0-4 Bi-Rad 0-4 Bi-Rad, Self Reported Normal      Assessment & Plan:   Problem List Items Addressed This Visit    None    Visit Diagnoses    Dysfunction of right eustachian tube    -  Primary   Will treat with prednisone burst. Call with any concerns. Continue to monitor.        Follow up plan: Return if symptoms worsen or fail to improve.

## 2018-06-29 NOTE — Patient Instructions (Signed)
Eustachian Tube Dysfunction The eustachian tube connects the middle ear to the back of the nose. It regulates air pressure in the middle ear by allowing air to move between the ear and nose. It also helps to drain fluid from the middle ear space. When the eustachian tube does not function properly, air pressure, fluid, or both can build up in the middle ear. Eustachian tube dysfunction can affect one or both ears. What are the causes? This condition happens when the eustachian tube becomes blocked or cannot open normally. This may result from:  Ear infections.  Colds and other upper respiratory infections.  Allergies.  Irritation, such as from cigarette smoke or acid from the stomach coming up into the esophagus (gastroesophageal reflux).  Sudden changes in air pressure, such as from descending in an airplane.  Abnormal growths in the nose or throat, such as nasal polyps, tumors, or enlarged tissue at the back of the throat (adenoids).  What increases the risk? This condition may be more likely to develop in people who smoke and people who are overweight. Eustachian tube dysfunction may also be more likely to develop in children, especially children who have:  Certain birth defects of the mouth, such as cleft palate.  Large tonsils and adenoids.  What are the signs or symptoms? Symptoms of this condition may include:  A feeling of fullness in the ear.  Ear pain.  Clicking or popping noises in the ear.  Ringing in the ear.  Hearing loss.  Loss of balance.  Symptoms may get worse when the air pressure around you changes, such as when you travel to an area of high elevation or fly on an airplane. How is this diagnosed? This condition may be diagnosed based on:  Your symptoms.  A physical exam of your ear, nose, and throat.  Tests, such as those that measure: ? The movement of your eardrum (tympanogram). ? Your hearing (audiometry).  How is this treated? Treatment  depends on the cause and severity of your condition. If your symptoms are mild, you may be able to relieve your symptoms by moving air into ("popping") your ears. If you have symptoms of fluid in your ears, treatment may include:  Decongestants.  Antihistamines.  Nasal sprays or ear drops that contain medicines that reduce swelling (steroids).  In some cases, you may need to have a procedure to drain the fluid in your eardrum (myringotomy). In this procedure, a small tube is placed in the eardrum to:  Drain the fluid.  Restore the air in the middle ear space.  Follow these instructions at home:  Take over-the-counter and prescription medicines only as told by your health care provider.  Use techniques to help pop your ears as recommended by your health care provider. These may include: ? Chewing gum. ? Yawning. ? Frequent, forceful swallowing. ? Closing your mouth, holding your nose closed, and gently blowing as if you are trying to blow air out of your nose.  Do not do any of the following until your health care provider approves: ? Travel to high altitudes. ? Fly in airplanes. ? Work in a pressurized cabin or room. ? Scuba dive.  Keep your ears dry. Dry your ears completely after showering or bathing.  Do not smoke.  Keep all follow-up visits as told by your health care provider. This is important. Contact a health care provider if:  Your symptoms do not go away after treatment.  Your symptoms come back after treatment.  You are   unable to pop your ears.  You have: ? A fever. ? Pain in your ear. ? Pain in your head or neck. ? Fluid draining from your ear.  Your hearing suddenly changes.  You become very dizzy.  You lose your balance. This information is not intended to replace advice given to you by your health care provider. Make sure you discuss any questions you have with your health care provider. Document Released: 10/10/2015 Document Revised: 02/19/2016  Document Reviewed: 10/02/2014 Elsevier Interactive Patient Education  2018 [employer]  

## 2018-07-17 ENCOUNTER — Encounter: Payer: Self-pay | Admitting: Nurse Practitioner

## 2018-07-17 ENCOUNTER — Other Ambulatory Visit: Payer: Self-pay

## 2018-07-17 ENCOUNTER — Ambulatory Visit (INDEPENDENT_AMBULATORY_CARE_PROVIDER_SITE_OTHER): Payer: Medicare HMO | Admitting: Nurse Practitioner

## 2018-07-17 VITALS — BP 138/74 | HR 68 | Temp 97.9°F | Ht <= 58 in | Wt 98.5 lb

## 2018-07-17 DIAGNOSIS — F32A Depression, unspecified: Secondary | ICD-10-CM

## 2018-07-17 DIAGNOSIS — F329 Major depressive disorder, single episode, unspecified: Secondary | ICD-10-CM | POA: Diagnosis not present

## 2018-07-17 DIAGNOSIS — Z23 Encounter for immunization: Secondary | ICD-10-CM

## 2018-07-17 DIAGNOSIS — H6981 Other specified disorders of Eustachian tube, right ear: Secondary | ICD-10-CM

## 2018-07-17 DIAGNOSIS — F419 Anxiety disorder, unspecified: Secondary | ICD-10-CM

## 2018-07-17 MED ORDER — SERTRALINE HCL 25 MG PO TABS: 25.0000 mg | ORAL_TABLET | Freq: Every day | ORAL | 4 refills | Status: DC

## 2018-07-17 MED ORDER — ALPRAZOLAM 0.5 MG PO TABS
0.5000 mg | ORAL_TABLET | Freq: Every day | ORAL | 2 refills | Status: DC
Start: 2018-07-17 — End: 2018-10-23

## 2018-07-17 NOTE — Assessment & Plan Note (Signed)
Acute and improved on exam today.  Continue to monitor.

## 2018-07-17 NOTE — Progress Notes (Signed)
BP 138/74   Pulse 68   Temp 97.9 F (36.6 C) (Oral)   Ht 4' 9.5" (1.461 m)   Wt 98 lb 8 oz (44.7 kg)   SpO2 98%   BMI 20.95 kg/m    Subjective:    Patient ID: Brandy Chen, female    DOB: 03-25-1951, 67 y.o.   MRN: [id number]  HPI: Brandy Chen is a 67 y.o. female presents for f/u anxiety and ETD  Chief Complaint  Patient presents with  . Medication Refill    Xanax   ANXIETY/STRESS: On daily Xanax with good control, although reports she would like something to take during daytime hours, as does feel "some" anxiety during the day.  Discussed use of Zoloft as a maintenance medication for anxiety and educated on research and use of medication + risks/benefits.  Patient would like to trial this as maintenance medication for anxiety. Duration:controlled Anxious mood: yes , she reports she cares for two sick dogs and that gives her a lot of anxiety Excessive worrying: yes Irritability: no  Sweating: no Nausea: no Palpitations:no Hyperventilation: no Panic attacks: no Agoraphobia: no  Obscessions/compulsions: no Depressed mood: no Depression screen [facility] 2/9 07/17/2018 04/03/2018 03/20/2018 09/12/2017 02/17/2017  Decreased Interest 0 0 0 0 0  Down, Depressed, Hopeless 0 0 0 1 0  PHQ - 2 Score 0 0 0 1 0  Altered sleeping 1 0 - 0 0  Tired, decreased energy 1 0 - 0 0  Change in appetite 1 0 - 0 0  Feeling bad or failure about yourself  0 0 - 0 0  Trouble concentrating 0 0 - 0 0  Moving slowly or fidgety/restless 0 0 - 0 0  Suicidal thoughts 0 0 - 0 0  PHQ-9 Score 3 0 - 1 0   GAD 7 : Generalized Anxiety Score 07/17/2018  Nervous, Anxious, on Edge 1  Control/stop worrying 0  Worry too much - different things 1  Trouble relaxing 1  Restless 0  Easily annoyed or irritable 1  Afraid - awful might happen 0  Total GAD 7 Score 4   Anhedonia: no Weight changes: no Insomnia: yes hard to stay asleep , reports Xanax helps her fall asleep, but does not sleep well throughout  night Hypersomnia: no Fatigue/loss of energy: no Feelings of worthlessness: no Feelings of guilt: no Impaired concentration/indecisiveness: no Suicidal ideations: no  Crying spells: no Recent Stressors/Life Changes: no   Relationship problems: no   Family stress: no     Financial stress: no    Job stress: no    Recent death/loss: no  EUSTACHIAN TUBE DYSFUNCTION: She reports it has "turned a corner" and reports it is feeling "a lot better".   States she still has some "right jaw" discomfort, but it is better.  She reports taking "Aleeve at home sometimes" and noting improvement.   Relevant past medical, surgical, family and social history reviewed and updated as indicated. Interim medical history since our last visit reviewed. Allergies and medications reviewed and updated.  Review of Systems  Constitutional: Negative for activity change, appetite change and fatigue.  HENT: Negative for ear discharge, ear pain, sinus pressure and sinus pain.   Respiratory: Negative for cough, chest tightness and shortness of breath.   Cardiovascular: Negative for chest pain, palpitations and leg swelling.  Gastrointestinal: Negative for abdominal distention and abdominal pain.  Neurological: Negative for dizziness, numbness and headaches.  Psychiatric/Behavioral: Negative for behavioral problems, confusion and decreased concentration. The patient  is nervous/anxious.      Per HPI unless specifically indicated above     Objective:    BP 138/74   Pulse 68   Temp 97.9 F (36.6 C) (Oral)   Ht 4' 9.5" (1.461 m)   Wt 98 lb 8 oz (44.7 kg)   SpO2 98%   BMI 20.95 kg/m   Wt Readings from Last 3 Encounters:  07/17/18 98 lb 8 oz (44.7 kg)  06/29/18 96 lb 12.8 oz (43.9 kg)  04/03/18 98 lb (44.5 kg)    Physical Exam  Constitutional: She is oriented to person, place, and time. She appears well-developed and well-nourished.  HENT:  Head: Normocephalic and atraumatic.  Right Ear: Hearing, tympanic  membrane, external ear and ear canal normal.  Left Ear: Hearing, tympanic membrane, external ear and ear canal normal.  Nose: Nose normal.  Mouth/Throat: Oropharynx is clear and moist.  Eyes: Pupils are equal, round, and reactive to light. EOM are normal.  Neck: Normal range of motion. Neck supple.  Cardiovascular: Normal rate, regular rhythm and normal heart sounds.  Pulmonary/Chest: Effort normal and breath sounds normal.  Abdominal: Soft. Bowel sounds are normal.  Neurological: She is alert and oriented to person, place, and time.  Skin: Skin is warm and dry.  Psychiatric: She has a normal mood and affect. Her behavior is normal. Judgment and thought content normal.    Results for orders placed or performed in visit on 05/03/18  HM MAMMOGRAPHY  Result Value Ref Range   HM Mammogram 0-4 Bi-Rad 0-4 Bi-Rad, Self Reported Normal      Assessment & Plan:   Problem List Items Addressed This Visit      Nervous and Auditory   Eustachian tube dysfunction, right    Acute and improved on exam today.  Continue to monitor.        Other   Anxiety and depression    Chronic, ongoing.  GAD score 4 today and PHQ9 3.  Continue Xanax at HS.  Add Sertraline 25MG  PO QDAY for maintenance medication, offers benefit for anxiety and depression.  Educated patient on risks/benefits and discussed need to utilize 4-6 weeks to note full benefit.  Encourage GDR Xanax in upcoming months if benefit from Sertraline noted.      Relevant Medications   sertraline (ZOLOFT) 25 MG tablet   ALPRAZolam (XANAX) 0.5 MG tablet    Other Visit Diagnoses    Flu vaccine need    -  Primary   Relevant Orders   Flu vaccine HIGH DOSE PF (Completed)       Follow up plan: Return in about 3 months (around 10/17/2018) for Anxiety (med refill).

## 2018-07-17 NOTE — Patient Instructions (Signed)

## 2018-07-17 NOTE — Assessment & Plan Note (Addendum)
Chronic, ongoing.  GAD score 4 today and PHQ9 3.  Continue Xanax at HS.  Add Sertraline 25MG  PO QDAY for maintenance medication, offers benefit for anxiety and depression.  Educated patient on risks/benefits and discussed need to utilize 4-6 weeks to note full benefit.  Encourage GDR Xanax in upcoming months if benefit from Sertraline noted.

## 2018-09-05 DIAGNOSIS — H2513 Age-related nuclear cataract, bilateral: Secondary | ICD-10-CM | POA: Diagnosis not present

## 2018-09-07 ENCOUNTER — Other Ambulatory Visit: Payer: Self-pay | Admitting: Nurse Practitioner

## 2018-10-23 ENCOUNTER — Other Ambulatory Visit: Payer: Self-pay

## 2018-10-23 ENCOUNTER — Encounter: Payer: Self-pay | Admitting: Nurse Practitioner

## 2018-10-23 ENCOUNTER — Ambulatory Visit (INDEPENDENT_AMBULATORY_CARE_PROVIDER_SITE_OTHER): Payer: Medicare HMO | Admitting: Nurse Practitioner

## 2018-10-23 VITALS — BP 124/68 | HR 66 | Temp 97.6°F | Ht <= 58 in | Wt 99.0 lb

## 2018-10-23 DIAGNOSIS — E039 Hypothyroidism, unspecified: Secondary | ICD-10-CM

## 2018-10-23 DIAGNOSIS — E7800 Pure hypercholesterolemia, unspecified: Secondary | ICD-10-CM

## 2018-10-23 DIAGNOSIS — I10 Essential (primary) hypertension: Secondary | ICD-10-CM | POA: Diagnosis not present

## 2018-10-23 DIAGNOSIS — R825 Elevated urine levels of drugs, medicaments and biological substances: Secondary | ICD-10-CM | POA: Diagnosis not present

## 2018-10-23 DIAGNOSIS — F329 Major depressive disorder, single episode, unspecified: Secondary | ICD-10-CM | POA: Diagnosis not present

## 2018-10-23 DIAGNOSIS — F419 Anxiety disorder, unspecified: Secondary | ICD-10-CM | POA: Diagnosis not present

## 2018-10-23 DIAGNOSIS — E559 Vitamin D deficiency, unspecified: Secondary | ICD-10-CM

## 2018-10-23 MED ORDER — ALPRAZOLAM 0.5 MG PO TABS: 0.5000 mg | ORAL_TABLET | Freq: Every day | ORAL | 2 refills | Status: DC

## 2018-10-23 NOTE — Assessment & Plan Note (Signed)
Subclinical at baseline.  Thyroid panel today.

## 2018-10-23 NOTE — Assessment & Plan Note (Signed)
Chronic, continue supplement.  Vitamin D level today.

## 2018-10-23 NOTE — Assessment & Plan Note (Signed)
Chronic, ongoing.  At home BP below goal, initial BP today elevated but repeat improved.  Continue to monitor and recheck at each visit.  Consider Lisinopril if elevation in BP >140/90.

## 2018-10-23 NOTE — Progress Notes (Signed)
BP 124/68 (BP Location: Left Arm, Patient Position: Sitting, Cuff Size: Normal)   Pulse 66   Temp 97.6 F (36.4 C) (Oral)   Ht 4\' 10"  (1.473 m)   Wt 99 lb (44.9 kg)   SpO2 96%   BMI 20.69 kg/m    Subjective:    Patient ID: Brandy Chen, female    DOB: 07-31-1951, 68 y.o.   MRN: [id number]  HPI: Brandy Chen is a 68 y.o. female presents for follow-up  Chief Complaint  Patient presents with  . Anxiety    30m f/u pt would like to discuss about zoloft  . Medication Refill    alprazolam   HYPERTENSION / HYPERLIPIDEMIA No current BP medications or statin, has refused statin on review of past notes. Satisfied with current treatment? yes Duration of hypertension: years BP monitoring frequency: daily BP range: 110-120/70's and at times higher if rushing and doing things around the home Duration of hyperlipidemia: years Aspirin: yes Recent stressors: no Recurrent headaches: no Visual changes: no Palpitations: no Dyspnea: no Chest pain: no Lower extremity edema: no Dizzy/lightheaded: no   HYPOTHYROIDISM Has been in subclinical range with no current medications.  Last TSH 5.540. Thyroid control status:stable Satisfied with current treatment? yes Fatigue: no Cold intolerance: no Heat intolerance: no Weight gain: no Weight loss: no Constipation: no Diarrhea/loose stools: no Palpitations: no Lower extremity edema: no Anxiety/depressed mood: no   ANXIETY/STRESS Has been on Xanax at bedtime for long period.  She is aware to return every 3 months for refills on this medication and the need for annual drug screens.  She tried Sertraline, but reports this was not successful.  Wishes not to trial other medication at this time, would prefer to try herbal remedies. Pt is aware of risks of benzo  medication use to include increased sedation, respiratory suppression, falls,  dependence and cardiovascular events.  Pt would like to continue treatment as benefit determined to outweigh  risk.   Duration:controlled Anxious mood: no  Excessive worrying: no Irritability: no  Sweating: no Nausea: no Palpitations:no Hyperventilation: no Panic attacks: no Agoraphobia: no  Obscessions/compulsions: no Depressed mood: no Depression screen [facility] 2/9 10/23/2018 07/17/2018 04/03/2018 03/20/2018 09/12/2017  Decreased Interest 1 0 0 0 0  Down, Depressed, Hopeless 0 0 0 0 1  PHQ - 2 Score 1 0 0 0 1  Altered sleeping 1 1 0 - 0  Tired, decreased energy 1 1 0 - 0  Change in appetite 0 1 0 - 0  Feeling bad or failure about yourself  0 0 0 - 0  Trouble concentrating 1 0 0 - 0  Moving slowly or fidgety/restless 0 0 0 - 0  Suicidal thoughts 0 0 0 - 0  PHQ-9 Score 4 3 0 - 1  Difficult doing work/chores Not difficult at all - - - -   Anhedonia: no Weight changes: no Insomnia: yes hard to fall asleep  Hypersomnia: no Fatigue/loss of energy: no Feelings of worthlessness: no Feelings of guilt: no Impaired concentration/indecisiveness: no Suicidal ideations: no  Crying spells: no Recent Stressors/Life Changes: no   Relationship problems: no   Family stress: no     Financial stress: no    Job stress: no    Recent death/loss: no  GAD 7 : Generalized Anxiety Score 10/23/2018 07/17/2018  Nervous, Anxious, on Edge 1 1  Control/stop worrying 0 0  Worry too much - different things 1 1  Trouble relaxing 1 1  Restless 0 0  Easily annoyed or irritable 1 1  Afraid - awful might happen 0 0  Total GAD 7 Score 4 4  Anxiety Difficulty Not difficult at all -    TMJ: Has been ongoing since September after she went to dentist and had xrays, then ate a "tough" brownie.  Uses Aleeve at home, which she reports does help.  Discussed doing exercises at home, which she is going to utilize.  Does not want to go to ENT at this time, but may consider in upcoming months.  Relevant past medical, surgical, family and social history reviewed and updated as indicated. Interim medical history since our last  visit reviewed. Allergies and medications reviewed and updated.  Review of Systems  Constitutional: Negative for activity change, appetite change, diaphoresis, fatigue and fever.  Respiratory: Negative for cough, chest tightness and shortness of breath.   Cardiovascular: Negative for chest pain, palpitations and leg swelling.  Gastrointestinal: Negative for abdominal distention, abdominal pain, constipation, diarrhea, nausea and vomiting.  Endocrine: Negative for cold intolerance, heat intolerance, polydipsia, polyphagia and polyuria.  Neurological: Negative for dizziness, syncope, weakness, light-headedness, numbness and headaches.  Psychiatric/Behavioral: Negative.     Per HPI unless specifically indicated above     Objective:    BP 124/68 (BP Location: Left Arm, Patient Position: Sitting, Cuff Size: Normal)   Pulse 66   Temp 97.6 F (36.4 C) (Oral)   Ht 4\' 10"  (1.473 m)   Wt 99 lb (44.9 kg)   SpO2 96%   BMI 20.69 kg/m   Wt Readings from Last 3 Encounters:  10/23/18 99 lb (44.9 kg)  07/17/18 98 lb 8 oz (44.7 kg)  06/29/18 96 lb 12.8 oz (43.9 kg)    Physical Exam Vitals signs and nursing note reviewed.  Constitutional:      General: She is awake.     Appearance: She is well-developed.  HENT:     Head: Normocephalic.     Right Ear: Hearing normal.     Left Ear: Hearing normal.     Nose: Nose normal.     Mouth/Throat:     Mouth: Mucous membranes are moist.  Eyes:     General: Lids are normal.        Right eye: No discharge.        Left eye: No discharge.     Conjunctiva/sclera: Conjunctivae normal.     Pupils: Pupils are equal, round, and reactive to light.  Neck:     Musculoskeletal: Normal range of motion and neck supple.     Thyroid: No thyromegaly.     Vascular: No carotid bruit or JVD.  Cardiovascular:     Rate and Rhythm: Normal rate and regular rhythm.     Heart sounds: Normal heart sounds. No murmur. No gallop.   Pulmonary:     Effort: Pulmonary effort  is normal.     Breath sounds: Normal breath sounds.  Abdominal:     General: Bowel sounds are normal.     Palpations: Abdomen is soft. There is no hepatomegaly or splenomegaly.  Musculoskeletal:     Right lower leg: No edema.     Left lower leg: No edema.  Lymphadenopathy:     Cervical: No cervical adenopathy.  Skin:    General: Skin is warm and dry.  Neurological:     Mental Status: She is alert and oriented to person, place, and time.  Psychiatric:        Attention and Perception: Attention normal.  Mood and Affect: Mood normal.        Behavior: Behavior normal. Behavior is cooperative.        Thought Content: Thought content normal.        Judgment: Judgment normal.     Results for orders placed or performed in visit on 05/03/18  HM MAMMOGRAPHY  Result Value Ref Range   HM Mammogram 0-4 Bi-Rad 0-4 Bi-Rad, Self Reported Normal      Assessment & Plan:   Problem List Items Addressed This Visit      Cardiovascular and Mediastinum   Hypertension    Chronic, ongoing.  At home BP below goal, initial BP today elevated but repeat improved.  Continue to monitor and recheck at each visit.  Consider Lisinopril if elevation in BP >140/90.      Relevant Orders   Basic Metabolic Panel (BMET)     Endocrine   Hypothyroidism    Subclinical at baseline.  Thyroid panel today.      Relevant Orders   Thyroid Panel With TSH     Other   Hypercholesteremia    Chronic, refuses statin use.  Lipid panel today to monitor.  Continue ongoing discussions.      Relevant Orders   Lipid Panel w/o Chol/HDL Ratio   Vitamin D deficiency    Chronic, continue supplement.  Vitamin D level today.      Relevant Orders   VITAMIN D 25 Hydroxy (Vit-D Deficiency, Fractures)   Chronic anxiety - Primary    Chronic, has been on Xanax for several years.  Refills sent today.  To continue Q3MOS visits.  UDS today.  Sertraline unsuccessful.        Relevant Medications   ALPRAZolam (XANAX) 0.5  MG tablet   Other Relevant Orders   Urine drugs of abuse scrn w alc, routine (Ref Lab)      [place] Narcotic database reviewed for this patient, and feel that the risk/benefit ratio today is favorable for proceeding with a prescription for controlled substance.  Last refill Xanax 10/11/2018 with 30 tablets.  Refills sent.  Follow up plan: Return in about 3 months (around 01/22/2019) for Anxiety and  HTN.

## 2018-10-23 NOTE — Patient Instructions (Signed)

## 2018-10-23 NOTE — Assessment & Plan Note (Signed)
Chronic, refuses statin use.  Lipid panel today to monitor.  Continue ongoing discussions.

## 2018-10-23 NOTE — Assessment & Plan Note (Signed)
Chronic, has been on Xanax for several years.  Refills sent today.  To continue Q3MOS visits.  UDS today.  Sertraline unsuccessful.

## 2018-10-24 ENCOUNTER — Telehealth: Payer: Self-pay | Admitting: Nurse Practitioner

## 2018-10-24 LAB — URINE DRUGS OF ABUSE SCREEN W ALC, ROUTINE (REF LAB)
Amphetamines, Urine: NEGATIVE ng/mL
Barbiturate Quant, Ur: NEGATIVE ng/mL
Benzodiazepine Quant, Ur: NEGATIVE ng/mL
CANNABINOID QUANT UR: NEGATIVE ng/mL
Cocaine (Metab.): NEGATIVE ng/mL
Ethanol, Urine: NEGATIVE %
Methadone Screen, Urine: NEGATIVE ng/mL
Opiate Quant, Ur: NEGATIVE ng/mL
PCP Quant, Ur: NEGATIVE ng/mL
Propoxyphene: NEGATIVE ng/mL

## 2018-10-24 LAB — BASIC METABOLIC PANEL
BUN/Creatinine Ratio: 18 (ref 12–28)
BUN: 13 mg/dL (ref 8–27)
CALCIUM: 9.6 mg/dL (ref 8.7–10.3)
CO2: 24 mmol/L (ref 20–29)
Chloride: 101 mmol/L (ref 96–106)
Creatinine, Ser: 0.71 mg/dL (ref 0.57–1.00)
GFR calc non Af Amer: 87 mL/min/{1.73_m2} (ref >59)
GFR, EST AFRICAN AMERICAN: 100 mL/min/{1.73_m2} (ref >59)
Glucose: 91 mg/dL (ref 65–99)
Potassium: 4.7 mmol/L (ref 3.5–5.2)
Sodium: 143 mmol/L (ref 134–144)

## 2018-10-24 LAB — LIPID PANEL W/O CHOL/HDL RATIO
Cholesterol, Total: 235 mg/dL — ABNORMAL HIGH (ref 100–199)
HDL: 74 mg/dL (ref >39)
LDL Calculated: 136 mg/dL — ABNORMAL HIGH (ref 0–99)
Triglycerides: 123 mg/dL (ref 0–149)
VLDL Cholesterol Cal: 25 mg/dL (ref 5–40)

## 2018-10-24 LAB — THYROID PANEL WITH TSH
FREE THYROXINE INDEX: 1.9 (ref 1.2–4.9)
T3 UPTAKE RATIO: 25 % (ref 24–39)
T4, Total: 7.4 ug/dL (ref 4.5–12.0)
TSH: 6.71 u[IU]/mL — ABNORMAL HIGH (ref 0.450–4.500)

## 2018-10-24 LAB — VITAMIN D 25 HYDROXY (VIT D DEFICIENCY, FRACTURES): Vit D, 25-Hydroxy: 49.3 ng/mL (ref 30.0–100.0)

## 2018-10-24 NOTE — Telephone Encounter (Signed)
Copied from [place] [id number]. Topic: General - Other >> Oct 24, 2018  2:47 PM Antonieta Iba C wrote: Reason for CRM: pt called in returning Jolene's call for lab results.   Please call back.

## 2018-10-24 NOTE — Telephone Encounter (Signed)
Called patient back and reviewed labs and recommendations.

## 2018-12-14 ENCOUNTER — Ambulatory Visit: Payer: Self-pay | Admitting: *Deleted

## 2018-12-14 NOTE — Telephone Encounter (Signed)
Pt reports unwitnessed fall Sunday morning, 0630. States felt "A little" nauseated, lightheaded "Then went down." Unsure of LOC, states "Just felt weak and went down, I remember falling and I got right up." Reports bruising of eye, forehead and cheekbone. States applied ice after occurred. Reports H/O "Inner ear." States has had lightheadedness for "Years." Took meclizine (2016). States lightheadedness still occurs occassionally, not daily. BP at time of fall 120/80, HR 95. Checked during triage call, 134/93  HR 77. No new medications, takes 0.5mg Xanax every night. Pt initially calling to request refill of Meclizine. Appt made with J. Cannady for tomorrow. Care advise given per protocol. Pt verbalizes understanding.  Reason for Disposition . [1] MODERATE dizziness (e.g., interferes with normal activities) AND [2] has NOT been evaluated by physician for this  (Exception: dizziness caused by heat exposure, sudden standing, or poor fluid intake)  Answer Assessment - Initial Assessment Questions 1. DESCRIPTION: "Describe your dizziness."     Spinning 2. LIGHTHEADED: "Do you feel lightheaded?" (e.g., somewhat faint, woozy, weak upon standing)     Yes, "A little" 3. VERTIGO: "Do you feel like either you or the room is spinning or tilting?" (i.e. vertigo)     At times 4. SEVERITY: "How bad is it?"  "Do you feel like you are going to faint?" "Can you stand and walk?"   - MILD - walking normally   - MODERATE - interferes with normal activities (e.g., work, school)    - SEVERE - unable to stand, requires support to walk, feels like passing out now.      moderate 5. ONSET:  "When did the dizziness begin?"     Off and on for years, not daily 6. AGGRAVATING FACTORS: "Does anything make it worse?" (e.g., standing, change in head position)     Turn head, sitting to standing 7. HEART RATE: "Can you tell me your heart rate?" "How many beats in 15 seconds?"  (Note: not all patients can do this)       77  8.  CAUSE: "What do you think is causing the dizziness?"     "Inner ears for years." 9. RECURRENT SYMPTOM: "Have you had dizziness before?" If so, ask: "When was the last time?" "What happened that time?"     Years ago 10. OTHER SYMPTOMS: "Do you have any other symptoms?" (e.g., fever, chest pain, vomiting, diarrhea, bleeding)      Stuffy nose  Protocols used: DIZZINESS Heidi Dach

## 2018-12-15 ENCOUNTER — Ambulatory Visit (INDEPENDENT_AMBULATORY_CARE_PROVIDER_SITE_OTHER): Payer: Medicare HMO | Admitting: Nurse Practitioner

## 2018-12-15 ENCOUNTER — Ambulatory Visit
Admission: RE | Admit: 2018-12-15 | Discharge: 2018-12-15 | Disposition: A | Discharge status: Home / Self Care | Payer: Medicare HMO | Source: Ambulatory Visit | Attending: Nurse Practitioner | Admitting: Nurse Practitioner

## 2018-12-15 ENCOUNTER — Encounter: Payer: Self-pay | Admitting: Nurse Practitioner

## 2018-12-15 ENCOUNTER — Other Ambulatory Visit: Payer: Self-pay

## 2018-12-15 ENCOUNTER — Ambulatory Visit: Admission: RE | Admit: 2018-12-15 | Payer: Self-pay | Source: Ambulatory Visit

## 2018-12-15 VITALS — BP 138/76 | HR 73 | Temp 98.0°F | Ht <= 58 in | Wt 99.0 lb

## 2018-12-15 DIAGNOSIS — R42 Dizziness and giddiness: Secondary | ICD-10-CM

## 2018-12-15 MED ORDER — PREDNISONE 10 MG PO TABS: 30.0000 mg | ORAL_TABLET | Freq: Every day | ORAL | 0 refills | Status: AC

## 2018-12-15 MED ORDER — MECLIZINE HCL 25 MG PO TABS: 25.0000 mg | ORAL_TABLET | Freq: Three times a day (TID) | ORAL | 0 refills | Status: DC | PRN

## 2018-12-15 NOTE — Progress Notes (Signed)
BP 138/76   Pulse 73   Temp 98 F (36.7 C) (Oral)   Ht 4\' 10"  (1.473 m)   Wt 99 lb (44.9 kg)   SpO2 98%   BMI 20.69 kg/m    Subjective:    Patient ID: Brandy Chen, female    DOB: 1951/09/26, 68 y.o.   MRN: [id number]  HPI: Brandy Chen is a 68 y.o. female  Chief Complaint  Patient presents with  . Fall    last Sunday   FALL: She reports she was sick over weekend, with sore throat on Friday.  No fever.  Saturday felt "so bad I could hardly get around".  Then on Sunday continued to feel bad, took her dog for a walk then when came back inside she went to get his dog the insulin he takes.  Walked to get insulin, beside counter and stove, started to "break out in a sweat".  She leaned towards stove and that is "when I went out".  States she hit tip of right side of head on the stove, has "black eye since".  Did not go to hospital because "people are sick and they are telling you not to go, unless you need to".  She is unsure if she loss consciousness, just remembers sound of falling.  "If I did it was for a split second".  Since this episode she does endorse having sound dizziness with movement, at baseline she has dizziness and takes Meclizine for this.  She denies injury to any other location on her right side and denies pain.  Denies change in vision, N&V, SOB, CP, headache.  Believes the fall was related to her recent URI, as she reports she "always" gets inner ear infections with these.    Relevant past medical, surgical, family and social history reviewed and updated as indicated. Interim medical history since our last visit reviewed. Allergies and medications reviewed and updated.  Review of Systems  Constitutional: Negative for activity change, appetite change, diaphoresis, fatigue and fever.  Respiratory: Negative for cough, chest tightness and shortness of breath.   Cardiovascular: Negative for chest pain, palpitations and leg swelling.  Gastrointestinal: Negative for  abdominal distention, abdominal pain, constipation, diarrhea, nausea and vomiting.  Endocrine: Negative for cold intolerance, heat intolerance, polydipsia, polyphagia and polyuria.  Neurological: Positive for dizziness (at baseline (gets this intermittently)). Negative for syncope, weakness, light-headedness, numbness and headaches.  Psychiatric/Behavioral: Negative.     Per HPI unless specifically indicated above     Objective:    BP 138/76   Pulse 73   Temp 98 F (36.7 C) (Oral)   Ht 4\' 10"  (1.473 m)   Wt 99 lb (44.9 kg)   SpO2 98%   BMI 20.69 kg/m   Wt Readings from Last 3 Encounters:  12/15/18 99 lb (44.9 kg)  10/23/18 99 lb (44.9 kg)  07/17/18 98 lb 8 oz (44.7 kg)    Physical Exam Vitals signs and nursing note reviewed.  Constitutional:      General: She is awake.     Appearance: She is well-developed.  HENT:     Head: Normocephalic.     Right Ear: Hearing, ear canal and external ear normal. No drainage. A middle ear effusion is present.     Left Ear: Hearing, ear canal and external ear normal. No drainage. A middle ear effusion is present.     Nose: Rhinorrhea present. No mucosal edema. Rhinorrhea is clear.     Right Sinus: No maxillary  sinus tenderness or frontal sinus tenderness.     Left Sinus: No maxillary sinus tenderness or frontal sinus tenderness.     Mouth/Throat:     Mouth: Mucous membranes are moist.     Pharynx: No pharyngeal swelling, oropharyngeal exudate or posterior oropharyngeal erythema.  Eyes:     General: Lids are normal.        Right eye: No discharge.        Left eye: No discharge.     Conjunctiva/sclera: Conjunctivae normal.     Pupils: Pupils are equal, round, and reactive to light.     Visual Fields: Right eye visual fields normal and left eye visual fields normal.     Comments: Bruising, pale yellow to light purple, around exterior of right eye and upper forehead.  Mild tenderness to area of bruising right forehead.  Visual field intact  and no deficit noted.    Neck:     Musculoskeletal: Normal range of motion and neck supple.     Thyroid: No thyromegaly.     Vascular: No carotid bruit or JVD.  Cardiovascular:     Rate and Rhythm: Normal rate and regular rhythm.     Heart sounds: Normal heart sounds. No murmur. No gallop.   Pulmonary:     Effort: Pulmonary effort is normal.     Breath sounds: Normal breath sounds.  Abdominal:     General: Bowel sounds are normal.     Palpations: Abdomen is soft.  Musculoskeletal:     Right lower leg: No edema.     Left lower leg: No edema.     Comments: Full ROM BUE and BLE.  Lymphadenopathy:     Cervical: No cervical adenopathy.  Skin:    General: Skin is warm and dry.     Comments: No bruising to BUE and BLE.    Neurological:     Mental Status: She is alert and oriented to person, place, and time.     Cranial Nerves: Cranial nerves are intact.     Motor: Motor function is intact.     Coordination: Coordination is intact.     Gait: Gait is intact.     Deep Tendon Reflexes:     Reflex Scores:      Brachioradialis reflexes are 2+ on the right side and 2+ on the left side.      Patellar reflexes are 2+ on the right side and 2+ on the left side. Psychiatric:        Attention and Perception: Attention normal.        Mood and Affect: Mood normal.        Behavior: Behavior normal. Behavior is cooperative.        Thought Content: Thought content normal.        Judgment: Judgment normal.     Results for orders placed or performed in visit on 24/58/09  Basic Metabolic Panel (BMET)  Result Value Ref Range   Glucose 91 65 - 99 mg/dL   BUN 13 8 - 27 mg/dL   Creatinine, Ser 0.71 0.57 - 1.00 mg/dL   GFR calc non Af Amer 87 >59 mL/min/1.73   GFR calc Af Amer 100 >59 mL/min/1.73   BUN/Creatinine Ratio 18 12 - 28   Sodium 143 134 - 144 mmol/L   Potassium 4.7 3.5 - 5.2 mmol/L   Chloride 101 96 - 106 mmol/L   CO2 24 20 - 29 mmol/L   Calcium 9.6 8.7 - 10.3 mg/dL  Thyroid  Panel  With TSH  Result Value Ref Range   TSH 6.710 (H) 0.450 - 4.500 uIU/mL   T4, Total 7.4 4.5 - 12.0 ug/dL   T3 Uptake Ratio 25 24 - 39 %   Free Thyroxine Index 1.9 1.2 - 4.9  VITAMIN D 25 Hydroxy (Vit-D Deficiency, Fractures)  Result Value Ref Range   Vit D, 25-Hydroxy 49.3 30.0 - 100.0 ng/mL  Lipid Panel w/o Chol/HDL Ratio  Result Value Ref Range   Cholesterol, Total 235 (H) 100 - 199 mg/dL   Triglycerides 123 0 - 149 mg/dL   HDL 74 >39 mg/dL   VLDL Cholesterol Cal 25 5 - 40 mg/dL   LDL Calculated 136 (H) 0 - 99 mg/dL  Urine drugs of abuse scrn w alc, routine (Ref Lab)  Result Value Ref Range   Amphetamines, Urine Negative Cutoff=1000 ng/mL   Barbiturate Quant, Ur Negative Cutoff=300 ng/mL   Benzodiazepine Quant, Ur Negative Cutoff=300 ng/mL   Cannabinoid Quant, Ur Negative Cutoff=50 ng/mL   Cocaine (Metab.) Negative Cutoff=300 ng/mL   Opiate Quant, Ur Negative Cutoff=300 ng/mL   PCP Quant, Ur Negative Cutoff=25 ng/mL   Methadone Screen, Urine Negative Cutoff=300 ng/mL   Propoxyphene Negative Cutoff=300 ng/mL   Ethanol, Urine Negative Cutoff=0.020 %      Assessment & Plan:   Problem List Items Addressed This Visit      Other   Dizziness - Primary    Post fall, possibly related to recent URI.  Has dizziness on/off at baseline takes Meclizine for.  Refill Meclizine and Prednisone for eustachian tube dysfunction d/t recent URI.  Patient would like CT scan outpatient.  Ordered to assess post fall with dizziness.  Return for worsening or continued issues.      Relevant Orders   CT Head Wo Contrast       Follow up plan: Return if symptoms worsen or fail to improve.

## 2018-12-15 NOTE — Assessment & Plan Note (Addendum)
Post fall, possibly related to recent URI.  Has dizziness on/off at baseline takes Meclizine for.  Refill Meclizine and Prednisone for eustachian tube dysfunction d/t recent URI.  Patient would like CT scan outpatient.  Ordered to assess post fall with dizziness.  Return for worsening or continued issues.

## 2018-12-15 NOTE — Patient Instructions (Signed)

## 2019-03-09 ENCOUNTER — Other Ambulatory Visit: Payer: Self-pay | Admitting: Nurse Practitioner

## 2019-03-09 NOTE — Telephone Encounter (Signed)
Will provide 30 day supply.  She is due for 3 month follow-up though and will need virtual visit for further refills.  Thank you.

## 2019-03-26 ENCOUNTER — Encounter: Payer: Self-pay | Admitting: Nurse Practitioner

## 2019-03-26 ENCOUNTER — Ambulatory Visit (INDEPENDENT_AMBULATORY_CARE_PROVIDER_SITE_OTHER): Payer: Medicare HMO

## 2019-03-26 ENCOUNTER — Ambulatory Visit (INDEPENDENT_AMBULATORY_CARE_PROVIDER_SITE_OTHER): Payer: Medicare HMO | Admitting: Nurse Practitioner

## 2019-03-26 ENCOUNTER — Other Ambulatory Visit: Payer: Self-pay

## 2019-03-26 VITALS — BP 132/80 | HR 56 | Temp 98.3°F | Resp 16 | Ht <= 58 in | Wt 99.2 lb

## 2019-03-26 DIAGNOSIS — Z1211 Encounter for screening for malignant neoplasm of colon: Secondary | ICD-10-CM | POA: Diagnosis not present

## 2019-03-26 DIAGNOSIS — F419 Anxiety disorder, unspecified: Secondary | ICD-10-CM | POA: Diagnosis not present

## 2019-03-26 DIAGNOSIS — Z1239 Encounter for other screening for malignant neoplasm of breast: Secondary | ICD-10-CM

## 2019-03-26 DIAGNOSIS — Z0000 Encounter for general adult medical examination without abnormal findings: Secondary | ICD-10-CM | POA: Diagnosis not present

## 2019-03-26 MED ORDER — ALPRAZOLAM 0.5 MG PO TABS: 0.5000 mg | ORAL_TABLET | Freq: Every day | ORAL | 1 refills | Status: DC

## 2019-03-26 NOTE — Progress Notes (Signed)
BP 132/80   Pulse (!) 56   Temp 98.3 F (36.8 C) (Oral)   SpO2 99%    Subjective:    Patient ID: Brandy Chen, female    DOB: December 11, 1950, 68 y.o.   MRN: [id number]  HPI: Brandy Chen is a 68 y.o. female  Chief Complaint  Patient presents with  . Follow-up  . Anxiety   ANXIETY/STRESS Has been on Xanax at bedtime for long period.  She tried Sertraline, but reports this was not successful.  Wishes not to trial other medication at this time, would prefer to try herbal remedies. Pt is aware of risks of benzo  medication use to include increased sedation, respiratory suppression, falls,  dependence and cardiovascular events.  Pt would like to continue treatment as benefit.  Currently she is taking 1/2 tablet Xanax during day and 1/2 at night.  This works for her per her report and helps her mood.   Duration:stable Anxious mood: yes  Excessive worrying: no Irritability: no  Sweating: no Nausea: no Palpitations:no Hyperventilation: no Panic attacks: no Agoraphobia: no  Obscessions/compulsions: no Depressed mood: no Depression screen [facility] 2/9 03/26/2019 03/26/2019 10/23/2018 07/17/2018 04/03/2018  Decreased Interest 1 1 1  0 0  Down, Depressed, Hopeless 1 1 0 0 0  PHQ - 2 Score 2 2 1  0 0  Altered sleeping 0 0 1 1 0  Tired, decreased energy 1 1 1 1  0  Change in appetite 1 1 0 1 0  Feeling bad or failure about yourself  0 0 0 0 0  Trouble concentrating 1 1 1  0 0  Moving slowly or fidgety/restless 0 0 0 0 0  Suicidal thoughts 0 0 0 0 0  PHQ-9 Score 5 5 4 3  0  Difficult doing work/chores Somewhat difficult - Not difficult at all - -  Some recent data might be hidden   Anhedonia: no Weight changes: no Insomnia: none  Hypersomnia: no Fatigue/loss of energy: no Feelings of worthlessness: no Feelings of guilt: no Impaired concentration/indecisiveness: no Suicidal ideations: no  Crying spells: no Recent Stressors/Life Changes: no   Relationship problems: no   Family stress: no      Financial stress: no    Job stress: no    Recent death/loss: no  GAD 7 : Generalized Anxiety Score 03/26/2019 10/23/2018 07/17/2018  Nervous, Anxious, on Edge 1 1 1   Control/stop worrying 1 0 0  Worry too much - different things 1 1 1   Trouble relaxing 1 1 1   Restless 0 0 0  Easily annoyed or irritable 1 1 1   Afraid - awful might happen 0 0 0  Total GAD 7 Score 5 4 4   Anxiety Difficulty Somewhat difficult Not difficult at all -    Relevant past medical, surgical, family and social history reviewed and updated as indicated. Interim medical history since our last visit reviewed. Allergies and medications reviewed and updated.  Review of Systems  Constitutional: Negative for activity change, appetite change, diaphoresis, fatigue and fever.  Respiratory: Negative for cough, chest tightness and shortness of breath.   Cardiovascular: Negative for chest pain, palpitations and leg swelling.  Gastrointestinal: Negative for abdominal distention, abdominal pain, constipation, diarrhea, nausea and vomiting.  Neurological: Negative for dizziness, syncope, weakness, light-headedness, numbness and headaches.  Psychiatric/Behavioral: Negative.     Per HPI unless specifically indicated above     Objective:    BP 132/80   Pulse (!) 56   Temp 98.3 F (36.8 C) (Oral)  SpO2 99%   Wt Readings from Last 3 Encounters:  12/15/18 99 lb (44.9 kg)  10/23/18 99 lb (44.9 kg)  07/17/18 98 lb 8 oz (44.7 kg)    Physical Exam Vitals signs and nursing note reviewed.  Constitutional:      General: She is awake. She is not in acute distress.    Appearance: She is well-developed. She is not ill-appearing.  HENT:     Head: Normocephalic.     Right Ear: Hearing normal.     Left Ear: Hearing normal.     Nose: Nose normal.     Mouth/Throat:     Mouth: Mucous membranes are moist.  Eyes:     General: Lids are normal.        Right eye: No discharge.        Left eye: No discharge.      Conjunctiva/sclera: Conjunctivae normal.     Pupils: Pupils are equal, round, and reactive to light.  Neck:     Musculoskeletal: Normal range of motion and neck supple.     Thyroid: No thyromegaly.     Vascular: No carotid bruit or JVD.  Cardiovascular:     Rate and Rhythm: Normal rate and regular rhythm.     Heart sounds: Normal heart sounds. No murmur. No gallop.   Pulmonary:     Effort: Pulmonary effort is normal. No accessory muscle usage or respiratory distress.     Breath sounds: Normal breath sounds.  Abdominal:     General: Bowel sounds are normal.     Palpations: Abdomen is soft. There is no hepatomegaly or splenomegaly.  Musculoskeletal:     Right lower leg: No edema.     Left lower leg: No edema.  Lymphadenopathy:     Cervical: No cervical adenopathy.  Skin:    General: Skin is warm and dry.  Neurological:     Mental Status: She is alert and oriented to person, place, and time.  Psychiatric:        Attention and Perception: Attention normal.        Mood and Affect: Mood normal.        Behavior: Behavior normal. Behavior is cooperative.        Thought Content: Thought content normal.        Judgment: Judgment normal.     Results for orders placed or performed in visit on 81/85/63  Basic Metabolic Panel (BMET)  Result Value Ref Range   Glucose 91 65 - 99 mg/dL   BUN 13 8 - 27 mg/dL   Creatinine, Ser 0.71 0.57 - 1.00 mg/dL   GFR calc non Af Amer 87 >59 mL/min/1.73   GFR calc Af Amer 100 >59 mL/min/1.73   BUN/Creatinine Ratio 18 12 - 28   Sodium 143 134 - 144 mmol/L   Potassium 4.7 3.5 - 5.2 mmol/L   Chloride 101 96 - 106 mmol/L   CO2 24 20 - 29 mmol/L   Calcium 9.6 8.7 - 10.3 mg/dL  Thyroid Panel With TSH  Result Value Ref Range   TSH 6.710 (H) 0.450 - 4.500 uIU/mL   T4, Total 7.4 4.5 - 12.0 ug/dL   T3 Uptake Ratio 25 24 - 39 %   Free Thyroxine Index 1.9 1.2 - 4.9  VITAMIN D 25 Hydroxy (Vit-D Deficiency, Fractures)  Result Value Ref Range   Vit D,  25-Hydroxy 49.3 30.0 - 100.0 ng/mL  Lipid Panel w/o Chol/HDL Ratio  Result Value Ref Range   Cholesterol, Total  235 (H) 100 - 199 mg/dL   Triglycerides 123 0 - 149 mg/dL   HDL 74 >39 mg/dL   VLDL Cholesterol Cal 25 5 - 40 mg/dL   LDL Calculated 136 (H) 0 - 99 mg/dL  Urine drugs of abuse scrn w alc, routine (Ref Lab)  Result Value Ref Range   Amphetamines, Urine Negative Cutoff=1000 ng/mL   Barbiturate Quant, Ur Negative Cutoff=300 ng/mL   Benzodiazepine Quant, Ur Negative Cutoff=300 ng/mL   Cannabinoid Quant, Ur Negative Cutoff=50 ng/mL   Cocaine (Metab.) Negative Cutoff=300 ng/mL   Opiate Quant, Ur Negative Cutoff=300 ng/mL   PCP Quant, Ur Negative Cutoff=25 ng/mL   Methadone Screen, Urine Negative Cutoff=300 ng/mL   Propoxyphene Negative Cutoff=300 ng/mL   Ethanol, Urine Negative Cutoff=0.020 %      Assessment & Plan:   Problem List Items Addressed This Visit      Other   Chronic anxiety    Chronic, ongoing for years and stable.  Denies SI/HI.  Has been on Xanax for several years.  Refills sent today #30 x 2.  Return in 3 months.           Controlled substance.  Checked data base and no other refills of controlled substances noted, last Xanax fill 2 weeks ago.  Follow up plan: Return in about 3 months (around 06/26/2019) for Anxiety and hypothyroidism + HTN.

## 2019-03-26 NOTE — Progress Notes (Signed)
Subjective:   Brandy Chen is a 68 y.o. female who presents for Medicare Annual (Subsequent) preventive examination.  Review of Systems:     Cardiac Risk Factors include: advanced age (>77men, >50 women)     Objective:     Vitals: BP 132/80 (BP Location: Left Arm, Patient Position: Sitting, Cuff Size: Normal)   Pulse (!) 56   Temp 98.3 F (36.8 C) (Oral)   Resp 16   Ht 4\' 10"  (1.473 m)   Wt 99 lb 3.2 oz (45 kg)   BMI 20.73 kg/m   Body mass index is 20.73 kg/m.  Advanced Directives 03/26/2019 03/20/2018 02/17/2017 02/16/2016  Does Patient Have a Medical Advance Directive? No No No No  Would patient like information on creating a medical advance directive? - Yes (MAU/Ambulatory/Procedural Areas - Information given) Yes (MAU/Ambulatory/Procedural Areas - Information given) -    Tobacco Social History   Tobacco Use  Smoking Status Never Smoker  Smokeless Tobacco Never Used     Counseling given: Not Answered   Clinical Intake:  Pre-visit preparation completed: Yes  Pain : No/denies pain     Nutritional Risks: None Diabetes: No  How often do you need to have someone help you when you read instructions, pamphlets, or other written materials from your doctor or pharmacy?: 1 - Never What is the last grade level you completed in school?: high school  Interpreter Needed?: No  Information entered by :: Arlen Dupuis,LPN  Past Medical History:  Diagnosis Date  . Anxiety   . Depression   . Endometriosis   . Fatigue   . Hyperlipidemia   . Hypertension   . Insomnia   . Lumbago   . Osteoporosis   . Thyroid disease   . Vitamin D deficiency    Past Surgical History:  Procedure Laterality Date  . CESAREAN SECTION    . pyloric stenosis    . TUBAL LIGATION     Family History  Problem Relation Age of Onset  . Stroke Mother   . Hypertension Mother   . Stroke Maternal Grandfather   . Hypertension Sister    Social History   Socioeconomic History  . Marital  status: Married    Spouse name: Not on file  . Number of children: Not on file  . Years of education: Not on file  . Highest education level: High school graduate  Occupational History  . Not on file  Social Needs  . Financial resource strain: Not hard at all  . Food insecurity    Worry: Never true    Inability: Never true  . Transportation needs    Medical: No    Non-medical: No  Tobacco Use  . Smoking status: Never Smoker  . Smokeless tobacco: Never Used  Substance and Sexual Activity  . Alcohol use: No    Alcohol/week: 0.0 standard drinks  . Drug use: No  . Sexual activity: Not Currently  Lifestyle  . Physical activity    Days per week: 5 days    Minutes per session: 30 min  . Stress: Not at all  Relationships  . Social connections    Talks on phone: More than three times a week    Gets together: Three times a week    Attends religious service: Never    Active member of club or organization: No    Attends meetings of clubs or organizations: Never    Relationship status: Married  Other Topics Concern  . Not on file  Social History Narrative  . Not on file    Outpatient Encounter Medications as of 03/26/2019  Medication Sig  . aspirin EC 81 MG tablet Take 81 mg by mouth.  . Calcium Carb-Cholecalciferol (CALCIUM 600+D) 600-800 MG-UNIT TABS Take 1 tablet by mouth 2 (two) times daily.  Javier Docker Oil 300 MG CAPS Take 300 mg by mouth daily.  . Magnesium 500 MG TABS Take 500 mg by mouth daily.  . meclizine (ANTIVERT) 25 MG tablet Take 1 tablet (25 mg total) by mouth 3 (three) times daily as needed for dizziness.  . vitamin C (ASCORBIC ACID) 500 MG tablet Take 500 mg by mouth daily.  . [DISCONTINUED] ALPRAZolam (XANAX) 0.5 MG tablet Take 1 tablet (0.5 mg total) by mouth at bedtime.   No facility-administered encounter medications on file as of 03/26/2019.     Activities of Daily Living In your present state of health, do you have any difficulty performing the following  activities: 03/26/2019 12/15/2018  Hearing? N N  Vision? N N  Difficulty concentrating or making decisions? N N  Walking or climbing stairs? N N  Dressing or bathing? N N  Doing errands, shopping? N N  Preparing Food and eating ? N -  Using the Toilet? N -  In the past six months, have you accidently leaked urine? N -  Do you have problems with loss of bowel control? N -  Managing your Medications? N -  Managing your Finances? N -  Housekeeping or managing your Housekeeping? N -  Some recent data might be hidden    Patient Care Team: Venita Lick, NP as PCP - General (Nurse Practitioner)    Assessment:   This is a routine wellness examination for [place].  Exercise Activities and Dietary recommendations Current Exercise Habits: Home exercise routine, Type of exercise: walking, Time (Minutes): 30, Frequency (Times/Week): 5, Weekly Exercise (Minutes/Week): 150, Intensity: Mild, Exercise limited by: None identified  Goals    . DIET - INCREASE WATER INTAKE     Recommend drinking at least 6-8 glasses of water a day     . Increase water intake     Recommend drinking 3-4 glasses of water a day.       Fall Risk: Fall Risk  03/26/2019 12/15/2018 03/20/2018 09/12/2017 02/17/2017  Falls in the past year? 1 1 No No No  Number falls in past yr: 0 0 - - -  Injury with Fall? 1 1 - - -    FALL RISK PREVENTION PERTAINING TO THE HOME:  Any stairs in or around the home? Yes  If so, are there any without handrails? No   Home free of loose throw rugs in walkways, pet beds, electrical cords, etc? Yes  Adequate lighting in your home to reduce risk of falls? Yes   ASSISTIVE DEVICES UTILIZED TO PREVENT FALLS:  Life alert? No  Use of a cane, walker or w/c? No  Grab bars in the bathroom? No  Shower chair or bench in shower? No  Elevated toilet seat or a handicapped toilet? No   DME ORDERS:  DME order needed?  No   TIMED UP AND GO:  Was the test performed? Yes .  Length of time to  ambulate 10 feet: 10 sec.   GAIT:  Appearance of gait: Gait steady and fast without the use of an assistive device.  Education: Fall risk prevention has been discussed.  Intervention(s) required? No   DME/home health order needed?  No  Depression Screen PHQ 2/9 Scores 03/26/2019 03/26/2019 10/23/2018 07/17/2018  PHQ - 2 Score 2 2 1  0  PHQ- 9 Score 5 5 4 3      Cognitive Function     6CIT Screen 03/26/2019 03/20/2018 02/17/2017  What Year? 0 points 0 points 0 points  What month? 0 points 0 points 0 points  What time? 0 points 0 points 0 points  Count back from 20 0 points 0 points 0 points  Months in reverse 0 points 0 points 0 points  Repeat phrase 0 points 0 points 0 points  Total Score 0 0 0    Immunization History  Administered Date(s) Administered  . Influenza, High Dose Seasonal PF 08/23/2016, 08/04/2017, 07/17/2018  . Influenza-Unspecified 06/27/2015  . Pneumococcal Conjugate-13 02/16/2016    Qualifies for Shingles Vaccine? Yes  Zostavax completed n/a. Due for Shingrix. Education has been provided regarding the importance of this vaccine. Pt has been advised to call insurance company to determine out of pocket expense. Advised may also receive vaccine at local pharmacy or Health Dept. Verbalized acceptance and understanding.  Tdap: Although this vaccine is not a covered service during a Wellness Exam, does the patient still wish to receive this vaccine today?  No .  Education has been provided regarding the importance of this vaccine. Advised may receive this vaccine at local pharmacy or Health Dept. Aware to provide a copy of the vaccination record if obtained from local pharmacy or Health Dept. Verbalized acceptance and understanding.  Flu Vaccine: up to date   Pneumococcal Vaccine: Due for Pneumococcal vaccine. Does the patient want to receive this vaccine today?  No . Education has been provided regarding the importance of this vaccine but still declined. Advised may  receive this vaccine at local pharmacy or Health Dept. Aware to provide a copy of the vaccination record if obtained from local pharmacy or Health Dept. Verbalized acceptance and understanding.   Screening Tests Health Maintenance  Topic Date Due  . Fecal DNA (Cologuard)  03/02/2019  . TETANUS/TDAP  04/04/2019 (Originally 12/17/1969)  . PNA vac Low Risk Adult (2 of 2 - PPSV23) 04/04/2019 (Originally 02/15/2017)  . INFLUENZA VACCINE  04/28/2019  . MAMMOGRAM  05/02/2020  . DEXA SCAN  Completed  . Hepatitis C Screening  Completed    Cancer Screenings:  Colorectal Screening: cologuard completed 03/01/2016, due now.   Mammogram: Completed 05/02/2018. Repeat every year; No longer required. Ordered   Bone Density: Completed 02/18/2015  Lung Cancer Screening: (Low Dose CT Chest recommended if Age 66-80 years, 30 pack-year currently smoking OR have quit w/in 15years.) does not qualify.   Additional Screening:  Hepatitis C Screening: does qualify; Completed 04/03/2018  Vision Screening: Recommended annual ophthalmology exams for early detection of glaucoma and other disorders of the eye. Is the patient up to date with their annual eye exam?  Yes  Who is the provider or what is the name of the office in which the pt attends annual eye exams? Dr.Woodard  Dental Screening: Recommended annual dental exams for proper oral hygiene  Community Resource Referral:  CRR required this visit?  No       Plan:  I have personally reviewed and addressed the Medicare Annual Wellness questionnaire and have noted the following in the patient's chart:  A. Medical and social history B. Use of alcohol, tobacco or illicit drugs  C. Current medications and supplements D. Functional ability and status E.  Nutritional status F.  Physical activity G. Advance directives H. List  of other physicians I.  Hospitalizations, surgeries, and ER visits in previous 12 months J.  [place] such as hearing and  vision if needed, cognitive and depression L. Referrals and appointments   In addition, I have reviewed and discussed with patient certain preventive protocols, quality metrics, and best practice recommendations. A written personalized care plan for preventive services as well as general preventive health recommendations were provided to patient.   Signed,    Bevelyn Ngo, LPN  6/76/7209 Nurse Health Advisor   Nurse Notes: none

## 2019-03-26 NOTE — Assessment & Plan Note (Signed)
Chronic, ongoing for years and stable.  Denies SI/HI.  Has been on Xanax for several years.  Refills sent today #30 x 2.  Return in 3 months.

## 2019-03-26 NOTE — Patient Instructions (Signed)

## 2019-03-26 NOTE — Patient Instructions (Addendum)
[name] , Thank you for taking time to come for your Medicare Wellness Visit. I appreciate your ongoing commitment to your health goals. Please review the following plan we discussed and let me know if I can assist you in the future.   Screening recommendations/referrals: Colonoscopy: cologuard completed 03/01/2016 Mammogram: completed 05/02/2018 Please call [phone] to schedule your mammogram.  Bone Density: completed 03/21/2015 Recommended yearly ophthalmology/optometry visit for glaucoma screening and checkup Recommended yearly dental visit for hygiene and checkup  Vaccinations: Influenza vaccine: up to date Pneumococcal vaccine: declined pneumovax 23  Tdap vaccine: due, check with your insurance company for coverage Shingles vaccine: shingrix eligible, check with your insurance company for coverage information    Advanced directives: please let us know if you have any questions or need assistance completing this paperwork.   Conditions/risks identified: none   Next appointment: Follow up in one year for your annual wellness exam.    Preventive Care 65 Years and Older, Female Preventive care refers to lifestyle choices and visits with your health care provider that can promote health and wellness. What does preventive care include?  A yearly physical exam. This is also called an annual well check.  Dental exams once or twice a year.  Routine eye exams. Ask your health care provider how often you should have your eyes checked.  Personal lifestyle choices, including:  Daily care of your teeth and gums.  Regular physical activity.  Eating a healthy diet.  Avoiding tobacco and drug use.  Limiting alcohol use.  Practicing safe sex.  Taking low-dose aspirin every day.  Taking vitamin and mineral supplements as recommended by your health care provider. What happens during an annual well check? The services and screenings done by your health care provider during your  annual well check will depend on your age, overall health, lifestyle risk factors, and family history of disease. Counseling  Your health care provider may ask you questions about your:  Alcohol use.  Tobacco use.  Drug use.  Emotional well-being.  Home and relationship well-being.  Sexual activity.  Eating habits.  History of falls.  Memory and ability to understand (cognition).  Work and work Statistician.  Reproductive health. Screening  You may have the following tests or measurements:  Height, weight, and BMI.  Blood pressure.  Lipid and cholesterol levels. These may be checked every 5 years, or more frequently if you are over 80 years old.  Skin check.  Lung cancer screening. You may have this screening every year starting at age 53 if you have a 30-pack-year history of smoking and currently smoke or have quit within the past 15 years.  Fecal occult blood test (FOBT) of the stool. You may have this test every year starting at age 28.  Flexible sigmoidoscopy or colonoscopy. You may have a sigmoidoscopy every 5 years or a colonoscopy every 10 years starting at age 67.  Hepatitis C blood test.  Hepatitis B blood test.  Sexually transmitted disease (STD) testing.  Diabetes screening. This is done by checking your blood sugar (glucose) after you have not eaten for a while (fasting). You may have this done every 1-3 years.  Bone density scan. This is done to screen for osteoporosis. You may have this done starting at age 44.  Mammogram. This may be done every 1-2 years. Talk to your health care provider about how often you should have regular mammograms. Talk with your health care provider about your test results, treatment options, and if necessary, the  need for more tests. Vaccines  Your health care provider may recommend certain vaccines, such as:  Influenza vaccine. This is recommended every year.  Tetanus, diphtheria, and acellular pertussis (Tdap, Td)  vaccine. You may need a Td booster every 10 years.  Zoster vaccine. You may need this after age 59.  Pneumococcal 13-valent conjugate (PCV13) vaccine. One dose is recommended after age 29.  Pneumococcal polysaccharide (PPSV23) vaccine. One dose is recommended after age 49. Talk to your health care provider about which screenings and vaccines you need and how often you need them. This information is not intended to replace advice given to you by your health care provider. Make sure you discuss any questions you have with your health care provider. Document Released: 10/10/2015 Document Revised: 06/02/2016 Document Reviewed: 07/15/2015 Elsevier Interactive Patient Education  2017 [employer] Prevention in the Home Falls can cause injuries. They can happen to people of all ages. There are many things you can do to make your home safe and to help prevent falls. What can I do on the outside of my home?  Regularly fix the edges of walkways and driveways and fix any cracks.  Remove anything that might make you trip as you walk through a door, such as a raised step or threshold.  Trim any bushes or trees on the path to your home.  Use bright outdoor lighting.  Clear any walking paths of anything that might make someone trip, such as rocks or tools.  Regularly check to see if handrails are loose or broken. Make sure that both sides of any steps have handrails.  Any raised decks and porches should have guardrails on the edges.  Have any leaves, snow, or ice cleared regularly.  Use sand or salt on walking paths during winter.  Clean up any spills in your garage right away. This includes oil or grease spills. What can I do in the bathroom?  Use night lights.  Install grab bars by the toilet and in the tub and shower. Do not use towel bars as grab bars.  Use non-skid mats or decals in the tub or shower.  If you need to sit down in the shower, use a plastic, non-slip stool.   Keep the floor dry. Clean up any water that spills on the floor as soon as it happens.  Remove soap buildup in the tub or shower regularly.  Attach bath mats securely with double-sided non-slip rug tape.  Do not have throw rugs and other things on the floor that can make you trip. What can I do in the bedroom?  Use night lights.  Make sure that you have a light by your bed that is easy to reach.  Do not use any sheets or blankets that are too big for your bed. They should not hang down onto the floor.  Have a firm chair that has side arms. You can use this for support while you get dressed.  Do not have throw rugs and other things on the floor that can make you trip. What can I do in the kitchen?  Clean up any spills right away.  Avoid walking on wet floors.  Keep items that you use a lot in easy-to-reach places.  If you need to reach something above you, use a strong step stool that has a grab bar.  Keep electrical cords out of the way.  Do not use floor polish or wax that makes floors slippery. If you must use wax,  use non-skid floor wax.  Do not have throw rugs and other things on the floor that can make you trip. What can I do with my stairs?  Do not leave any items on the stairs.  Make sure that there are handrails on both sides of the stairs and use them. Fix handrails that are broken or loose. Make sure that handrails are as long as the stairways.  Check any carpeting to make sure that it is firmly attached to the stairs. Fix any carpet that is loose or worn.  Avoid having throw rugs at the top or bottom of the stairs. If you do have throw rugs, attach them to the floor with carpet tape.  Make sure that you have a light switch at the top of the stairs and the bottom of the stairs. If you do not have them, ask someone to add them for you. What else can I do to help prevent falls?  Wear shoes that:  Do not have high heels.  Have rubber bottoms.  Are  comfortable and fit you well.  Are closed at the toe. Do not wear sandals.  If you use a stepladder:  Make sure that it is fully opened. Do not climb a closed stepladder.  Make sure that both sides of the stepladder are locked into place.  Ask someone to hold it for you, if possible.  Clearly mark and make sure that you can see:  Any grab bars or handrails.  First and last steps.  Where the edge of each step is.  Use tools that help you move around (mobility aids) if they are needed. These include:  Canes.  Walkers.  Scooters.  Crutches.  Turn on the lights when you go into a dark area. Replace any light bulbs as soon as they burn out.  Set up your furniture so you have a clear path. Avoid moving your furniture around.  If any of your floors are uneven, fix them.  If there are any pets around you, be aware of where they are.  Review your medicines with your doctor. Some medicines can make you feel dizzy. This can increase your chance of falling. Ask your doctor what other things that you can do to help prevent falls. This information is not intended to replace advice given to you by your health care provider. Make sure you discuss any questions you have with your health care provider. Document Released: 07/10/2009 Document Revised: 02/19/2016 Document Reviewed: 10/18/2014 Elsevier Interactive Patient Education  2017 [employer].

## 2019-05-17 ENCOUNTER — Encounter: Payer: Self-pay | Admitting: Family Medicine

## 2019-05-24 ENCOUNTER — Telehealth: Payer: Self-pay | Admitting: Nurse Practitioner

## 2019-05-24 NOTE — Chronic Care Management (AMB) (Signed)
°  Chronic Care Management   Outreach Note  05/24/2019 Name: Brandy Chen MRN: [id number] DOB: 01-16-1951  Referred by: Venita Lick, NP Reason for referral : Chronic Care Management (Third CCM outreach was unsuccessful.)   Third unsuccessful telephone outreach was attempted today. The patient was referred to the case management team for assistance with chronic care management and care coordination. The patient's primary care provider has been notified of our unsuccessful attempts to make or maintain contact with the patient. The care management team is pleased to engage with this patient at any time in the future should he/she be interested in assistance from the care management team.   Follow Up Plan: The care management team is available to follow up with the patient after provider conversation with the patient regarding recommendation for care management engagement and subsequent re-referral to the care management team.   [place]  [email or web address]   ??[phone]

## 2019-05-26 LAB — IFOBT (OCCULT BLOOD): IFOBT: NEGATIVE

## 2019-06-13 ENCOUNTER — Other Ambulatory Visit: Payer: Self-pay | Admitting: Nurse Practitioner

## 2019-06-14 NOTE — Telephone Encounter (Signed)
Patient last seen 03/26/19 and has appointment 06/25/19.

## 2019-06-25 ENCOUNTER — Other Ambulatory Visit: Payer: Self-pay

## 2019-06-25 ENCOUNTER — Ambulatory Visit (INDEPENDENT_AMBULATORY_CARE_PROVIDER_SITE_OTHER): Payer: Medicare HMO | Admitting: Nurse Practitioner

## 2019-06-25 ENCOUNTER — Encounter: Payer: Self-pay | Admitting: Nurse Practitioner

## 2019-06-25 VITALS — BP 118/79 | HR 64 | Temp 98.3°F

## 2019-06-25 DIAGNOSIS — E7800 Pure hypercholesterolemia, unspecified: Secondary | ICD-10-CM | POA: Diagnosis not present

## 2019-06-25 DIAGNOSIS — I10 Essential (primary) hypertension: Secondary | ICD-10-CM

## 2019-06-25 DIAGNOSIS — Z23 Encounter for immunization: Secondary | ICD-10-CM | POA: Diagnosis not present

## 2019-06-25 DIAGNOSIS — E039 Hypothyroidism, unspecified: Secondary | ICD-10-CM | POA: Diagnosis not present

## 2019-06-25 DIAGNOSIS — F419 Anxiety disorder, unspecified: Secondary | ICD-10-CM | POA: Diagnosis not present

## 2019-06-25 NOTE — Progress Notes (Signed)
BP 118/79   Pulse 64   Temp 98.3 F (36.8 C) (Oral)   SpO2 97%    Subjective:    Patient ID: Brandy Chen, female    DOB: 1951-01-24, 68 y.o.   MRN: [id number]  HPI: Brandy Chen is a 68 y.o. female  Chief Complaint  Patient presents with  . Anxiety  . Hypertension  . Hypothyroidism   HYPERTENSION / HYPERLIPIDEMIA No current medications, other than fish oil and ASA.  Refuses statin or BP medications. Satisfied with current treatment? yes Duration of hypertension: chronic BP monitoring frequency: daily BP range: 120/70's BP medication side effects: no Duration of hyperlipidemia: chronic Cholesterol supplements: fish oil Medication compliance: good compliance Aspirin: no Recent stressors: no Recurrent headaches: no Visual changes: no Palpitations: no Dyspnea: no Chest pain: no Lower extremity edema: no Dizzy/lightheaded: no  The 10-year ASCVD risk score [name], et al., 2013) is: 8.1%   Values used to calculate the score:     Age: 1 years     Sex: Female     Is Non-Hispanic African American: No     Diabetic: No     Tobacco smoker: No     Systolic Blood Pressure: 123456 mmHg     Is BP treated: No     HDL Cholesterol: 74 mg/dL     Total Cholesterol: 235 mg/dL  HYPOTHYROIDISM Remains subclinical at baseline, no current medications. January 6.710.   Thyroid control status:stable Fatigue: no Cold intolerance: no Heat intolerance: no Weight gain: no Weight loss: no Constipation: no Diarrhea/loose stools: no Palpitations: no Lower extremity edema: no Anxiety/depressed mood: no   ANXIETY/STRESS Has been on Xanax at bedtime for long period. She tried Sertraline, but reports this was not successful. Wishes not to trial other medication at this time, would prefer to try herbal remedies. Discussed option of Trazodone for sleep.  Pt isaware of risks ofbenzomedication use to include increased sedation, respiratory suppression, falls, dependence and  cardiovascular events. Ptwould like to continue treatment as benefit.  Currently she is taking 1/2 tablet Xanax during day and 1/2 at night.  This works for her per her report and helps her mood.  Again reviewed BEERS criteria with her and she is thinking about trying to slowly decrease use of Xanax on her own, discussed to try this slowly. Duration:stable Anxious mood: no  Excessive worrying: no Irritability: no  Sweating: no Nausea: no Palpitations:no Hyperventilation: no Panic attacks: no Agoraphobia: no  Obscessions/compulsions: no Depressed mood: no Depression screen [facility] 2/9 06/25/2019 03/26/2019 03/26/2019 10/23/2018 07/17/2018  Decreased Interest 1 1 1 1  0  Down, Depressed, Hopeless 0 1 1 0 0  PHQ - 2 Score 1 2 2 1  0  Altered sleeping 0 0 0 1 1  Tired, decreased energy 1 1 1 1 1   Change in appetite 1 1 1  0 1  Feeling bad or failure about yourself  0 0 0 0 0  Trouble concentrating 0 1 1 1  0  Moving slowly or fidgety/restless 0 0 0 0 0  Suicidal thoughts 0 0 0 0 0  PHQ-9 Score 3 5 5 4 3   Difficult doing work/chores Not difficult at all Somewhat difficult - Not difficult at all -  Some recent data might be hidden   Anhedonia: no Weight changes: no Insomnia: yes hard to fall asleep  Hypersomnia: no Fatigue/loss of energy: no Feelings of worthlessness: no Feelings of guilt: no Impaired concentration/indecisiveness: no Suicidal ideations: no  Crying spells: no  Recent Stressors/Life Changes: no   Relationship problems: no   Family stress: no     Financial stress: no    Job stress: no    Recent death/loss: no  Relevant past medical, surgical, family and social history reviewed and updated as indicated. Interim medical history since our last visit reviewed. Allergies and medications reviewed and updated.  Review of Systems  Constitutional: Negative for activity change, appetite change, diaphoresis, fatigue and fever.  Respiratory: Negative for cough, chest tightness and  shortness of breath.   Cardiovascular: Negative for chest pain, palpitations and leg swelling.  Gastrointestinal: Negative for abdominal distention, abdominal pain, constipation, diarrhea, nausea and vomiting.  Endocrine: Negative for cold intolerance and heat intolerance.  Neurological: Negative for dizziness, syncope, weakness, light-headedness, numbness and headaches.  Psychiatric/Behavioral: Negative.     Per HPI unless specifically indicated above     Objective:    BP 118/79   Pulse 64   Temp 98.3 F (36.8 C) (Oral)   SpO2 97%   Wt Readings from Last 3 Encounters:  03/26/19 99 lb 3.2 oz (45 kg)  12/15/18 99 lb (44.9 kg)  10/23/18 99 lb (44.9 kg)    Physical Exam Vitals signs and nursing note reviewed.  Constitutional:      General: She is awake. She is not in acute distress.    Appearance: She is well-developed. She is not ill-appearing.  HENT:     Head: Normocephalic.     Right Ear: Hearing normal.     Left Ear: Hearing normal.  Eyes:     General: Lids are normal.        Right eye: No discharge.        Left eye: No discharge.     Conjunctiva/sclera: Conjunctivae normal.     Pupils: Pupils are equal, round, and reactive to light.  Neck:     Musculoskeletal: Normal range of motion and neck supple.     Thyroid: No thyromegaly.     Vascular: No carotid bruit or JVD.  Cardiovascular:     Rate and Rhythm: Normal rate and regular rhythm.     Heart sounds: Normal heart sounds. No murmur. No gallop.   Pulmonary:     Effort: Pulmonary effort is normal. No accessory muscle usage or respiratory distress.     Breath sounds: Normal breath sounds.  Abdominal:     General: Bowel sounds are normal.     Palpations: Abdomen is soft.  Musculoskeletal:     Right lower leg: No edema.     Left lower leg: No edema.  Lymphadenopathy:     Cervical: No cervical adenopathy.  Skin:    General: Skin is warm and dry.  Neurological:     Mental Status: She is alert and oriented to  person, place, and time.  Psychiatric:        Attention and Perception: Attention normal.        Mood and Affect: Mood normal.        Behavior: Behavior normal. Behavior is cooperative.        Thought Content: Thought content normal.        Judgment: Judgment normal.     Results for orders placed or performed in visit on [id number]  Basic Metabolic Panel (BMET)  Result Value Ref Range   Glucose 91 65 - 99 mg/dL   BUN 13 8 - 27 mg/dL   Creatinine, Ser 0.71 0.57 - 1.00 mg/dL   GFR calc non Af Amer 87 >59 mL/min/1.73  GFR calc Af Amer 100 >59 mL/min/1.73   BUN/Creatinine Ratio 18 12 - 28   Sodium 143 134 - 144 mmol/L   Potassium 4.7 3.5 - 5.2 mmol/L   Chloride 101 96 - 106 mmol/L   CO2 24 20 - 29 mmol/L   Calcium 9.6 8.7 - 10.3 mg/dL  Thyroid Panel With TSH  Result Value Ref Range   TSH 6.710 (H) 0.450 - 4.500 uIU/mL   T4, Total 7.4 4.5 - 12.0 ug/dL   T3 Uptake Ratio 25 24 - 39 %   Free Thyroxine Index 1.9 1.2 - 4.9  VITAMIN D 25 Hydroxy (Vit-D Deficiency, Fractures)  Result Value Ref Range   Vit D, 25-Hydroxy 49.3 30.0 - 100.0 ng/mL  Lipid Panel w/o Chol/HDL Ratio  Result Value Ref Range   Cholesterol, Total 235 (H) 100 - 199 mg/dL   Triglycerides 123 0 - 149 mg/dL   HDL 74 >39 mg/dL   VLDL Cholesterol Cal 25 5 - 40 mg/dL   LDL Calculated 136 (H) 0 - 99 mg/dL  Urine drugs of abuse scrn w alc, routine (Ref Lab)  Result Value Ref Range   Amphetamines, Urine Negative Cutoff=1000 ng/mL   Barbiturate Quant, Ur Negative Cutoff=300 ng/mL   Benzodiazepine Quant, Ur Negative Cutoff=300 ng/mL   Cannabinoid Quant, Ur Negative Cutoff=50 ng/mL   Cocaine (Metab.) Negative Cutoff=300 ng/mL   Opiate Quant, Ur Negative Cutoff=300 ng/mL   PCP Quant, Ur Negative Cutoff=25 ng/mL   Methadone Screen, Urine Negative Cutoff=300 ng/mL   Propoxyphene Negative Cutoff=300 ng/mL   Ethanol, Urine Negative Cutoff=0.020 %      Assessment & Plan:   Problem List Items Addressed This Visit       Cardiovascular and Mediastinum   Hypertension    Chronic, stable.  BP at goal at home and in office, suspect more related to white coat syndrome.  Continue current diet control.  Consider Lisinopril if elevation in BP >140/90 consistently at home and office.        Endocrine   Hypothyroidism    Subclinical at baseline.  Refuses labs today, agrees to recheck in January.         Other   Hypercholesteremia    Chronic, refuses statin use.  Refuses labs today, agrees to recheck in January.  Continue ongoing discussions.      Chronic anxiety - Primary    Chronic, ongoing for years and stable.  Denies SI/HI.  Has been on Xanax for several years.  Refills sent today #30 x 2.  Return in 3 months.  UDS need in January.  Has contract on file.       Other Visit Diagnoses    Need for influenza vaccination       Relevant Orders   Flu Vaccine QUAD High Dose(Fluad) (Completed)      Controlled substance.  Checked data base and no other refills of controlled substances noted, last Xanax fill 06/14/2019.  Follow up plan: Return in about 3 months (around 09/24/2019) for Anxiety, HTN/HLD, Thyroid check.

## 2019-06-25 NOTE — Patient Instructions (Signed)

## 2019-06-25 NOTE — Assessment & Plan Note (Signed)
Chronic, refuses statin use.  Refuses labs today, agrees to recheck in January.  Continue ongoing discussions.

## 2019-06-25 NOTE — Assessment & Plan Note (Signed)
Chronic, stable.  BP at goal at home and in office, suspect more related to white coat syndrome.  Continue current diet control.  Consider Lisinopril if elevation in BP >140/90 consistently at home and office.

## 2019-06-25 NOTE — Assessment & Plan Note (Signed)
Subclinical at baseline.  Refuses labs today, agrees to recheck in January.

## 2019-06-25 NOTE — Assessment & Plan Note (Signed)
Chronic, ongoing for years and stable.  Denies SI/HI.  Has been on Xanax for several years.  Refills sent today #30 x 2.  Return in 3 months.  UDS need in January.  Has contract on file.

## 2019-07-12 DIAGNOSIS — Z1231 Encounter for screening mammogram for malignant neoplasm of breast: Secondary | ICD-10-CM | POA: Diagnosis not present

## 2019-07-12 LAB — HM MAMMOGRAPHY

## 2019-08-15 DIAGNOSIS — H2513 Age-related nuclear cataract, bilateral: Secondary | ICD-10-CM | POA: Diagnosis not present

## 2019-08-15 DIAGNOSIS — H43812 Vitreous degeneration, left eye: Secondary | ICD-10-CM | POA: Diagnosis not present

## 2019-09-23 ENCOUNTER — Other Ambulatory Visit: Payer: Self-pay | Admitting: Nurse Practitioner

## 2019-09-24 NOTE — Telephone Encounter (Signed)
Patient last seen 06/25/19 and has appointment 10/08/19

## 2019-10-08 ENCOUNTER — Encounter: Payer: Self-pay | Admitting: Nurse Practitioner

## 2019-10-08 ENCOUNTER — Other Ambulatory Visit: Payer: Self-pay

## 2019-10-08 ENCOUNTER — Ambulatory Visit (INDEPENDENT_AMBULATORY_CARE_PROVIDER_SITE_OTHER): Payer: Medicare HMO | Admitting: Nurse Practitioner

## 2019-10-08 VITALS — BP 144/71 | HR 64 | Temp 97.5°F

## 2019-10-08 DIAGNOSIS — I10 Essential (primary) hypertension: Secondary | ICD-10-CM | POA: Diagnosis not present

## 2019-10-08 DIAGNOSIS — E7800 Pure hypercholesterolemia, unspecified: Secondary | ICD-10-CM

## 2019-10-08 DIAGNOSIS — E039 Hypothyroidism, unspecified: Secondary | ICD-10-CM

## 2019-10-08 DIAGNOSIS — E559 Vitamin D deficiency, unspecified: Secondary | ICD-10-CM

## 2019-10-08 DIAGNOSIS — Z79899 Other long term (current) drug therapy: Secondary | ICD-10-CM | POA: Diagnosis not present

## 2019-10-08 DIAGNOSIS — M810 Age-related osteoporosis without current pathological fracture: Secondary | ICD-10-CM

## 2019-10-08 DIAGNOSIS — F419 Anxiety disorder, unspecified: Secondary | ICD-10-CM | POA: Diagnosis not present

## 2019-10-08 NOTE — Assessment & Plan Note (Signed)
Chronic, refuses statin use.  Obtain lipid panel today and continue to recommend diet focus.

## 2019-10-08 NOTE — Assessment & Plan Note (Signed)
Subclinical at baseline.  Labs today, does not wish to start medication at this time, but will consider dependent on labs.

## 2019-10-08 NOTE — Patient Instructions (Signed)

## 2019-10-08 NOTE — Assessment & Plan Note (Signed)
Check UDS today and continued ongoing discussions about risks with long term benzo use.  She refuses to try alternate medications. 

## 2019-10-08 NOTE — Progress Notes (Signed)
BP (!) 144/71 (BP Location: Left Arm, Patient Position: Sitting, Cuff Size: Normal)   Pulse 64   Temp (!) 97.5 F (36.4 C) (Oral)   SpO2 98%    Subjective:    Patient ID: Brandy Chen, female    DOB: 1950/10/11, 69 y.o.   MRN: [id number]  HPI: AKAILA PALANCA is a 69 y.o. female  Chief Complaint  Patient presents with  . Follow-up   HYPERTENSION / HYPERLIPIDEMIA No current medications, other than fish oil and ASA.  Refuses statin or BP medications. Satisfied with current treatment? yes Duration of hypertension: chronic BP monitoring frequency: daily BP range: 120-130/70's BP medication side effects: no Duration of hyperlipidemia: chronic Cholesterol supplements: fish oil Medication compliance: good compliance Aspirin: yes Recent stressors: no Recurrent headaches: no Visual changes: no Palpitations: no Dyspnea: no Chest pain: no Lower extremity edema: no Dizzy/lightheaded: no The 10-year ASCVD risk score [name], et al., 2013) is: 11.7%   Values used to calculate the score:     Age: 80 years     Sex: Female     Is Non-Hispanic African American: No     Diabetic: No     Tobacco smoker: No     Systolic Blood Pressure: 123456 mmHg     Is BP treated: No     HDL Cholesterol: 74 mg/dL     Total Cholesterol: 235 mg/dL   HYPOTHYROIDISM Remains subclinical at baseline, no current medications. January 6.710.  She reports at one time she was written prescription for it, but did not take.   Thyroid control status:stable Fatigue: no Cold intolerance: no Heat intolerance: no Weight gain: no Weight loss: no Constipation: no Diarrhea/loose stools: no Palpitations: no Lower extremity edema: no Anxiety/depressed mood: no   OSTEOPOROSIS: Continues on daily Vitamin D and calcium.  Last DEXA on chart is 2016.  No recent falls or a fractures.  ANXIETY/STRESS Has been on Xanax at bedtime for long period. She tried Sertraline, but reports this was not successful.  Wishes not to trial other medication at this time, would prefer to try herbal remedies. Discussed option of Trazodone for sleep.  Pt isaware of risks ofbenzomedication use to include increased sedation, respiratory suppression, falls, dependence and cardiovascular events. Ptwould like to continue treatment as benefit. Currently she is taking 1/2 tablet Xanax during day and 1/2 at night. This works for her per her report and helps her mood. Again reviewed BEERS criteria with her and she is thinking about trying to slowly decrease use of Xanax on her own, discussed to try this slowly. Duration:stable Anxious mood: no  Excessive worrying: no Irritability: no  Sweating: no Nausea: no Palpitations:no Hyperventilation: no Panic attacks: no Agoraphobia: no  Obscessions/compulsions: no Depressed mood: no Depression screen [facility] 2/9 10/08/2019 06/25/2019 03/26/2019 03/26/2019 10/23/2018  Decreased Interest 2 1 1 1 1   Down, Depressed, Hopeless 0 0 1 1 0  PHQ - 2 Score 2 1 2 2 1   Altered sleeping 0 0 0 0 1  Tired, decreased energy 2 1 1 1 1   Change in appetite 0 1 1 1  0  Feeling bad or failure about yourself  0 0 0 0 0  Trouble concentrating 2 0 1 1 1   Moving slowly or fidgety/restless 0 0 0 0 0  Suicidal thoughts 0 0 0 0 0  PHQ-9 Score 6 3 5 5 4   Difficult doing work/chores - Not difficult at all Somewhat difficult - Not difficult at all  Some recent  data might be hidden   Anhedonia: no Weight changes: no Insomnia: yes hard to fall asleep  Hypersomnia: no Fatigue/loss of energy: no Feelings of worthlessness: no Feelings of guilt: no Impaired concentration/indecisiveness: no Suicidal ideations: no  Crying spells: no Recent Stressors/Life Changes: no   Relationship problems: no   Family stress: no     Financial stress: no    Job stress: no    Recent death/loss: no  Relevant past medical, surgical, family and social history reviewed and updated as indicated. Interim medical history  since our last visit reviewed. Allergies and medications reviewed and updated.  Review of Systems  Constitutional: Negative for activity change, appetite change, diaphoresis, fatigue and fever.  Respiratory: Negative for cough, chest tightness and shortness of breath.   Cardiovascular: Negative for chest pain, palpitations and leg swelling.  Gastrointestinal: Negative for abdominal distention, abdominal pain, constipation, diarrhea, nausea and vomiting.  Neurological: Negative for dizziness, syncope, weakness, light-headedness, numbness and headaches.  Psychiatric/Behavioral: Negative.     Per HPI unless specifically indicated above     Objective:    BP (!) 144/71 (BP Location: Left Arm, Patient Position: Sitting, Cuff Size: Normal)   Pulse 64   Temp (!) 97.5 F (36.4 C) (Oral)   SpO2 98%   Wt Readings from Last 3 Encounters:  03/26/19 99 lb 3.2 oz (45 kg)  12/15/18 99 lb (44.9 kg)  10/23/18 99 lb (44.9 kg)    Physical Exam Vitals and nursing note reviewed.  Constitutional:      General: She is awake. She is not in acute distress.    Appearance: She is well-developed. She is not ill-appearing.  HENT:     Head: Normocephalic.     Right Ear: Hearing normal.     Left Ear: Hearing normal.  Eyes:     General: Lids are normal.        Right eye: No discharge.        Left eye: No discharge.     Conjunctiva/sclera: Conjunctivae normal.     Pupils: Pupils are equal, round, and reactive to light.  Neck:     Thyroid: No thyromegaly.     Vascular: No carotid bruit.  Cardiovascular:     Rate and Rhythm: Normal rate and regular rhythm.     Heart sounds: Normal heart sounds. No murmur. No gallop.   Pulmonary:     Effort: Pulmonary effort is normal. No accessory muscle usage or respiratory distress.     Breath sounds: Normal breath sounds.  Abdominal:     General: Bowel sounds are normal.     Palpations: Abdomen is soft.  Musculoskeletal:     Cervical back: Normal range of  motion and neck supple.     Right lower leg: No edema.     Left lower leg: No edema.  Skin:    General: Skin is warm and dry.  Neurological:     Mental Status: She is alert and oriented to person, place, and time.  Psychiatric:        Attention and Perception: Attention normal.        Mood and Affect: Mood normal.        Behavior: Behavior normal. Behavior is cooperative.        Thought Content: Thought content normal.        Judgment: Judgment normal.     Results for orders placed or performed in visit on 07/19/19  HM MAMMOGRAPHY  Result Value Ref Range   HM Mammogram  0-4 Bi-Rad 0-4 Bi-Rad, Self Reported Normal      Assessment & Plan:   Problem List Items Addressed This Visit      Cardiovascular and Mediastinum   Hypertension - Primary    Chronic, stable.  BP slightly elevated today, but at home below goal; suspect more related to white coat syndrome.  Continue current diet control.  Consider Lisinopril if elevation in BP >140/90 consistently at home and office.  Labs today.        Endocrine   Hypothyroidism    Subclinical at baseline.  Labs today, does not wish to start medication at this time, but will consider dependent on labs.      Relevant Orders   Thyroid Panel With TSH     Musculoskeletal and Integument   Osteoporosis    Refuses repeat DEXA, discussed at length, or medication for osteoporosis.  Continue Vit D and calcium at home.  Check Vit D today.        Other   Hypercholesteremia    Chronic, refuses statin use.  Obtain lipid panel today and continue to recommend diet focus.        Relevant Orders   Comprehensive metabolic panel   Lipid Panel w/o Chol/HDL Ratio out   Vitamin D deficiency    Recheck Vit d level today.      Relevant Orders   Vit D  25 hydroxy (rtn osteoporosis monitoring)   Chronic anxiety    Chronic, ongoing for years and stable.  Denies SI/HI.  Has been on Xanax for several years.  Refills sent today #30 x 2.  Return in 3  months.  UDS today.  Has contract on file.      Relevant Orders   Urine drugs of abuse scrn w alc, routine (Ref Lab)   Controlled substance agreement signed    Signed in October 2020.      Long-term current use of benzodiazepine    Check UDS today and continued ongoing discussions about risks with long term benzo use.  She refuses to try alternate medications.      Relevant Orders   Urine drugs of abuse scrn w alc, routine (Ref Lab)       Follow up plan: Return in about 3 months (around 01/06/2020) for Anxiety, Osteoporosis, Hypothyroid.

## 2019-10-08 NOTE — Assessment & Plan Note (Signed)
Signed in October 2020.

## 2019-10-08 NOTE — Assessment & Plan Note (Signed)
Refuses repeat DEXA, discussed at length, or medication for osteoporosis.  Continue Vit D and calcium at home.  Check Vit D today.

## 2019-10-08 NOTE — Assessment & Plan Note (Signed)
Recheck Vit d level today. 

## 2019-10-08 NOTE — Assessment & Plan Note (Signed)
Chronic, ongoing for years and stable.  Denies SI/HI.  Has been on Xanax for several years.  Refills sent today #30 x 2.  Return in 3 months.  UDS today.  Has contract on file.

## 2019-10-08 NOTE — Assessment & Plan Note (Signed)
Chronic, stable.  BP slightly elevated today, but at home below goal; suspect more related to white coat syndrome.  Continue current diet control.  Consider Lisinopril if elevation in BP >140/90 consistently at home and office.  Labs today.

## 2019-10-09 LAB — URINE DRUGS OF ABUSE SCREEN W ALC, ROUTINE (REF LAB)
Amphetamines, Urine: NEGATIVE ng/mL
Barbiturate Quant, Ur: NEGATIVE ng/mL
Benzodiazepine Quant, Ur: NEGATIVE ng/mL
Cannabinoid Quant, Ur: NEGATIVE ng/mL
Cocaine (Metab.): NEGATIVE ng/mL
Ethanol, Urine: NEGATIVE %
Methadone Screen, Urine: NEGATIVE ng/mL
Opiate Quant, Ur: NEGATIVE ng/mL
PCP Quant, Ur: NEGATIVE ng/mL
Propoxyphene: NEGATIVE ng/mL

## 2019-10-09 LAB — LIPID PANEL W/O CHOL/HDL RATIO
Cholesterol, Total: 240 mg/dL — ABNORMAL HIGH (ref 100–199)
HDL: 80 mg/dL (ref >39)
LDL Chol Calc (NIH): 141 mg/dL — ABNORMAL HIGH (ref 0–99)
Triglycerides: 110 mg/dL (ref 0–149)
VLDL Cholesterol Cal: 19 mg/dL (ref 5–40)

## 2019-10-09 LAB — COMPREHENSIVE METABOLIC PANEL
ALT: 12 IU/L (ref 0–32)
AST: 19 IU/L (ref 0–40)
Albumin/Globulin Ratio: 1.9 (ref 1.2–2.2)
Albumin: 4.3 g/dL (ref 3.8–4.8)
Alkaline Phosphatase: 95 IU/L (ref 39–117)
BUN/Creatinine Ratio: 16 (ref 12–28)
BUN: 11 mg/dL (ref 8–27)
Bilirubin Total: 0.5 mg/dL (ref 0.0–1.2)
CO2: 24 mmol/L (ref 20–29)
Calcium: 9.5 mg/dL (ref 8.7–10.3)
Chloride: 101 mmol/L (ref 96–106)
Creatinine, Ser: 0.69 mg/dL (ref 0.57–1.00)
GFR calc Af Amer: 102 mL/min/{1.73_m2} (ref >59)
GFR calc non Af Amer: 88 mL/min/{1.73_m2} (ref >59)
Globulin, Total: 2.3 g/dL (ref 1.5–4.5)
Glucose: 87 mg/dL (ref 65–99)
Potassium: 4.8 mmol/L (ref 3.5–5.2)
Sodium: 140 mmol/L (ref 134–144)
Total Protein: 6.6 g/dL (ref 6.0–8.5)

## 2019-10-09 LAB — THYROID PANEL WITH TSH
Free Thyroxine Index: 1.8 (ref 1.2–4.9)
T3 Uptake Ratio: 25 % (ref 24–39)
T4, Total: 7.2 ug/dL (ref 4.5–12.0)
TSH: 4.82 u[IU]/mL — ABNORMAL HIGH (ref 0.450–4.500)

## 2019-10-09 LAB — VITAMIN D 25 HYDROXY (VIT D DEFICIENCY, FRACTURES): Vit D, 25-Hydroxy: 44 ng/mL (ref 30.0–100.0)

## 2019-10-09 NOTE — Progress Notes (Signed)
Please let Brandy Chen know her labs have returned: - Cholesterol levels remain elevated, I know she does not wish to start medication.  Recommend continued focus on diet. - Thyroid, TSH, level remains slightly elevated but trended down from previous visits.  Will continue to monitor, but no medication at this time. - Kidney and liver function are normal. - Vitamin D level in normal range. Have a great day!!

## 2019-10-24 ENCOUNTER — Other Ambulatory Visit: Payer: Self-pay | Admitting: Nurse Practitioner

## 2019-10-25 ENCOUNTER — Other Ambulatory Visit: Payer: Self-pay | Admitting: Nurse Practitioner

## 2019-10-25 MED ORDER — ALPRAZOLAM 0.5 MG PO TABS: 0.5000 mg | ORAL_TABLET | Freq: Every day | ORAL | 2 refills | Status: DC

## 2019-10-25 NOTE — Telephone Encounter (Signed)
Routing to provider  

## 2019-10-25 NOTE — Telephone Encounter (Signed)
Rx refill request for Alprazolam 0.5MG  Tablet.  LOV: 10/08/2019 with Jolene Cannady. No future OV scheduled.

## 2019-11-02 ENCOUNTER — Other Ambulatory Visit: Payer: Self-pay

## 2019-11-02 ENCOUNTER — Ambulatory Visit: Payer: Medicare HMO | Attending: Internal Medicine

## 2019-11-02 DIAGNOSIS — Z23 Encounter for immunization: Secondary | ICD-10-CM | POA: Insufficient documentation

## 2019-11-02 NOTE — Progress Notes (Signed)
   Covid-19 Vaccination Clinic  Name:  Brandy Chen    MRN: [id number] DOB: 13-Jan-1951  11/02/2019  [name] was observed post Covid-19 immunization for 15 minutes without incidence. She was provided with Vaccine Information Sheet and instruction to access the V-Safe system.   [name] was instructed to call 911 with any severe reactions post vaccine: Marland Kitchen Difficulty breathing  . Swelling of your face and throat  . A fast heartbeat  . A bad rash all over your body  . Dizziness and weakness    Immunizations Administered    Name Date Dose VIS Date Route   Moderna COVID-19 Vaccine 11/02/2019  2:48 PM 0.5 mL 08/28/2019 Intramuscular   Manufacturer: Moderna   Lot: [id number]   [id number]

## 2019-12-04 ENCOUNTER — Ambulatory Visit: Payer: Medicare HMO | Attending: Internal Medicine

## 2019-12-04 DIAGNOSIS — Z23 Encounter for immunization: Secondary | ICD-10-CM

## 2019-12-04 NOTE — Progress Notes (Signed)
   Covid-19 Vaccination Clinic  Name:  Brandy Chen    MRN: [id number] DOB: [birth year]  12/04/2019  Brandy Chen was observed post Covid-19 immunization for 15 minutes without incident. She was provided with Vaccine Information Sheet and instruction to access the V-Safe system.   Brandy Chen was instructed to call 911 with any severe reactions post vaccine: Marland Kitchen Difficulty breathing  . Swelling of face and throat  . A fast heartbeat  . A bad rash all over body  . Dizziness and weakness   Immunizations Administered    Name Date Dose VIS Date Route   Moderna COVID-19 Vaccine 12/04/2019  2:53 PM 0.5 mL 08/28/2019 Intramuscular   Manufacturer: Moderna   Lot: [id number]   [id number]

## 2020-01-27 ENCOUNTER — Other Ambulatory Visit: Payer: Self-pay | Admitting: Nurse Practitioner

## 2020-01-27 NOTE — Telephone Encounter (Signed)
Requested medication (s) are due for refill today: yes  Requested medication (s) are on the active medication list: yes  Last refill:  10/25/19  Future visit scheduled: no  Notes to clinic:  med not delegated to NT to refill   Requested Prescriptions  Pending Prescriptions Disp Refills   ALPRAZolam (XANAX) 0.5 MG tablet [Pharmacy Med Name: ALPRAZOLAM 0.5 MG TABLET] 30 tablet 2    Sig: TAKE 1 TABLET BY MOUTH AT BEDTIME.      Not Delegated - Psychiatry:  Anxiolytics/Hypnotics Failed - 01/27/2020 11:40 AM      Failed - This refill cannot be delegated      Failed - Urine Drug Screen completed in last 360 days.      Passed - Valid encounter within last 6 months    Recent Outpatient Visits           3 months ago Essential hypertension   [place], Barbaraann Faster, NP   7 months ago Chronic anxiety   [place], NP   10 months ago Chronic anxiety   [place], [place] T, NP   1 year ago Dizziness   [place], Barbaraann Faster, NP   1 year ago Chronic anxiety   [place], Barbaraann Faster, NP

## 2020-01-28 NOTE — Telephone Encounter (Signed)
Pt has an appt on Monday 5-10

## 2020-01-28 NOTE — Telephone Encounter (Signed)
Lvm to make this apt. 

## 2020-01-28 NOTE — Telephone Encounter (Signed)
Last fill on 12/27/2019.  Has upcoming appointment with provider.

## 2020-01-28 NOTE — Telephone Encounter (Signed)
Called pt advised of refill and appt. Pt verbalized understanding.

## 2020-01-28 NOTE — Telephone Encounter (Signed)
Needs follow-up, was due in April.  Once scheduled will provide enough supply to last until appointment.

## 2020-02-04 ENCOUNTER — Ambulatory Visit: Payer: Medicare HMO | Admitting: Nurse Practitioner

## 2020-03-05 ENCOUNTER — Ambulatory Visit (INDEPENDENT_AMBULATORY_CARE_PROVIDER_SITE_OTHER): Payer: Medicare HMO

## 2020-03-05 VITALS — BP 117/73 | HR 74 | Ht 59.0 in | Wt 100.0 lb

## 2020-03-05 DIAGNOSIS — Z0000 Encounter for general adult medical examination without abnormal findings: Secondary | ICD-10-CM

## 2020-03-05 NOTE — Patient Instructions (Signed)
Brandy Chen , Thank you for taking time to come for your Medicare Wellness Visit. I appreciate your ongoing commitment to your health goals. Please review the following plan we discussed and let me know if I can assist you in the future.   Screening recommendations/referrals: Colonoscopy: completed fecal occult through [facility] 04/2019, due 04/2020. Please bring results upon receiving.  Mammogram: completed 07/12/2019, due 06/2020 Bone Density: completed 2016 Recommended yearly ophthalmology/optometry visit for glaucoma screening and checkup Recommended yearly dental visit for hygiene and checkup  Vaccinations: Influenza vaccine: due 05/2020 Pneumococcal vaccine: pneumoocal 23 due Tdap vaccine: due, check with insurance for coverage information Shingles vaccine: shingrix eligible    Covid-19:completed series   Advanced directives: Advance directive discussed with you today. I have provided a copy for you to complete at home and have notarized. Once this is complete please bring a copy in to our office so we can scan it into your chart.  Conditions/risks identified: continue exercising at least 3 times a week for 30 minutes.   Next appointment: Follow up in one year for your annual wellness visit 03/09/2021 at 1030am   Preventive Care 65 Years and Older, Female Preventive care refers to lifestyle choices and visits with your health care provider that can promote health and wellness. What does preventive care include?  A yearly physical exam. This is also called an annual well check.  Dental exams once or twice a year.  Routine eye exams. Ask your health care provider how often you should have your eyes checked.  Personal lifestyle choices, including:  Daily care of your teeth and gums.  Regular physical activity.  Eating a healthy diet.  Avoiding tobacco and drug use.  Limiting alcohol use.  Practicing safe sex.  Taking low-dose aspirin every day.  Taking vitamin and mineral  supplements as recommended by your health care provider. What happens during an annual well check? The services and screenings done by your health care provider during your annual well check will depend on your age, overall health, lifestyle risk factors, and family history of disease. Counseling  Your health care provider may ask you questions about your:  Alcohol use.  Tobacco use.  Drug use.  Emotional well-being.  Home and relationship well-being.  Sexual activity.  Eating habits.  History of falls.  Memory and ability to understand (cognition).  Work and work Statistician.  Reproductive health. Screening  You may have the following tests or measurements:  Height, weight, and BMI.  Blood pressure.  Lipid and cholesterol levels. These may be checked every 5 years, or more frequently if you are over 55 years old.  Skin check.  Lung cancer screening. You may have this screening every year starting at age 10 if you have a 30-pack-year history of smoking and currently smoke or have quit within the past 15 years.  Fecal occult blood test (FOBT) of the stool. You may have this test every year starting at age 59.  Flexible sigmoidoscopy or colonoscopy. You may have a sigmoidoscopy every 5 years or a colonoscopy every 10 years starting at age 46.  Hepatitis C blood test.  Hepatitis B blood test.  Sexually transmitted disease (STD) testing.  Diabetes screening. This is done by checking your blood sugar (glucose) after you have not eaten for a while (fasting). You may have this done every 1-3 years.  Bone density scan. This is done to screen for osteoporosis. You may have this done starting at age 17.  Mammogram. This may be  done every 1-2 years. Talk to your health care provider about how often you should have regular mammograms. Talk with your health care provider about your test results, treatment options, and if necessary, the need for more tests. Vaccines  Your  health care provider may recommend certain vaccines, such as:  Influenza vaccine. This is recommended every year.  Tetanus, diphtheria, and acellular pertussis (Tdap, Td) vaccine. You may need a Td booster every 10 years.  Zoster vaccine. You may need this after age 10.  Pneumococcal 13-valent conjugate (PCV13) vaccine. One dose is recommended after age 64.  Pneumococcal polysaccharide (PPSV23) vaccine. One dose is recommended after age 72. Talk to your health care provider about which screenings and vaccines you need and how often you need them. This information is not intended to replace advice given to you by your health care provider. Make sure you discuss any questions you have with your health care provider. Document Released: 10/10/2015 Document Revised: 06/02/2016 Document Reviewed: 07/15/2015 Elsevier Interactive Patient Education  2017 [place] Prevention in the Home Falls can cause injuries. They can happen to people of all ages. There are many things you can do to make your home safe and to help prevent falls. What can I do on the outside of my home?  Regularly fix the edges of walkways and driveways and fix any cracks.  Remove anything that might make you trip as you walk through a door, such as a raised step or threshold.  Trim any bushes or trees on the path to your home.  Use bright outdoor lighting.  Clear any walking paths of anything that might make someone trip, such as rocks or tools.  Regularly check to see if handrails are loose or broken. Make sure that both sides of any steps have handrails.  Any raised decks and porches should have guardrails on the edges.  Have any leaves, snow, or ice cleared regularly.  Use sand or salt on walking paths during winter.  Clean up any spills in your garage right away. This includes oil or grease spills. What can I do in the bathroom?  Use night lights.  Install grab bars by the toilet and in the tub and  shower. Do not use towel bars as grab bars.  Use non-skid mats or decals in the tub or shower.  If you need to sit down in the shower, use a plastic, non-slip stool.  Keep the floor dry. Clean up any water that spills on the floor as soon as it happens.  Remove soap buildup in the tub or shower regularly.  Attach bath mats securely with double-sided non-slip rug tape.  Do not have throw rugs and other things on the floor that can make you trip. What can I do in the bedroom?  Use night lights.  Make sure that you have a light by your bed that is easy to reach.  Do not use any sheets or blankets that are too big for your bed. They should not hang down onto the floor.  Have a firm chair that has side arms. You can use this for support while you get dressed.  Do not have throw rugs and other things on the floor that can make you trip. What can I do in the kitchen?  Clean up any spills right away.  Avoid walking on wet floors.  Keep items that you use a lot in easy-to-reach places.  If you need to reach something above you, use  a strong step stool that has a grab bar.  Keep electrical cords out of the way.  Do not use floor polish or wax that makes floors slippery. If you must use wax, use non-skid floor wax.  Do not have throw rugs and other things on the floor that can make you trip. What can I do with my stairs?  Do not leave any items on the stairs.  Make sure that there are handrails on both sides of the stairs and use them. Fix handrails that are broken or loose. Make sure that handrails are as long as the stairways.  Check any carpeting to make sure that it is firmly attached to the stairs. Fix any carpet that is loose or worn.  Avoid having throw rugs at the top or bottom of the stairs. If you do have throw rugs, attach them to the floor with carpet tape.  Make sure that you have a light switch at the top of the stairs and the bottom of the stairs. If you do not  have them, ask someone to add them for you. What else can I do to help prevent falls?  Wear shoes that:  Do not have high heels.  Have rubber bottoms.  Are comfortable and fit you well.  Are closed at the toe. Do not wear sandals.  If you use a stepladder:  Make sure that it is fully opened. Do not climb a closed stepladder.  Make sure that both sides of the stepladder are locked into place.  Ask someone to hold it for you, if possible.  Clearly mark and make sure that you can see:  Any grab bars or handrails.  First and last steps.  Where the edge of each step is.  Use tools that help you move around (mobility aids) if they are needed. These include:  Canes.  Walkers.  Scooters.  Crutches.  Turn on the lights when you go into a dark area. Replace any light bulbs as soon as they burn out.  Set up your furniture so you have a clear path. Avoid moving your furniture around.  If any of your floors are uneven, fix them.  If there are any pets around you, be aware of where they are.  Review your medicines with your doctor. Some medicines can make you feel dizzy. This can increase your chance of falling. Ask your doctor what other things that you can do to help prevent falls. This information is not intended to replace advice given to you by your health care provider. Make sure you discuss any questions you have with your health care provider. Document Released: 07/10/2009 Document Revised: 02/19/2016 Document Reviewed: 10/18/2014 Elsevier Interactive Patient Education  2017 [employer].

## 2020-03-05 NOTE — Progress Notes (Signed)
Subjective:   Brandy Chen is a 69 y.o. female who presents for Medicare Annual (Subsequent) preventive examination.  I connected with Albina Billet today by telephone and verified that I am speaking with the correct person using two identifiers. Location patient: home Location provider: work Persons participating in the virtual visit: patient, Marine scientist.    I discussed the limitations, risks, security and privacy concerns of performing an evaluation and management service by telephone and the availability of in person appointments. I also discussed with the patient that there may be a patient responsible charge related to this service. The patient expressed understanding and verbally consented to this telephonic visit.    Interactive audio and video telecommunications were attempted between this provider and patient, however failed, due to patient having technical difficulties OR patient did not have access to video capability.  We continued and completed visit with audio only.  Some vital signs may be absent or patient reported.   Time Spent with patient on telephone encounter: 20 minutes  Review of Systems:  Cardiac Risk Factors include: advanced age (>73men, >68 women);dyslipidemia;hypertension     Objective:     Vitals: BP 117/73   Pulse 74   Ht 4\' 11"  (1.499 m)   Wt 100 lb (45.4 kg)   BMI 20.20 kg/m   Body mass index is 20.2 kg/m.  Advanced Directives 03/05/2020 03/26/2019 03/20/2018 02/17/2017 02/16/2016  Does Patient Have a Medical Advance Directive? No No No No No  Does patient want to make changes to medical advance directive? Yes (MAU/Ambulatory/Procedural Areas - Information given) - - - -  Would patient like information on creating a medical advance directive? - - Yes (MAU/Ambulatory/Procedural Areas - Information given) Yes (MAU/Ambulatory/Procedural Areas - Information given) -    Tobacco Social History   Tobacco Use  Smoking Status Never Smoker  Smokeless Tobacco  Never Used     Counseling given: Not Answered   Clinical Intake:  Pre-visit preparation completed: Yes  Pain : No/denies pain     Nutritional Status: BMI of 19-24  Normal Nutritional Risks: None Diabetes: No  How often do you need to have someone help you when you read instructions, pamphlets, or other written materials from your doctor or pharmacy?: 1 - Never  Interpreter Needed?: No  Information entered by :: Candela Krul,LPN  Past Medical History:  Diagnosis Date  . Anxiety   . Depression   . Endometriosis   . Fatigue   . Hyperlipidemia   . Hypertension   . Insomnia   . Lumbago   . Osteoporosis   . Thyroid disease   . Vitamin D deficiency    Past Surgical History:  Procedure Laterality Date  . CESAREAN SECTION    . pyloric stenosis    . TUBAL LIGATION     Family History  Problem Relation Age of Onset  . Stroke Mother   . Hypertension Mother   . Stroke Maternal Grandfather   . Hypertension Sister    Social History   Socioeconomic History  . Marital status: Married    Spouse name: Not on file  . Number of children: Not on file  . Years of education: Not on file  . Highest education level: High school graduate  Occupational History  . Not on file  Tobacco Use  . Smoking status: Never Smoker  . Smokeless tobacco: Never Used  Substance and Sexual Activity  . Alcohol use: No    Alcohol/week: 0.0 standard drinks  . Drug use: No  .  Sexual activity: Not Currently  Other Topics Concern  . Not on file  Social History Narrative  . Not on file   Social Determinants of Health   Financial Resource Strain:   . Difficulty of Paying Living Expenses:   Food Insecurity:   . Worried About Charity fundraiser in the Last Year:   . Arboriculturist in the Last Year:   Transportation Needs:   . Film/video editor (Medical):   Marland Kitchen Lack of Transportation (Non-Medical):   Physical Activity: Unknown  . Days of Exercise per Week: Not on file  . Minutes of  Exercise per Session: 30 min  Stress:   . Feeling of Stress :   Social Connections:   . Frequency of Communication with Friends and Family:   . Frequency of Social Gatherings with Friends and Family:   . Attends Religious Services:   . Active Member of Clubs or Organizations:   . Attends Archivist Meetings:   Marland Kitchen Marital Status:     Outpatient Encounter Medications as of 03/05/2020  Medication Sig  . ALPRAZolam (XANAX) 0.5 MG tablet TAKE 1 TABLET BY MOUTH AT BEDTIME.  Marland Kitchen aspirin EC 81 MG tablet Take 81 mg by mouth.  . Calcium Carb-Cholecalciferol (CALCIUM 600+D) 600-800 MG-UNIT TABS Take 1 tablet by mouth 2 (two) times daily.  Javier Docker Oil 300 MG CAPS Take 300 mg by mouth daily.  . Magnesium 500 MG TABS Take 500 mg by mouth daily.  . meclizine (ANTIVERT) 25 MG tablet Take 1 tablet (25 mg total) by mouth 3 (three) times daily as needed for dizziness.  . vitamin C (ASCORBIC ACID) 500 MG tablet Take 500 mg by mouth daily.  . Nutritional Supplements (ESTROVEN PO) Take 1 tablet by mouth daily.   No facility-administered encounter medications on file as of 03/05/2020.    Activities of Daily Living In your present state of health, do you have any difficulty performing the following activities: 03/05/2020 03/26/2019  Hearing? N N  Comment no hearing aids -  Vision? N N  Comment reading glasses, Dr.Woodard -  Difficulty concentrating or making decisions? N N  Walking or climbing stairs? N N  Dressing or bathing? N N  Doing errands, shopping? N N  Preparing Food and eating ? N N  Using the Toilet? N N  In the past six months, have you accidently leaked urine? N N  Do you have problems with loss of bowel control? N N  Managing your Medications? N N  Managing your Finances? N N  Housekeeping or managing your Housekeeping? N N  Some recent data might be hidden    Patient Care Team: Venita Lick, NP as PCP - General (Nurse Practitioner)    Assessment:   This is a routine  wellness examination for [place].  Exercise Activities and Dietary recommendations Current Exercise Habits: Home exercise routine, Type of exercise: walking;stretching, Time (Minutes): 40, Frequency (Times/Week): 7, Weekly Exercise (Minutes/Week): 280, Intensity: Mild, Exercise limited by: None identified  Goals Addressed   None     Fall Risk: Fall Risk  03/05/2020 10/08/2019 03/26/2019 12/15/2018 03/20/2018  Falls in the past year? 0 1 1 1  No  Number falls in past yr: 0 0 0 0 -  Injury with Fall? 0 1 1 1  -    FALL RISK PREVENTION PERTAINING TO THE HOME:  Any stairs in or around the home? Yes  steps going in home  If so, are there any without  handrails? No   Home free of loose throw rugs in walkways, pet beds, electrical cords, etc? Yes  Adequate lighting in your home to reduce risk of falls? Yes   ASSISTIVE DEVICES UTILIZED TO PREVENT FALLS:  Life alert? No  Use of a cane, walker or w/c? No  Grab bars in the bathroom? No  Shower chair or bench in shower? No  Elevated toilet seat or a handicapped toilet? No   DME ORDERS:  DME order needed?  No   TIMED UP AND GO:  Unable to perform    Depression Screen PHQ 2/9 Scores 03/05/2020 10/08/2019 06/25/2019 03/26/2019  PHQ - 2 Score 1 2 1 2   PHQ- 9 Score - 6 3 5      Cognitive Function     6CIT Screen 03/26/2019 03/20/2018 02/17/2017  What Year? 0 points 0 points 0 points  What month? 0 points 0 points 0 points  What time? 0 points 0 points 0 points  Count back from 20 0 points 0 points 0 points  Months in reverse 0 points 0 points 0 points  Repeat phrase 0 points 0 points 0 points  Total Score 0 0 0    Immunization History  Administered Date(s) Administered  . Fluad Quad(high Dose 65+) 06/25/2019  . Influenza, High Dose Seasonal PF 08/23/2016, 08/04/2017, 07/17/2018  . Influenza-Unspecified 06/27/2015  . Moderna SARS-COVID-2 Vaccination 11/02/2019, 12/04/2019  . Pneumococcal Conjugate-13 02/16/2016    Qualifies for  Shingles Vaccine? Yes  Zostavax completed n/a. Due for Shingrix. Education has been provided regarding the importance of this vaccine. Pt has been advised to call insurance company to determine out of pocket expense. Advised may also receive vaccine at local pharmacy or Health Dept. Verbalized acceptance and understanding.  Tdap: Discussed need for TD/TDAP vaccine, patient verbalized understanding that this is not covered as a preventative with there insurance and to call the office if she develops any new skin injuries, ie: cuts, scrapes, bug bites, or open wounds.  Flu Vaccine: up to date   Pneumococcal Vaccine: Declined booster dose.  Covid-19 Vaccine: Completed vaccines  Screening Tests Health Maintenance  Topic Date Due  . TETANUS/TDAP  06/24/2020 (Originally 12/17/1969)  . PNA vac Low Risk Adult (2 of 2 - PPSV23) 10/07/2020 (Originally 02/15/2017)  . INFLUENZA VACCINE  04/27/2020  . COLON CANCER SCREENING ANNUAL FOBT  05/16/2020  . MAMMOGRAM  07/11/2021  . Fecal DNA (Cologuard)  05/16/2022  . DEXA SCAN  Completed  . COVID-19 Vaccine  Completed  . Hepatitis C Screening  Completed    Cancer Screenings:  Colorectal Screening: Completed fecal occult completed 05/17/2019.  Repeat every year.   Mammogram: Completed 07/12/2019. Repeat every year  Bone Density: Completed 2016   Lung Cancer Screening: (Low Dose CT Chest recommended if Age 79-80 years, 30 pack-year currently smoking OR have quit w/in 15years.) does not qualify.     Additional Screening:  Hepatitis C Screening: does qualify; Completed  Vision Screening: Recommended annual ophthalmology exams for early detection of glaucoma and other disorders of the eye. Is the patient up to date with their annual eye exam?  Yes  Who is the provider or what is the name of the office in which the pt attends annual eye exams? Dr.Woodard   Dental Screening: Recommended annual dental exams for proper oral hygiene  Community Resource  Referral:  CRR required this visit?  No       Plan:  I have personally reviewed and addressed the Medicare Annual Wellness questionnaire  and have noted the following in the patient's chart:  A. Medical and social history B. Use of alcohol, tobacco or illicit drugs  C. Current medications and supplements D. Functional ability and status E.  Nutritional status F.  Physical activity G. Advance directives H. List of other physicians I.  Hospitalizations, surgeries, and ER visits in previous 12 months J.  [place] such as hearing and vision if needed, cognitive and depression L. Referrals and appointments   In addition, I have reviewed and discussed with patient certain preventive protocols, quality metrics, and best practice recommendations. A written personalized care plan for preventive services as well as general preventive health recommendations were provided to patient.  Signed,    Bevelyn Ngo, LPN  1/0/4045 Nurse Health Advisor   Nurse Notes: none

## 2020-03-27 ENCOUNTER — Telehealth: Payer: Self-pay | Admitting: Nurse Practitioner

## 2020-03-27 NOTE — Telephone Encounter (Signed)
Copied from [place] [phone]. Topic: General - Inquiry >> Mar 27, 2020 11:01 AM Brandy Chen wrote: Reason for CRM: pt called in stated that she has had off and on issue with her right ear and She stated she has spoke with Brandy Chen about it and would like to know if she could call in some drop to help her.  She stated it has flared up again .  Would she need an appointment for this since she said she has already spoke to Brandy Chen about this? Best number -[phone] Pharmacy - [facility] in [place]

## 2020-03-27 NOTE — Telephone Encounter (Signed)
Please see if we can get her in office tomorrow vs her scheduled visit on 19th, so I can look at her ear and see if need for treatment -- can do her chronic disease visit then too.  Thank you.

## 2020-03-27 NOTE — Telephone Encounter (Signed)
Called patient, no answer, left a message asking her to return my call so that we can see about getting her scheduled for an appointment for tomorrow.

## 2020-03-27 NOTE — Telephone Encounter (Signed)
Patient notified that pcp is out of office today and that she will contact her tomorrow.

## 2020-03-28 NOTE — Telephone Encounter (Signed)
Called and left a message for patient asking her to call and schedule an office visit.

## 2020-04-01 NOTE — Telephone Encounter (Signed)
Unable to reach patient, will close this encounter.

## 2020-04-08 ENCOUNTER — Ambulatory Visit (INDEPENDENT_AMBULATORY_CARE_PROVIDER_SITE_OTHER): Payer: Medicare HMO | Admitting: Nurse Practitioner

## 2020-04-08 ENCOUNTER — Encounter: Payer: Self-pay | Admitting: Nurse Practitioner

## 2020-04-08 ENCOUNTER — Other Ambulatory Visit: Payer: Self-pay

## 2020-04-08 VITALS — BP 128/78 | HR 72 | Temp 98.0°F | Wt 95.8 lb

## 2020-04-08 DIAGNOSIS — I10 Essential (primary) hypertension: Secondary | ICD-10-CM | POA: Diagnosis not present

## 2020-04-08 DIAGNOSIS — F419 Anxiety disorder, unspecified: Secondary | ICD-10-CM | POA: Diagnosis not present

## 2020-04-08 DIAGNOSIS — Z79899 Other long term (current) drug therapy: Secondary | ICD-10-CM | POA: Diagnosis not present

## 2020-04-08 DIAGNOSIS — E7800 Pure hypercholesterolemia, unspecified: Secondary | ICD-10-CM

## 2020-04-08 DIAGNOSIS — E039 Hypothyroidism, unspecified: Secondary | ICD-10-CM | POA: Diagnosis not present

## 2020-04-08 MED ORDER — ALPRAZOLAM 0.5 MG PO TABS: 0.5000 mg | ORAL_TABLET | Freq: Every day | ORAL | 2 refills | Status: DC

## 2020-04-08 NOTE — Assessment & Plan Note (Signed)
Chronic, refuses statin use.  Obtain lipid panel next visit and continue to recommend diet focus.

## 2020-04-08 NOTE — Assessment & Plan Note (Signed)
Subclinical at baseline.  Labs next visit, does not wish to start medication at this time, but will consider dependent on labs.

## 2020-04-08 NOTE — Patient Instructions (Signed)
Preventing High Cholesterol Cholesterol is a white, waxy substance similar to fat that the human body needs to help build cells. The liver makes all the cholesterol that a person's body needs. Having high cholesterol (hypercholesterolemia) increases a person's risk for heart disease and stroke. Extra (excess) cholesterol comes from the food the person eats. High cholesterol can often be prevented with diet and lifestyle changes. If you already have high cholesterol, you can control it with diet and lifestyle changes and with medicine. How can high cholesterol affect me? If you have high cholesterol, deposits (plaques) may build up on the walls of your arteries. The arteries are the blood vessels that carry blood away from your heart. Plaques make the arteries narrower and stiffer. This can limit or block blood flow and cause blood clots to form. Blood clots:  Are tiny balls of cells that form in your blood.  Can move to the heart or brain, causing a heart attack or stroke. Plaques in arteries greatly increase your risk for heart attack and stroke.Making diet and lifestyle changes can reduce your risk for these conditions that may threaten your life. What can increase my risk? This condition is more likely to develop in people who:  Eat foods that are high in saturated fat or cholesterol. Saturated fat is mostly found in: ? Foods that contain animal fat, such as red meat and some dairy products. ? Certain fatty foods made from plants, such as tropical oils.  Are overweight.  Are not getting enough exercise.  Have a family history of high cholesterol. What actions can I take to prevent this? Nutrition   Eat less saturated fat.  Avoid trans fats (partially hydrogenated oils). These are often found in margarine and in some baked goods, fried foods, and snacks bought in packages.  Avoid precooked or cured meat, such as sausages or meat loaves.  Avoid foods and drinks that have added  sugars.  Eat more fruits, vegetables, and whole grains.  Choose healthy sources of protein, such as fish, poultry, lean cuts of red meat, beans, peas, lentils, and nuts.  Choose healthy sources of fat, such as: ? Nuts. ? Vegetable oils, especially olive oil. ? Fish that have healthy fats (omega-3 fatty acids), such as mackerel or salmon. The items listed above may not be a complete list of recommended foods and beverages. Contact a dietitian for more information. Lifestyle  Lose weight if you are overweight. Losing 5-10 lb (2.3-4.5 kg) can help prevent or control high cholesterol. It can also lower your risk for diabetes and high blood pressure. Ask your health care provider to help you with a diet and exercise plan to lose weight safely.  Do not use any products that contain nicotine or tobacco, such as cigarettes, e-cigarettes, and chewing tobacco. If you need help quitting, ask your health care provider.  Limit your alcohol intake. ? Do not drink alcohol if:  Your health care provider tells you not to drink.  You are pregnant, may be pregnant, or are planning to become pregnant. ? If you drink alcohol:  Limit how much you use to:  0-1 drink a day for women.  0-2 drinks a day for men.  Be aware of how much alcohol is in your drink. In the [place] one drink equals one 12 oz bottle of beer (355 mL), one 5 oz glass of wine (148 mL), or one 1 oz glass of hard liquor (44 mL). Activity   Get enough exercise. Each week, do at   least 150 minutes of exercise that takes a medium level of effort (moderate-intensity exercise). ? This is exercise that:  Makes your heart beat faster and makes you breathe harder than usual.  Allows you to still be able to talk. ? You could exercise in short sessions several times a day or longer sessions a few times a week. For example, on 5 days each week, you could walk fast or ride your bike 3 times a day for 10 minutes each time.  Do exercises as told  by your health care provider. Medicines  In addition to diet and lifestyle changes, your health care provider may recommend medicines to help lower cholesterol. This may be a medicine to lower the amount of cholesterol your liver makes. You may need medicine if: ? Diet and lifestyle changes do not lower your cholesterol enough. ? You have high cholesterol and other risk factors for heart disease or stroke.  Take over-the-counter and prescription medicines only as told by your health care provider. General information  Manage your risk factors for high cholesterol. Talk with your health care provider about all your risk factors and how to lower your risk.  Manage other conditions that you have, such as diabetes or high blood pressure (hypertension).  Have blood tests to check your cholesterol levels at regular points in time as told by your health care provider.  Keep all follow-up visits as told by your health care provider. This is important. Where to find more information  American Heart Association: www.heart.org  National Heart, Lung, and Blood Institute: www.nhlbi.nih.gov Summary  High cholesterol increases your risk for heart disease and stroke. By keeping your cholesterol level low, you can reduce your risk for these conditions.  High cholesterol can often be prevented with diet and lifestyle changes.  Work with your health care provider to manage your risk factors, and have your blood tested regularly. This information is not intended to replace advice given to you by your health care provider. Make sure you discuss any questions you have with your health care provider. Document Revised: 01/05/2019 Document Reviewed: 05/22/2016 Elsevier Patient Education  2020 Elsevier Inc.  

## 2020-04-08 NOTE — Assessment & Plan Note (Signed)
Check UDS today and continued ongoing discussions about risks with long term benzo use.  She refuses to try alternate medications. 

## 2020-04-08 NOTE — Assessment & Plan Note (Signed)
Chronic, ongoing for years and stable.  Denies SI/HI.  Has been on Xanax for several years.  Refills sent today #30 x 2.  Return in 3 months.  UDS up to date.  Has contract on file. °

## 2020-04-08 NOTE — Progress Notes (Signed)
BP 128/78 (BP Location: Left Arm)    Pulse 72    Temp 98 F (36.7 C) (Oral)    Wt 95 lb 12.8 oz (43.5 kg)    SpO2 96%    BMI 19.35 kg/m    Subjective:    Patient ID: Brandy Chen, female    DOB: 1951/07/12, 69 y.o.   MRN: [id number]  HPI: Brandy Chen is a 69 y.o. female  Chief Complaint  Patient presents with   Anxiety   HYPERTENSION / HYPERLIPIDEMIA No current medications, other than fish oil and ASA.  Refuses statin or BP medications. Satisfied with current treatment? yes Duration of hypertension: chronic BP monitoring frequency: daily BP range: 122/82 -- 120-130/70's on average at home BP medication side effects: no Duration of hyperlipidemia: chronic Cholesterol supplements: fish oil Medication compliance: good compliance Aspirin: yes Recent stressors: no Recurrent headaches: no Visual changes: no Palpitations: no Dyspnea: no Chest pain: no Lower extremity edema: no Dizzy/lightheaded: no The 10-year ASCVD risk score [name], et al., 2013) is: 10.5%   Values used to calculate the score:     Age: 89 years     Sex: Female     Is Non-Hispanic African American: No     Diabetic: No     Tobacco smoker: No     Systolic Blood Pressure: 096 mmHg     Is BP treated: No     HDL Cholesterol: 80 mg/dL     Total Cholesterol: 240 mg/dL   HYPOTHYROIDISM Remains subclinical at baseline, no current medications. January 4.820, T4 7.2.  She reports at one time she was written prescription for it, but did not take.   Thyroid control status:stable Fatigue: no Cold intolerance: no Heat intolerance: no Weight gain: no Weight loss: no Constipation: no Diarrhea/loose stools: no Palpitations: no Lower extremity edema: no Anxiety/depressed mood: no   ANXIETY/STRESS Has been on Xanax at bedtime for long period. She tried Sertraline, but reports this was not successful. Wishes not to trial other medication at this time, would prefer to try herbal remedies.  Pt  isaware of risks ofbenzomedication use to include increased sedation, respiratory suppression, falls, dependence and cardiovascular events. Ptwould like to continue treatment as benefit. Currently she is taking 1/2 tablet Xanax during day and 1/2 at night. This works for her per her report and helps her mood. Again reviewed BEERS criteria with her and she is thinking about trying to slowly decrease use of Xanax on her own, discussed to try this slowly. PDMP last fill 03/28/20. Duration:stable Anxious mood: no  Excessive worrying: no Irritability: no  Sweating: no Nausea: no Palpitations:no Hyperventilation: no Panic attacks: no Agoraphobia: no  Obscessions/compulsions: no Depressed mood: no Depression screen [facility] 2/9 04/08/2020 03/05/2020 10/08/2019 06/25/2019 03/26/2019  Decreased Interest 1 0 2 1 1   Down, Depressed, Hopeless 1 1 0 0 1  PHQ - 2 Score 2 1 2 1 2   Altered sleeping 0 - 0 0 0  Tired, decreased energy 1 - 2 1 1   Change in appetite 1 - 0 1 1  Feeling bad or failure about yourself  0 - 0 0 0  Trouble concentrating 0 - 2 0 1  Moving slowly or fidgety/restless 0 - 0 0 0  Suicidal thoughts 0 - 0 0 0  PHQ-9 Score 4 - 6 3 5   Difficult doing work/chores Not difficult at all - - Not difficult at all Somewhat difficult  Some recent data might be hidden  Anhedonia: no Weight changes: no Insomnia: yes hard to fall asleep  Hypersomnia: no Fatigue/loss of energy: no Feelings of worthlessness: no Feelings of guilt: no Impaired concentration/indecisiveness: no Suicidal ideations: no  Crying spells: no Recent Stressors/Life Changes: no   Relationship problems: no   Family stress: no     Financial stress: no    Job stress: no    Recent death/loss: no  Relevant past medical, surgical, family and social history reviewed and updated as indicated. Interim medical history since our last visit reviewed. Allergies and medications reviewed and updated.  Review of Systems    Constitutional: Negative for activity change, appetite change, diaphoresis, fatigue and fever.  Respiratory: Negative for cough, chest tightness and shortness of breath.   Cardiovascular: Negative for chest pain, palpitations and leg swelling.  Gastrointestinal: Negative for abdominal distention, abdominal pain, constipation, diarrhea, nausea and vomiting.  Neurological: Negative for dizziness, syncope, weakness, light-headedness, numbness and headaches.  Psychiatric/Behavioral: Negative.     Per HPI unless specifically indicated above     Objective:    BP 128/78 (BP Location: Left Arm)    Pulse 72    Temp 98 F (36.7 C) (Oral)    Wt 95 lb 12.8 oz (43.5 kg)    SpO2 96%    BMI 19.35 kg/m   Wt Readings from Last 3 Encounters:  04/08/20 95 lb 12.8 oz (43.5 kg)  03/05/20 100 lb (45.4 kg)  03/26/19 99 lb 3.2 oz (45 kg)    Physical Exam Vitals and nursing note reviewed.  Constitutional:      General: She is awake. She is not in acute distress.    Appearance: She is well-developed. She is not ill-appearing.  HENT:     Head: Normocephalic.     Right Ear: Hearing, tympanic membrane, ear canal and external ear normal.     Left Ear: Hearing, tympanic membrane, ear canal and external ear normal.  Eyes:     General: Lids are normal.        Right eye: No discharge.        Left eye: No discharge.     Conjunctiva/sclera: Conjunctivae normal.     Pupils: Pupils are equal, round, and reactive to light.  Neck:     Thyroid: No thyromegaly.     Vascular: No carotid bruit.  Cardiovascular:     Rate and Rhythm: Normal rate and regular rhythm.     Heart sounds: Normal heart sounds. No murmur heard.  No gallop.   Pulmonary:     Effort: Pulmonary effort is normal. No accessory muscle usage or respiratory distress.     Breath sounds: Normal breath sounds.  Abdominal:     General: Bowel sounds are normal.     Palpations: Abdomen is soft.  Musculoskeletal:     Cervical back: Normal range of  motion and neck supple.     Right lower leg: No edema.     Left lower leg: No edema.  Skin:    General: Skin is warm and dry.  Neurological:     Mental Status: She is alert and oriented to person, place, and time.  Psychiatric:        Attention and Perception: Attention normal.        Mood and Affect: Mood normal.        Behavior: Behavior normal. Behavior is cooperative.        Thought Content: Thought content normal.        Judgment: Judgment normal.  Results for orders placed or performed in visit on 10/08/19  Thyroid Panel With TSH  Result Value Ref Range   TSH 4.820 (H) 0.450 - 4.500 uIU/mL   T4, Total 7.2 4.5 - 12.0 ug/dL   T3 Uptake Ratio 25 24 - 39 %   Free Thyroxine Index 1.8 1.2 - 4.9  Comprehensive metabolic panel  Result Value Ref Range   Glucose 87 65 - 99 mg/dL   BUN 11 8 - 27 mg/dL   Creatinine, Ser 0.69 0.57 - 1.00 mg/dL   GFR calc non Af Amer 88 >59 mL/min/1.73   GFR calc Af Amer 102 >59 mL/min/1.73   BUN/Creatinine Ratio 16 12 - 28   Sodium 140 134 - 144 mmol/L   Potassium 4.8 3.5 - 5.2 mmol/L   Chloride 101 96 - 106 mmol/L   CO2 24 20 - 29 mmol/L   Calcium 9.5 8.7 - 10.3 mg/dL   Total Protein 6.6 6.0 - 8.5 g/dL   Albumin 4.3 3.8 - 4.8 g/dL   Globulin, Total 2.3 1.5 - 4.5 g/dL   Albumin/Globulin Ratio 1.9 1.2 - 2.2   Bilirubin Total 0.5 0.0 - 1.2 mg/dL   Alkaline Phosphatase 95 39 - 117 IU/L   AST 19 0 - 40 IU/L   ALT 12 0 - 32 IU/L  Lipid Panel w/o Chol/HDL Ratio out  Result Value Ref Range   Cholesterol, Total 240 (H) 100 - 199 mg/dL   Triglycerides 110 0 - 149 mg/dL   HDL 80 >39 mg/dL   VLDL Cholesterol Cal 19 5 - 40 mg/dL   LDL Chol Calc (NIH) 141 (H) 0 - 99 mg/dL  Vit D  25 hydroxy (rtn osteoporosis monitoring)  Result Value Ref Range   Vit D, 25-Hydroxy 44.0 30.0 - 100.0 ng/mL  Urine drugs of abuse scrn w alc, routine (Ref Lab)  Result Value Ref Range   Amphetamines, Urine Negative Cutoff=1000 ng/mL   Barbiturate Quant, Ur Negative  Cutoff=300 ng/mL   Benzodiazepine Quant, Ur Negative Cutoff=300 ng/mL   Cannabinoid Quant, Ur Negative Cutoff=50 ng/mL   Cocaine (Metab.) Negative Cutoff=300 ng/mL   Opiate Quant, Ur Negative Cutoff=300 ng/mL   PCP Quant, Ur Negative Cutoff=25 ng/mL   Methadone Screen, Urine Negative Cutoff=300 ng/mL   Propoxyphene Negative Cutoff=300 ng/mL   Ethanol, Urine Negative Cutoff=0.020 %      Assessment & Plan:   Problem List Items Addressed This Visit      Cardiovascular and Mediastinum   Hypertension - Primary    Chronic, stable.  BP slightly elevated today and improved on repeat, at home below goal; suspect more related to white coat syndrome.  Continue current diet control.  Consider Lisinopril if elevation in BP >130/80 consistently at home and office.  Labs next visit.        Endocrine   Hypothyroidism    Subclinical at baseline.  Labs next visit, does not wish to start medication at this time, but will consider dependent on labs.        Other   Hypercholesteremia    Chronic, refuses statin use.  Obtain lipid panel next visit and continue to recommend diet focus.        Chronic anxiety    Chronic, ongoing for years and stable.  Denies SI/HI.  Has been on Xanax for several years.  Refills sent today #30 x 2.  Return in 3 months.  UDS up to date.  Has contract on file.      Relevant Medications  ALPRAZolam (XANAX) 0.5 MG tablet (Start on 04/28/2020)   Long-term current use of benzodiazepine    Check UDS today and continued ongoing discussions about risks with long term benzo use.  She refuses to try alternate medications.          Follow up plan: Return in about 3 months (around 07/09/2020) for HTN/HLD, Thyroid, Anxiety, Osteoporosis.

## 2020-04-08 NOTE — Assessment & Plan Note (Signed)
Chronic, stable.  BP slightly elevated today and improved on repeat, at home below goal; suspect more related to white coat syndrome.  Continue current diet control.  Consider Lisinopril if elevation in BP >130/80 consistently at home and office.  Labs next visit.

## 2020-04-10 LAB — FECAL OCCULT BLOOD, IMMUNOCHEMICAL: IFOBT: NEGATIVE

## 2020-04-14 ENCOUNTER — Ambulatory Visit: Payer: Medicare HMO | Admitting: Nurse Practitioner

## 2020-07-09 ENCOUNTER — Other Ambulatory Visit: Payer: Self-pay

## 2020-07-09 ENCOUNTER — Ambulatory Visit (INDEPENDENT_AMBULATORY_CARE_PROVIDER_SITE_OTHER): Payer: Medicare HMO | Admitting: Nurse Practitioner

## 2020-07-09 ENCOUNTER — Encounter: Payer: Self-pay | Admitting: Nurse Practitioner

## 2020-07-09 VITALS — BP 131/72 | HR 66 | Temp 98.0°F | Wt 98.0 lb

## 2020-07-09 DIAGNOSIS — Z1231 Encounter for screening mammogram for malignant neoplasm of breast: Secondary | ICD-10-CM | POA: Diagnosis not present

## 2020-07-09 DIAGNOSIS — E038 Other specified hypothyroidism: Secondary | ICD-10-CM

## 2020-07-09 DIAGNOSIS — E7800 Pure hypercholesterolemia, unspecified: Secondary | ICD-10-CM

## 2020-07-09 DIAGNOSIS — E559 Vitamin D deficiency, unspecified: Secondary | ICD-10-CM

## 2020-07-09 DIAGNOSIS — I10 Essential (primary) hypertension: Secondary | ICD-10-CM | POA: Diagnosis not present

## 2020-07-09 DIAGNOSIS — F419 Anxiety disorder, unspecified: Secondary | ICD-10-CM

## 2020-07-09 DIAGNOSIS — Z23 Encounter for immunization: Secondary | ICD-10-CM

## 2020-07-09 DIAGNOSIS — M810 Age-related osteoporosis without current pathological fracture: Secondary | ICD-10-CM | POA: Diagnosis not present

## 2020-07-09 DIAGNOSIS — E039 Hypothyroidism, unspecified: Secondary | ICD-10-CM | POA: Diagnosis not present

## 2020-07-09 DIAGNOSIS — Z79899 Other long term (current) drug therapy: Secondary | ICD-10-CM

## 2020-07-09 MED ORDER — ROSUVASTATIN CALCIUM 5 MG PO TABS: ORAL_TABLET | ORAL | 3 refills | Status: DC

## 2020-07-09 MED ORDER — MECLIZINE HCL 25 MG PO TABS: 25.0000 mg | ORAL_TABLET | Freq: Three times a day (TID) | ORAL | 3 refills | Status: DC | PRN

## 2020-07-09 MED ORDER — ALPRAZOLAM 0.5 MG PO TABS: 0.5000 mg | ORAL_TABLET | Freq: Every day | ORAL | 2 refills | Status: DC

## 2020-07-09 NOTE — Assessment & Plan Note (Signed)
Chronic, ongoing for years and stable.  Denies SI/HI.  Has been on Xanax for several years.  Refills sent today #30 x 2.  Return in 3 months.  UDS up to date.  Has contract on file.

## 2020-07-09 NOTE — Assessment & Plan Note (Signed)
Chronic, she does agree to trial statin today.  Will initiate Rosuvastatin, she wishes to trial on 3 day schedule and this statin would offer most benefit on less than daily schedule -- educated her on this.  Obtain lipid today and continue to recommend diet focus as well.  Recheck lipids next visit and she is to notify if any ADR present.

## 2020-07-09 NOTE — Assessment & Plan Note (Signed)
Recheck Vit d level today.

## 2020-07-09 NOTE — Patient Instructions (Signed)
[facility] --  [phone] [facility]  Address: [address], [place], [place] [address]  Phone: [phone]   Osteoporosis  Osteoporosis happens when your bones get thin and weak. This can cause your bones to break (fracture) more easily. You can do things at home to make your bones stronger. Follow these instructions at home:  Activity  Exercise as told by your doctor. Ask your doctor what activities are safe for you. You should do: ? Exercises that make your muscles work to hold your body weight up (weight-bearing exercises). These include tai chi, yoga, and walking. ? Exercises to make your muscles stronger. One example is lifting weights. Lifestyle  Limit alcohol intake to no more than 1 drink a day for nonpregnant women and 2 drinks a day for men. One drink equals 12 oz of beer, 5 oz of wine, or 1 oz of hard liquor.  Do not use any products that have nicotine or tobacco in them. These include cigarettes and e-cigarettes. If you need help quitting, ask your doctor. Preventing falls  Use tools to help you move around (mobility aids) as needed. These include canes, walkers, scooters, and crutches.  Keep rooms well-lit and free of clutter.  Put away things that could make you trip. These include cords and rugs.  Install safety rails on stairs. Install grab bars in bathrooms.  Use rubber mats in slippery areas, like bathrooms.  Wear shoes that: ? Fit you well. ? Support your feet. ? Have closed toes. ? Have rubber soles or low heels.  Tell your doctor about all of the medicines you are taking. Some medicines can make you more likely to fall. General instructions  Eat plenty of calcium and vitamin D. These nutrients are good for your bones. Good sources of calcium and vitamin D include: ? Some fatty fish, such as salmon and tuna. ? Foods that have calcium and vitamin D added to them (fortified foods). For example, some  breakfast cereals are fortified with calcium and vitamin D. ? Egg yolks. ? Cheese. ? Liver.  Take over-the-counter and prescription medicines only as told by your doctor.  Keep all follow-up visits as told by your doctor. This is important. Contact a doctor if:  You have not been tested (screened) for osteoporosis and you are: ? A woman who is age 33 or older. ? A man who is age 72 or older. Get help right away if:  You fall.  You get hurt. Summary  Osteoporosis happens when your bones get thin and weak.  Weak bones can break (fracture) more easily.  Eat plenty of calcium and vitamin D. These nutrients are good for your bones.  Tell your doctor about all of the medicines that you take. This information is not intended to replace advice given to you by your health care provider. Make sure you discuss any questions you have with your health care provider. Document Revised: 08/26/2017 Document Reviewed: 07/08/2017 Elsevier Patient Education  2020 [employer].

## 2020-07-09 NOTE — Assessment & Plan Note (Signed)
Chronic, stable.  BP at goal in office and on home readings.  Continue current diet control.  Consider Lisinopril if elevation in BP >130/80 consistently at home and office.  Labs today. 

## 2020-07-09 NOTE — Assessment & Plan Note (Signed)
Check UDS today and continued ongoing discussions about risks with long term benzo use.  She refuses to try alternate medications.

## 2020-07-09 NOTE — Assessment & Plan Note (Signed)
Subclinical at baseline.  Labs today, does not wish to start medication at this time, but will consider dependent on labs.

## 2020-07-09 NOTE — Progress Notes (Signed)
BP 131/72 (BP Location: Left Arm, Patient Position: Sitting, Cuff Size: Normal)   Pulse 66   Temp 98 F (36.7 C) (Oral)   Wt 98 lb (44.5 kg)   SpO2 98%   BMI 19.79 kg/m    Subjective:    Patient ID: Brandy Chen, female    DOB: Dec 10, 1950, 69 y.o.   MRN: [id number]  HPI: Brandy Chen is a 69 y.o. female  Chief Complaint  Patient presents with  . Hypertension  . Anxiety   HYPERTENSION / HYPERLIPIDEMIA No current medications, other than fish oil and ASA.  Refuses statin or BP medications in past, but today would like to trial one 3 days a week. Satisfied with current treatment? yes Duration of hypertension: chronic BP monitoring frequency: daily BP range: 132/80 this morning BP medication side effects: no Duration of hyperlipidemia: chronic Cholesterol supplements: fish oil Medication compliance: good compliance Aspirin: yes Recent stressors: no Recurrent headaches: no Visual changes: no Palpitations: no Dyspnea: no Chest pain: no Lower extremity edema: no Dizzy/lightheaded: no The 10-year ASCVD risk score [name], et al., 2013) is: 10.9%   Values used to calculate the score:     Age: 8 years     Sex: Female     Is Non-Hispanic African American: No     Diabetic: No     Tobacco smoker: No     Systolic Blood Pressure: 426 mmHg     Is BP treated: No     HDL Cholesterol: 80 mg/dL     Total Cholesterol: 240 mg/dL   OSTEOPOROSIS: Continues on daily Vitamin D and calcium.  Last DEXA on chart is 2016 -- T Score -3.1.  No recent falls or a fractures. Satisfied with current treatment?: yes Past osteoporosis medications/treatments: none Adequate calcium & vitamin D: yes Intolerance to bisphosphonates:never taken, does not wish to Weight bearing exercises: yes  HYPOTHYROIDISM Remains subclinical at baseline, no current medications. January 4.820, T4 7.2.  She reports at one time she was written prescription for it, but did not take.   Thyroid control  status:stable Fatigue: no Cold intolerance: no Heat intolerance: no Weight gain: no Weight loss: no Constipation: no Diarrhea/loose stools: no Palpitations: no Lower extremity edema: no Anxiety/depressed mood: no   ANXIETY/STRESS Has been on Xanax at bedtime for long period. She tried Sertraline, but reports this was not successful. Wishes not to trial other medication at this time, would prefer to try herbal remedies.  Pt isaware of risks ofbenzomedication use to include increased sedation, respiratory suppression, falls, dependence and cardiovascular events. Ptwould like to continue treatment as benefit. Currently she is taking 1/2 tablet Xanax during day and 1/2 at night. This works for her per her report and helps her mood. Again reviewed BEERS criteria with her and she is thinking about trying to slowly decrease use of Xanax on her own, discussed to try this slowly. PDMP last fill 06/27/20. Duration:stable Anxious mood: no  Excessive worrying: no Irritability: no  Sweating: no Nausea: no Palpitations:no Hyperventilation: no Panic attacks: no Agoraphobia: no  Obscessions/compulsions: no Depressed mood: no Depression screen [name] 2/9 07/09/2020 04/08/2020 03/05/2020 10/08/2019 06/25/2019  Decreased Interest 0 1 0 2 1  Down, Depressed, Hopeless 0 1 1 0 0  PHQ - 2 Score 0 2 1 2 1   Altered sleeping 1 0 - 0 0  Tired, decreased energy 0 1 - 2 1  Change in appetite 0 1 - 0 1  Feeling bad or failure about yourself  0 0 - 0 0  Trouble concentrating 0 0 - 2 0  Moving slowly or fidgety/restless 0 0 - 0 0  Suicidal thoughts 0 0 - 0 0  PHQ-9 Score 1 4 - 6 3  Difficult doing work/chores Not difficult at all Not difficult at all - - Not difficult at all  Some recent data might be hidden   Anhedonia: no Weight changes: no Insomnia: yes hard to fall asleep  Hypersomnia: no Fatigue/loss of energy: no Feelings of worthlessness: no Feelings of guilt: no Impaired  concentration/indecisiveness: no Suicidal ideations: no  Crying spells: no Recent Stressors/Life Changes: no   Relationship problems: no   Family stress: no     Financial stress: no    Job stress: no    Recent death/loss: no GAD 7 : Generalized Anxiety Score 07/09/2020 04/08/2020 10/08/2019 06/25/2019  Nervous, Anxious, on Edge 0 1 2 0  Control/stop worrying 0 0 2 0  Worry too much - different things 0 0 3 0  Trouble relaxing 0 1 1 1   Restless 0 0 1 0  Easily annoyed or irritable 0 1 2 0  Afraid - awful might happen 0 0 0 0  Total GAD 7 Score 0 3 11 1   Anxiety Difficulty - Not difficult at all - Not difficult at all    Relevant past medical, surgical, family and social history reviewed and updated as indicated. Interim medical history since our last visit reviewed. Allergies and medications reviewed and updated.  Review of Systems  Constitutional: Negative for activity change, appetite change, diaphoresis, fatigue and fever.  Respiratory: Negative for cough, chest tightness and shortness of breath.   Cardiovascular: Negative for chest pain, palpitations and leg swelling.  Gastrointestinal: Negative for abdominal distention, abdominal pain, constipation, diarrhea, nausea and vomiting.  Neurological: Negative for dizziness, syncope, weakness, light-headedness, numbness and headaches.  Psychiatric/Behavioral: Negative.     Per HPI unless specifically indicated above     Objective:    BP 131/72 (BP Location: Left Arm, Patient Position: Sitting, Cuff Size: Normal)   Pulse 66   Temp 98 F (36.7 C) (Oral)   Wt 98 lb (44.5 kg)   SpO2 98%   BMI 19.79 kg/m   Wt Readings from Last 3 Encounters:  07/09/20 98 lb (44.5 kg)  04/08/20 95 lb 12.8 oz (43.5 kg)  03/05/20 100 lb (45.4 kg)    Physical Exam Vitals and nursing note reviewed.  Constitutional:      General: She is awake. She is not in acute distress.    Appearance: She is well-developed. She is not ill-appearing.  HENT:      Head: Normocephalic.     Right Ear: Hearing, tympanic membrane, ear canal and external ear normal.     Left Ear: Hearing, tympanic membrane, ear canal and external ear normal.  Eyes:     General: Lids are normal.        Right eye: No discharge.        Left eye: No discharge.     Conjunctiva/sclera: Conjunctivae normal.     Pupils: Pupils are equal, round, and reactive to light.  Neck:     Thyroid: No thyromegaly.     Vascular: No carotid bruit.  Cardiovascular:     Rate and Rhythm: Normal rate and regular rhythm.     Heart sounds: Normal heart sounds. No murmur heard.  No gallop.   Pulmonary:     Effort: Pulmonary effort is normal. No accessory muscle usage or  respiratory distress.     Breath sounds: Normal breath sounds.  Abdominal:     General: Bowel sounds are normal.     Palpations: Abdomen is soft.  Musculoskeletal:     Cervical back: Normal range of motion and neck supple.     Right lower leg: No edema.     Left lower leg: No edema.  Skin:    General: Skin is warm and dry.  Neurological:     Mental Status: She is alert and oriented to person, place, and time.  Psychiatric:        Attention and Perception: Attention normal.        Mood and Affect: Mood normal.        Behavior: Behavior normal. Behavior is cooperative.        Thought Content: Thought content normal.        Judgment: Judgment normal.     Results for orders placed or performed in visit on 04/23/20  Fecal occult blood, imunochemical  Result Value Ref Range   IFOBT Negative       Assessment & Plan:   Problem List Items Addressed This Visit      Cardiovascular and Mediastinum   Hypertension - Primary    Chronic, stable.  BP at goal in office and on home readings.  Continue current diet control.  Consider Lisinopril if elevation in BP >130/80 consistently at home and office.  Labs today.      Relevant Medications   rosuvastatin (CRESTOR) 5 MG tablet   Other Relevant Orders   Basic metabolic  panel     Endocrine   Subclinical hypothyroidism    Subclinical at baseline.  Labs today, does not wish to start medication at this time, but will consider dependent on labs.      Relevant Orders   TSH   T4, free     Musculoskeletal and Integument   Osteoporosis    Agrees to repeat DEXA, discussed at length, does not wish medication for osteoporosis.  Continue Vit D and calcium at home.  Check Vit D today.      Relevant Orders   DG Bone Density   VITAMIN D 25 Hydroxy (Vit-D Deficiency, Fractures)     Other   Hypercholesteremia    Chronic, she does agree to trial statin today.  Will initiate Rosuvastatin, she wishes to trial on 3 day schedule and this statin would offer most benefit on less than daily schedule -- educated her on this.  Obtain lipid today and continue to recommend diet focus as well.  Recheck lipids next visit and she is to notify if any ADR present.      Relevant Medications   rosuvastatin (CRESTOR) 5 MG tablet   Other Relevant Orders   Lipid Panel w/o Chol/HDL Ratio   Vitamin D deficiency    Recheck Vit d level today.      Chronic anxiety    Chronic, ongoing for years and stable.  Denies SI/HI.  Has been on Xanax for several years.  Refills sent today #30 x 2.  Return in 3 months.  UDS up to date.  Has contract on file.      Long-term current use of benzodiazepine    Check UDS today and continued ongoing discussions about risks with long term benzo use.  She refuses to try alternate medications.       Other Visit Diagnoses    Need for influenza vaccination       Flu vaccine today  Relevant Orders   Flu Vaccine QUAD High Dose(Fluad) (Completed)   Encounter for screening mammogram for malignant neoplasm of breast       Mammo ordered   Relevant Orders   MM DIGITAL SCREENING BILATERAL       Follow up plan: Return in about 3 months (around 10/09/2020) for MOOD -- will need UDS, but can do virtual.

## 2020-07-09 NOTE — Assessment & Plan Note (Signed)
Agrees to repeat DEXA, discussed at length, does not wish medication for osteoporosis.  Continue Vit D and calcium at home.  Check Vit D today.

## 2020-07-10 LAB — BASIC METABOLIC PANEL
BUN/Creatinine Ratio: 18 (ref 12–28)
BUN: 12 mg/dL (ref 8–27)
CO2: 27 mmol/L (ref 20–29)
Calcium: 9.6 mg/dL (ref 8.7–10.3)
Chloride: 99 mmol/L (ref 96–106)
Creatinine, Ser: 0.67 mg/dL (ref 0.57–1.00)
GFR calc Af Amer: 102 mL/min/{1.73_m2} (ref >59)
GFR calc non Af Amer: 89 mL/min/{1.73_m2} (ref >59)
Glucose: 80 mg/dL (ref 65–99)
Potassium: 4.6 mmol/L (ref 3.5–5.2)
Sodium: 139 mmol/L (ref 134–144)

## 2020-07-10 LAB — T4, FREE: Free T4: 1.24 ng/dL (ref 0.82–1.77)

## 2020-07-10 LAB — LIPID PANEL W/O CHOL/HDL RATIO
Cholesterol, Total: 233 mg/dL — ABNORMAL HIGH (ref 100–199)
HDL: 75 mg/dL (ref >39)
LDL Chol Calc (NIH): 139 mg/dL — ABNORMAL HIGH (ref 0–99)
Triglycerides: 111 mg/dL (ref 0–149)
VLDL Cholesterol Cal: 19 mg/dL (ref 5–40)

## 2020-07-10 LAB — TSH: TSH: 4.25 u[IU]/mL (ref 0.450–4.500)

## 2020-07-10 LAB — VITAMIN D 25 HYDROXY (VIT D DEFICIENCY, FRACTURES): Vit D, 25-Hydroxy: 44.2 ng/mL (ref 30.0–100.0)

## 2020-07-10 NOTE — Progress Notes (Signed)
Please let Edwyna know her labs have returned.  Thyroid levels returned normal this check.  Kidney function and electrolytes + Vitamin D are normal.  Cholesterol levels remain elevated, we will see how you tolerate the low dose of Rosuvastatin, which I suspect will help some with this.  Any questions?  Have a great day!! Keep being awesome!!  Thank you for allowing me to participate in your care. Kindest regards, Marq Rebello

## 2020-07-16 DIAGNOSIS — Z1231 Encounter for screening mammogram for malignant neoplasm of breast: Secondary | ICD-10-CM | POA: Diagnosis not present

## 2020-08-19 DIAGNOSIS — H43812 Vitreous degeneration, left eye: Secondary | ICD-10-CM | POA: Diagnosis not present

## 2020-08-19 DIAGNOSIS — H2513 Age-related nuclear cataract, bilateral: Secondary | ICD-10-CM | POA: Diagnosis not present

## 2020-10-14 ENCOUNTER — Ambulatory Visit: Payer: Medicare HMO | Admitting: Nurse Practitioner

## 2020-10-23 ENCOUNTER — Telehealth: Payer: Self-pay | Admitting: Nurse Practitioner

## 2020-10-23 NOTE — Telephone Encounter (Signed)
Error

## 2020-10-27 ENCOUNTER — Ambulatory Visit (INDEPENDENT_AMBULATORY_CARE_PROVIDER_SITE_OTHER): Payer: Medicare HMO | Admitting: Nurse Practitioner

## 2020-10-27 ENCOUNTER — Other Ambulatory Visit: Payer: Self-pay

## 2020-10-27 ENCOUNTER — Encounter: Payer: Self-pay | Admitting: Nurse Practitioner

## 2020-10-27 VITALS — BP 128/75 | HR 73 | Temp 97.7°F | Ht 58.03 in | Wt 96.4 lb

## 2020-10-27 DIAGNOSIS — Z79899 Other long term (current) drug therapy: Secondary | ICD-10-CM | POA: Diagnosis not present

## 2020-10-27 DIAGNOSIS — F419 Anxiety disorder, unspecified: Secondary | ICD-10-CM

## 2020-10-27 MED ORDER — ALPRAZOLAM 0.5 MG PO TABS: 0.5000 mg | ORAL_TABLET | Freq: Every day | ORAL | 2 refills | Status: DC

## 2020-10-27 NOTE — Progress Notes (Signed)
BP 128/75   Pulse 73   Temp 97.7 F (36.5 C) (Oral)   Ht 4' 10.03" (1.474 m)   Wt 96 lb 6.4 oz (43.7 kg)   SpO2 97%   BMI 20.13 kg/m    Subjective:    Patient ID: Brandy Chen, female    DOB: 1951-01-23, 70 y.o.   MRN: [id number]  HPI: Brandy Chen is a 70 y.o. female  Chief Complaint  Patient presents with  . Medication Refill  . Mood   ANXIETY/STRESS Has been on Xanax at bedtime for long period, years. She has tried Sertraline, but reports this was not successful + multiple other medications. Wishes not to trial other medication at this time, would prefer to try herbal remedies.  Pt isaware of risks ofbenzomedication use to include increased sedation, respiratory suppression, falls, dependence and cardiovascular events. Ptwould like to continue treatment as benefit. Currently she is taking 1/2 tablet Xanax during day and 1/2 at night. This works for her per her report and helps her mood. Again reviewed BEERS criteria with her. PDMP last fill 09/27/20 Duration: stable Anxious mood: yes Excessive worrying: yes Irritability: no  Sweating: no Nausea: no Palpitations:no Hyperventilation: no Panic attacks: no Agoraphobia: no  Obscessions/compulsions: no Depressed mood: a little bit Depression screen [facility] 2/9 10/27/2020 07/09/2020 04/08/2020 03/05/2020 10/08/2019  Decreased Interest 1 0 1 0 2  Down, Depressed, Hopeless 1 0 1 1 0  PHQ - 2 Score 2 0 2 1 2   Altered sleeping 0 1 0 - 0  Tired, decreased energy 1 0 1 - 2  Change in appetite 2 0 1 - 0  Feeling bad or failure about yourself  3 0 0 - 0  Trouble concentrating 1 0 0 - 2  Moving slowly or fidgety/restless 0 0 0 - 0  Suicidal thoughts 0 0 0 - 0  PHQ-9 Score 9 1 4  - 6  Difficult doing work/chores - Not difficult at all Not difficult at all - -  Some recent data might be hidden   Anhedonia: no Weight changes: no Insomnia: yes hard to fall asleep --- Xanax helps Hypersomnia: no Fatigue/loss of energy:  no Feelings of worthlessness: no Feelings of guilt: no Impaired concentration/indecisiveness: no Suicidal ideations: no  Crying spells: no Recent Stressors/Life Changes: yes -- Covid   Relationship problems: no   Family stress: no     Financial stress: no    Job stress: no    Recent death/loss: no GAD 7 : Generalized Anxiety Score 10/27/2020 07/09/2020 04/08/2020 10/08/2019  Nervous, Anxious, on Edge 3 0 1 2  Control/stop worrying 1 0 0 2  Worry too much - different things 1 0 0 3  Trouble relaxing 1 0 1 1  Restless 0 0 0 1  Easily annoyed or irritable 1 0 1 2  Afraid - awful might happen 1 0 0 0  Total GAD 7 Score 8 0 3 11  Anxiety Difficulty Somewhat difficult - Not difficult at all -    Relevant past medical, surgical, family and social history reviewed and updated as indicated. Interim medical history since our last visit reviewed. Allergies and medications reviewed and updated.  Review of Systems  Constitutional: Negative for activity change, appetite change, diaphoresis, fatigue and fever.  Respiratory: Negative for cough, chest tightness and shortness of breath.   Cardiovascular: Negative for chest pain, palpitations and leg swelling.  Gastrointestinal: Negative for abdominal distention, abdominal pain, constipation, diarrhea, nausea and vomiting.  Neurological: Negative  for dizziness, syncope, weakness, light-headedness, numbness and headaches.  Psychiatric/Behavioral: Negative.     Per HPI unless specifically indicated above     Objective:    BP 128/75   Pulse 73   Temp 97.7 F (36.5 C) (Oral)   Ht 4' 10.03" (1.474 m)   Wt 96 lb 6.4 oz (43.7 kg)   SpO2 97%   BMI 20.13 kg/m   Wt Readings from Last 3 Encounters:  10/27/20 96 lb 6.4 oz (43.7 kg)  07/09/20 98 lb (44.5 kg)  04/08/20 95 lb 12.8 oz (43.5 kg)    Physical Exam Vitals and nursing note reviewed.  Constitutional:      General: She is awake. She is not in acute distress.    Appearance: She is  well-developed. She is not ill-appearing.  HENT:     Head: Normocephalic.     Right Ear: Hearing, tympanic membrane, ear canal and external ear normal.     Left Ear: Hearing, tympanic membrane, ear canal and external ear normal.  Eyes:     General: Lids are normal.        Right eye: No discharge.        Left eye: No discharge.     Conjunctiva/sclera: Conjunctivae normal.     Pupils: Pupils are equal, round, and reactive to light.  Neck:     Thyroid: No thyromegaly.     Vascular: No carotid bruit.  Cardiovascular:     Rate and Rhythm: Normal rate and regular rhythm.     Heart sounds: Normal heart sounds. No murmur heard. No gallop.   Pulmonary:     Effort: Pulmonary effort is normal. No accessory muscle usage or respiratory distress.     Breath sounds: Normal breath sounds.  Abdominal:     General: Bowel sounds are normal.     Palpations: Abdomen is soft.  Musculoskeletal:     Cervical back: Normal range of motion and neck supple.     Right lower leg: No edema.     Left lower leg: No edema.  Skin:    General: Skin is warm and dry.  Neurological:     Mental Status: She is alert and oriented to person, place, and time.  Psychiatric:        Attention and Perception: Attention normal.        Mood and Affect: Mood normal.        Behavior: Behavior normal. Behavior is cooperative.        Thought Content: Thought content normal.        Judgment: Judgment normal.    Results for orders placed or performed in visit on 07/09/20  TSH  Result Value Ref Range   TSH 4.250 0.450 - 4.500 uIU/mL  T4, free  Result Value Ref Range   Free T4 1.24 0.82 - 1.77 ng/dL  Lipid Panel w/o Chol/HDL Ratio  Result Value Ref Range   Cholesterol, Total 233 (H) 100 - 199 mg/dL   Triglycerides 111 0 - 149 mg/dL   HDL 75 >39 mg/dL   VLDL Cholesterol Cal 19 5 - 40 mg/dL   LDL Chol Calc (NIH) 139 (H) 0 - 99 mg/dL  Basic metabolic panel  Result Value Ref Range   Glucose 80 65 - 99 mg/dL   BUN 12 8 -  27 mg/dL   Creatinine, Ser 0.67 0.57 - 1.00 mg/dL   GFR calc non Af Amer 89 >59 mL/min/1.73   GFR calc Af Amer 102 >59 mL/min/1.73  BUN/Creatinine Ratio 18 12 - 28   Sodium 139 134 - 144 mmol/L   Potassium 4.6 3.5 - 5.2 mmol/L   Chloride 99 96 - 106 mmol/L   CO2 27 20 - 29 mmol/L   Calcium 9.6 8.7 - 10.3 mg/dL  VITAMIN D 25 Hydroxy (Vit-D Deficiency, Fractures)  Result Value Ref Range   Vit D, 25-Hydroxy 44.2 30.0 - 100.0 ng/mL      Assessment & Plan:   Problem List Items Addressed This Visit      Other   Chronic anxiety    Chronic, ongoing for years and stable.  Denies SI/HI.  Has been on Xanax for several years.  Refills sent today #30 x 2.  Return in 3 months.  UDS today for yearly check.  Has contract on file.      Long-term current use of benzodiazepine - Primary    Check UDS today and continued ongoing discussions about risks with long term benzo use.  She refuses to try alternate medications.      Relevant Orders   [id number] 11+Oxyco+Alc+Crt-Bund       Follow up plan: Return in about 3 months (around 01/24/2021) for MOOD.

## 2020-10-27 NOTE — Patient Instructions (Signed)

## 2020-10-27 NOTE — Assessment & Plan Note (Signed)
Check UDS today and continued ongoing discussions about risks with long term benzo use.  She refuses to try alternate medications. 

## 2020-10-27 NOTE — Assessment & Plan Note (Signed)
Chronic, ongoing for years and stable.  Denies SI/HI.  Has been on Xanax for several years.  Refills sent today #30 x 2.  Return in 3 months.  UDS today for yearly check.  Has contract on file.

## 2020-11-06 LAB — BENZODIAZEPINES CONFIRM, URINE
Alprazolam Conf.: 106 ng/mL
Alprazolam: POSITIVE — AB
Benzodiazepines: POSITIVE ng/mL — AB
Clonazepam: NEGATIVE
Flurazepam: NEGATIVE
Lorazepam: NEGATIVE
Midazolam: NEGATIVE
Nordiazepam: NEGATIVE
Oxazepam: NEGATIVE
Temazepam: NEGATIVE
Triazolam: NEGATIVE

## 2020-11-06 LAB — DRUG SCREEN 764883 11+OXYCO+ALC+CRT-BUND
Amphetamines, Urine: NEGATIVE ng/mL
Barbiturate: NEGATIVE ng/mL
Cannabinoid Quant, Ur: NEGATIVE ng/mL
Cocaine (Metabolite): NEGATIVE ng/mL
Creatinine: 59.7 mg/dL (ref 20.0–300.0)
Ethanol: NEGATIVE %
Meperidine: NEGATIVE ng/mL
Methadone Screen, Urine: NEGATIVE ng/mL
OPIATE SCREEN URINE: NEGATIVE ng/mL
Oxycodone/Oxymorphone, Urine: NEGATIVE ng/mL
Phencyclidine: NEGATIVE ng/mL
Propoxyphene: NEGATIVE ng/mL
Tramadol: NEGATIVE ng/mL
pH, Urine: 5 (ref 4.5–8.9)

## 2020-12-09 ENCOUNTER — Other Ambulatory Visit: Payer: Self-pay

## 2020-12-09 ENCOUNTER — Encounter: Payer: Self-pay | Admitting: Nurse Practitioner

## 2020-12-09 ENCOUNTER — Ambulatory Visit (INDEPENDENT_AMBULATORY_CARE_PROVIDER_SITE_OTHER): Payer: Medicare HMO | Admitting: Nurse Practitioner

## 2020-12-09 VITALS — BP 123/76 | HR 76 | Temp 98.1°F | Wt 98.8 lb

## 2020-12-09 DIAGNOSIS — R42 Dizziness and giddiness: Secondary | ICD-10-CM

## 2020-12-09 DIAGNOSIS — H6122 Impacted cerumen, left ear: Secondary | ICD-10-CM | POA: Diagnosis not present

## 2020-12-09 DIAGNOSIS — H6981 Other specified disorders of Eustachian tube, right ear: Secondary | ICD-10-CM | POA: Insufficient documentation

## 2020-12-09 DIAGNOSIS — H6120 Impacted cerumen, unspecified ear: Secondary | ICD-10-CM | POA: Insufficient documentation

## 2020-12-09 MED ORDER — PREDNISONE 20 MG PO TABS: 40.0000 mg | ORAL_TABLET | Freq: Every day | ORAL | 0 refills | Status: AC

## 2020-12-09 MED ORDER — MECLIZINE HCL 25 MG PO TABS: 25.0000 mg | ORAL_TABLET | Freq: Three times a day (TID) | ORAL | 3 refills | Status: DC | PRN

## 2020-12-09 NOTE — Progress Notes (Signed)
BP 123/76   Pulse 76   Temp 98.1 F (36.7 C) (Oral)   Wt 98 lb 12.8 oz (44.8 kg)   SpO2 99%   BMI 20.63 kg/m    Subjective:    Patient ID: Brandy Chen, female    DOB: 02-16-1951, 70 y.o.   MRN: [id number]  HPI: Brandy Chen is a 70 y.o. female  Chief Complaint  Patient presents with  . Dizziness    Patient states she has been having dizziness episodes where it comes and goes for about 4 weeks now and wanted to it check out by provider. Patient states when she wakes up in the mornings she thinks she is better and then later on throughout the day she will notice it.   DIZZINESS Started about 4 weeks ago, come and go.  First couple weeks was pretty steady, like a mini swirl.  Has had dizzy episodes in past, but this is lasting longer.  Last treated 12/15/2018 -- had CT head and no intracranial abnormalities.  Has taken Meclizine with some benefit.  Also took a leftover antibiotic from dentist and this is not helping. Has done a bunch of head movements at home. Duration: weeks Description of symptoms: ill-defined Duration of episode: seconds Dizziness frequency: recurrent Provoking factors: none Aggravating factors:  movement Triggered by rolling over in bed: yes Triggered by bending over: yes Aggravated by head movement: yes Aggravated by exertion, coughing, loud noises: no Recent head injury: no Recent or current viral symptoms: no History of vasovagal episodes: no Nausea: no Vomiting: no Tinnitus: occasional at baseline Hearing loss: no Aural fullness: no Headache: no Photophobia/phonophobia: yes to light on occasiona Unsteady gait: no Postural instability: no Diplopia, dysarthria, dysphagia or weakness: no Related to exertion: no Pallor: no Diaphoresis: no Dyspnea: no Chest pain: no  Relevant past medical, surgical, family and social history reviewed and updated as indicated. Interim medical history since our last visit reviewed. Allergies and medications  reviewed and updated.  Review of Systems  Constitutional: Negative for activity change, appetite change, diaphoresis, fatigue and fever.  Respiratory: Negative for cough, chest tightness and shortness of breath.   Cardiovascular: Negative for chest pain, palpitations and leg swelling.  Gastrointestinal: Negative.   Endocrine: Negative.   Neurological: Positive for dizziness. Negative for syncope, weakness, light-headedness, numbness and headaches.  Psychiatric/Behavioral: Negative.     Per HPI unless specifically indicated above     Objective:    BP 123/76   Pulse 76   Temp 98.1 F (36.7 C) (Oral)   Wt 98 lb 12.8 oz (44.8 kg)   SpO2 99%   BMI 20.63 kg/m   Wt Readings from Last 3 Encounters:  12/09/20 98 lb 12.8 oz (44.8 kg)  10/27/20 96 lb 6.4 oz (43.7 kg)  07/09/20 98 lb (44.5 kg)    Physical Exam Vitals and nursing note reviewed.  Constitutional:      General: She is awake. She is not in acute distress.    Appearance: She is well-developed. She is not ill-appearing.  HENT:     Head: Normocephalic.     Right Ear: Hearing, ear canal and external ear normal. A middle ear effusion is present.     Left Ear: Hearing, ear canal and external ear normal. There is impacted cerumen.  Eyes:     General: Lids are normal.        Right eye: No discharge.        Left eye: No discharge.  Extraocular Movements: Extraocular movements intact.     Conjunctiva/sclera: Conjunctivae normal.     Pupils: Pupils are equal, round, and reactive to light.     Visual Fields: Right eye visual fields normal and left eye visual fields normal.  Neck:     Thyroid: No thyromegaly.     Vascular: No carotid bruit.  Cardiovascular:     Rate and Rhythm: Normal rate and regular rhythm.     Heart sounds: Normal heart sounds. No murmur heard. No gallop.   Pulmonary:     Effort: Pulmonary effort is normal. No accessory muscle usage or respiratory distress.     Breath sounds: Normal breath sounds.   Abdominal:     General: Bowel sounds are normal.     Palpations: Abdomen is soft.  Musculoskeletal:     Cervical back: Normal range of motion and neck supple.     Right lower leg: No edema.     Left lower leg: No edema.  Skin:    General: Skin is warm and dry.  Neurological:     Mental Status: She is alert and oriented to person, place, and time.     Cranial Nerves: Cranial nerves are intact.     Motor: Motor function is intact.     Coordination: Coordination is intact. Romberg sign negative.     Gait: Gait is intact.     Deep Tendon Reflexes: Reflexes are normal and symmetric.     Reflex Scores:      Brachioradialis reflexes are 2+ on the right side and 2+ on the left side.      Patellar reflexes are 2+ on the right side and 2+ on the left side. Psychiatric:        Attention and Perception: Attention normal.        Mood and Affect: Mood normal.        Speech: Speech normal.        Behavior: Behavior normal. Behavior is cooperative.        Thought Content: Thought content normal.    Results for orders placed or performed in visit on 10/27/20  [id number] 11+Oxyco+Alc+Crt-Bund  Result Value Ref Range   Ethanol Negative Cutoff=0.020 %   Amphetamines, Urine Negative Cutoff=1000 ng/mL   Barbiturate Negative Cutoff=200 ng/mL   BENZODIAZ UR QL See Final Results Cutoff=200 ng/mL   Cannabinoid Quant, Ur Negative Cutoff=50 ng/mL   Cocaine (Metabolite) Negative Cutoff=300 ng/mL   OPIATE SCREEN URINE Negative Cutoff=300 ng/mL   Oxycodone/Oxymorphone, Urine Negative Cutoff=300 ng/mL   Phencyclidine Negative Cutoff=25 ng/mL   Methadone Screen, Urine Negative Cutoff=300 ng/mL   Propoxyphene Negative Cutoff=300 ng/mL   Meperidine Negative Cutoff=200 ng/mL   Tramadol Negative Cutoff=200 ng/mL   Creatinine 59.7 20.0 - 300.0 mg/dL   pH, Urine 5.0 4.5 - 8.9  Benzodiazepines Confirm, Urine  Result Value Ref Range   Benzodiazepines Positive (A) Cutoff=100 ng/mL   Nordiazepam Negative  Cutoff=100   Oxazepam Negative Cutoff=100   Flurazepam Negative Cutoff=100   Lorazepam Negative Cutoff=100   Alprazolam Positive (A)    Alprazolam Conf. 106 Cutoff=100 ng/mL   Clonazepam Negative Cutoff=100   Temazepam Negative Cutoff=100   Triazolam Negative Cutoff=100   Midazolam Negative Cutoff=100      Assessment & Plan:   Problem List Items Addressed This Visit      Nervous and Auditory   Cerumen impaction    She does not want left ear irrigated in office today, would prefer to use Debrox at home and if unable  to clear will return to office for assist, this is acceptable.  Return to office in 2 weeks, sooner if worsening.        Other   Dizziness - Primary    Acute with normal neuro exam today and no red flags.  Has dizziness on/off at baseline takes Meclizine for.  Refill Meclizine and script for Prednisone for eustachian tube dysfunction noted on exam.  She does not want left ear irrigated in office today, would prefer to use Debrox at home and if unable to clear will return to office for assist, this is acceptable.  Recommend she start taking daily Claritin 10 MG and Flonase use for underlying allergic rhinitis symptoms, as suspect this causes flares in her dizziness.  Return to office in 2 weeks, sooner if worsening.          Follow up plan: Return in about 2 weeks (around 12/23/2020) for Dizziness.

## 2020-12-09 NOTE — Assessment & Plan Note (Signed)
She does not want left ear irrigated in office today, would prefer to use Debrox at home and if unable to clear will return to office for assist, this is acceptable.  Return to office in 2 weeks, sooner if worsening.

## 2020-12-09 NOTE — Patient Instructions (Signed)

## 2020-12-09 NOTE — Assessment & Plan Note (Signed)
Acute with normal neuro exam today and no red flags.  Has dizziness on/off at baseline takes Meclizine for.  Refill Meclizine and script for Prednisone for eustachian tube dysfunction noted on exam.  She does not want left ear irrigated in office today, would prefer to use Debrox at home and if unable to clear will return to office for assist, this is acceptable.  Recommend she start taking daily Claritin 10 MG and Flonase use for underlying allergic rhinitis symptoms, as suspect this causes flares in her dizziness.  Return to office in 2 weeks, sooner if worsening.

## 2020-12-23 ENCOUNTER — Ambulatory Visit: Payer: Medicare HMO | Admitting: Nurse Practitioner

## 2021-01-04 IMAGING — CT CT HEAD WITHOUT CONTRAST
3 of 4 series · 14 of 47 positions shown, 16 images · non-contrast
Comparison: None.

CLINICAL DATA: Fall 5 days ago.  Dizziness

EXAM:
CT HEAD WITHOUT CONTRAST
TECHNIQUE: Contiguous axial images were obtained from the base of the skull
through the vertex without intravenous contrast.

[Series 5: head · axial · 0.32mm/px · z∈[-606,-490]mm · 8 of 74 slices shown, 10 images (1 of 3)]
[im 7/74  brain]
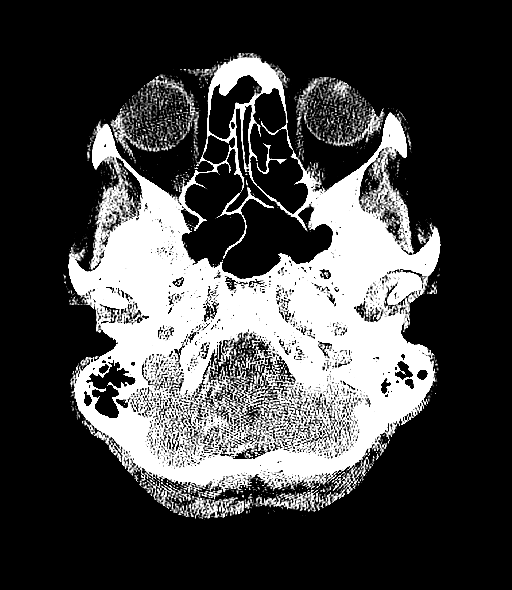
[im 7/74  bone]
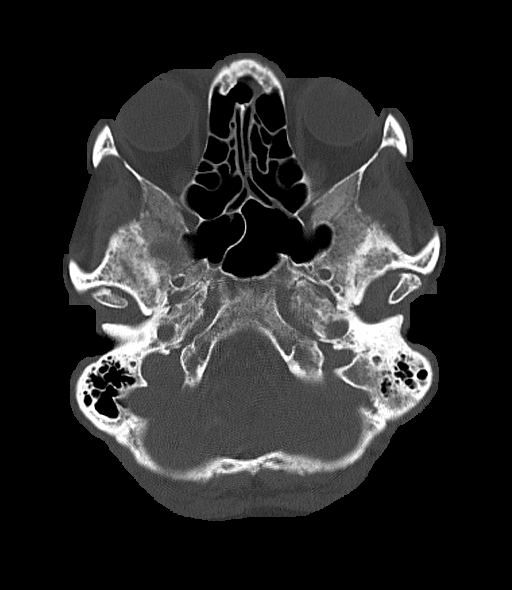
[im 14/74  brain]
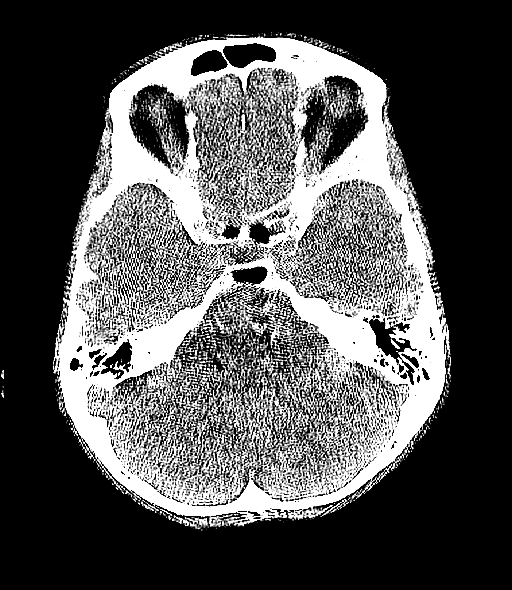
[im 25/74  brain]
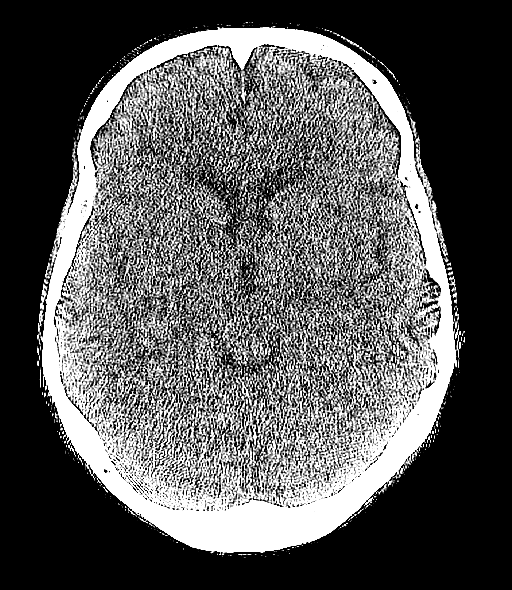
[im 32/74  brain]
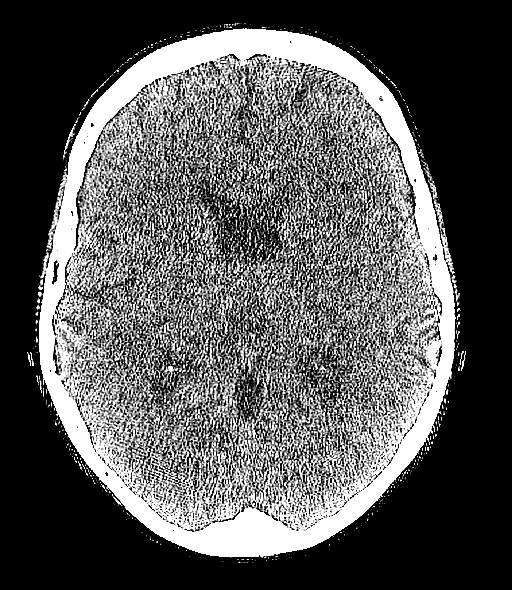
[im 42/74  brain]
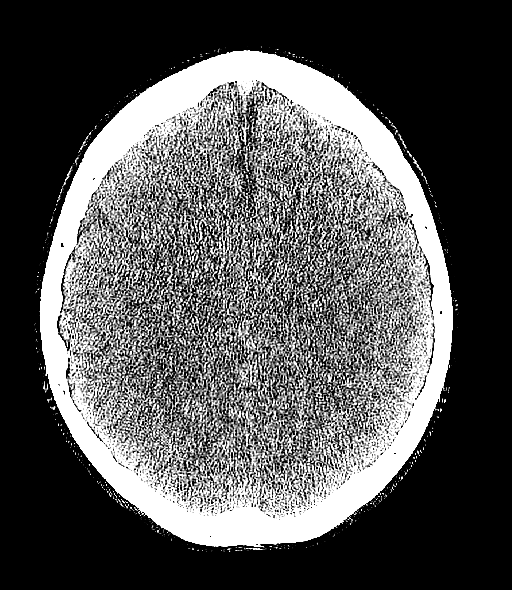
[im 42/74  bone]
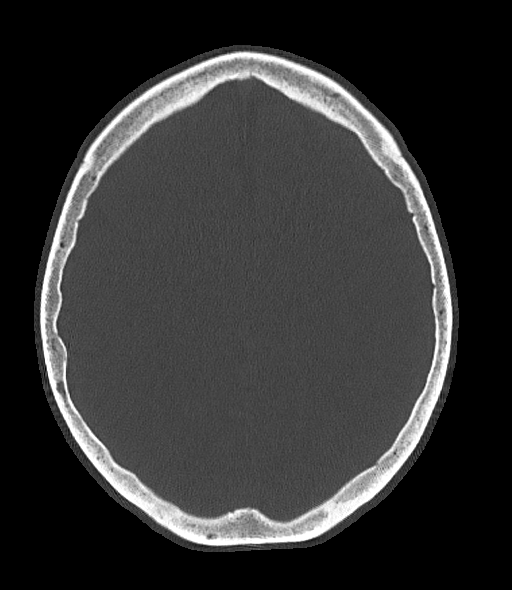
[im 49/74  brain]
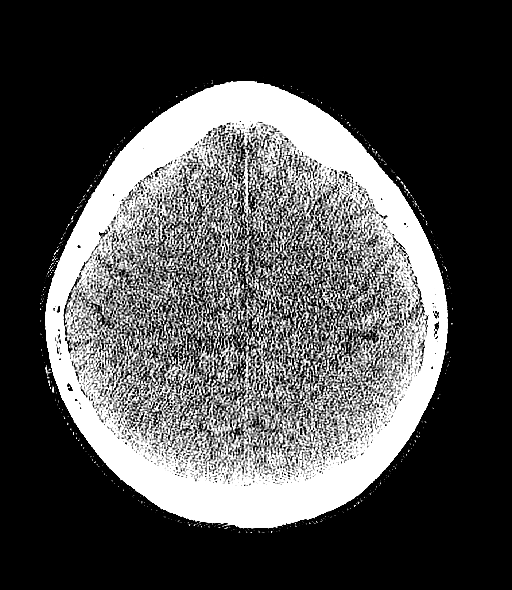
[im 60/74  brain]
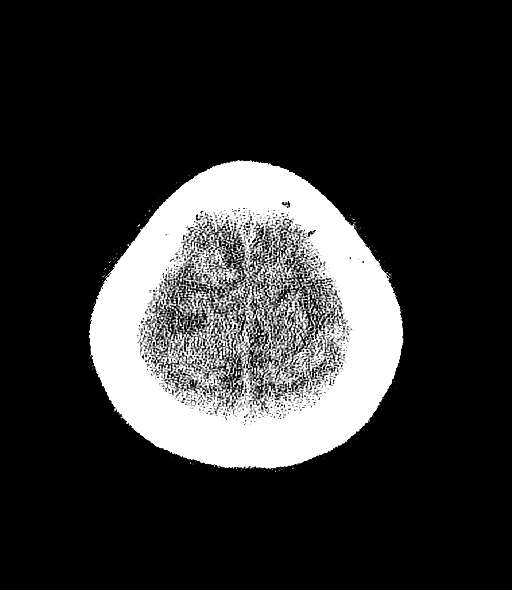
[im 67/74  brain]
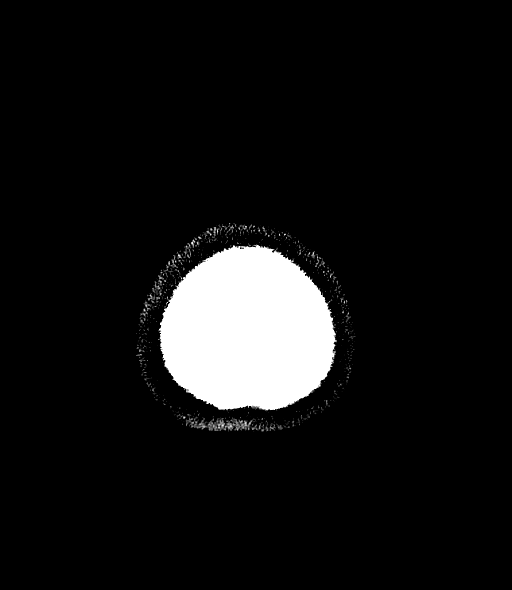

[Series 7: head · coronal · 0.29mm/px · 3 of 63 slices shown (2 of 3)]
[im 21/63  brain]
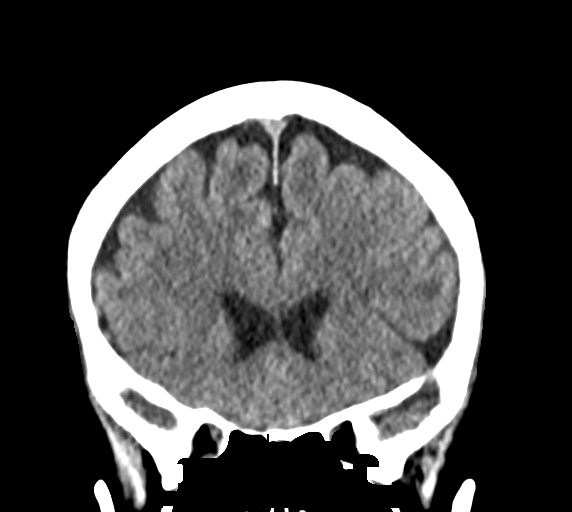
[im 28/63  brain]
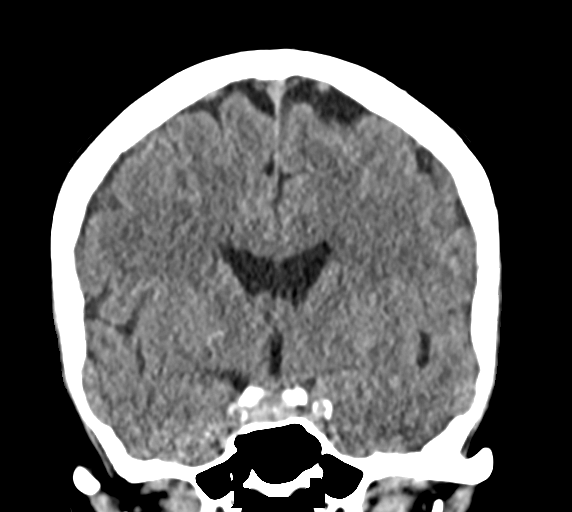
[im 35/63  brain]
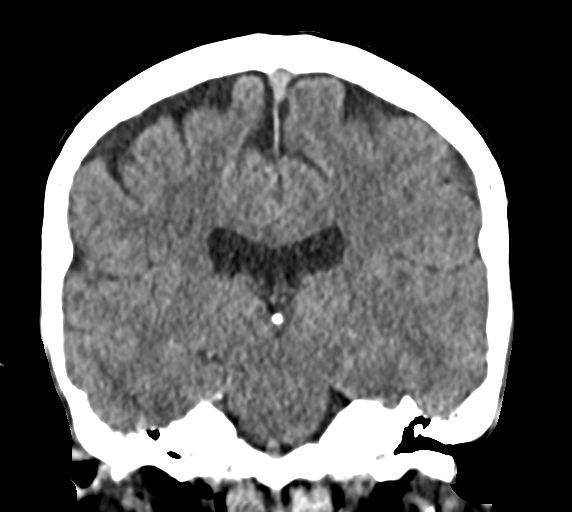

[Series 9: head · sagittal · 0.29mm/px · 3 of 55 slices shown (3 of 3)]
[im 19/55  brain]
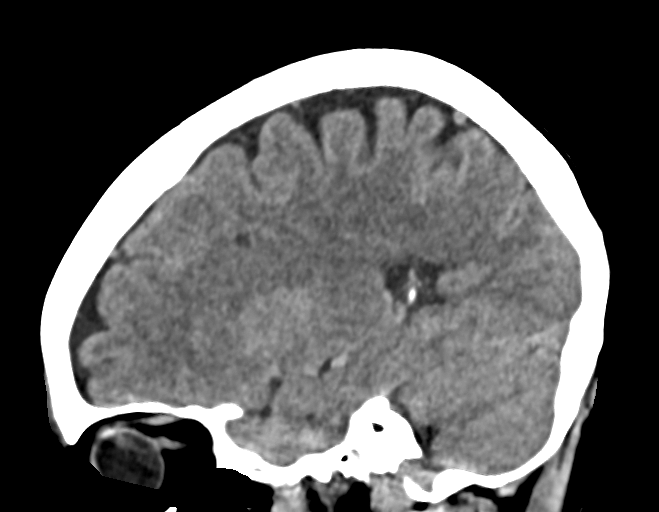
[im 28/55  brain]
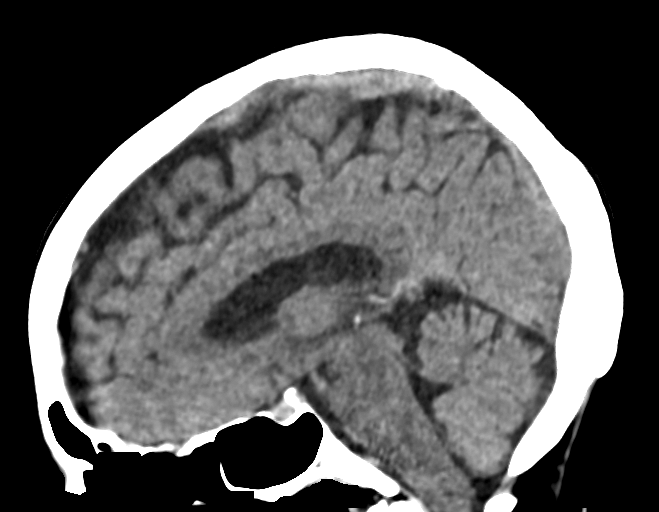
[im 37/55  brain]
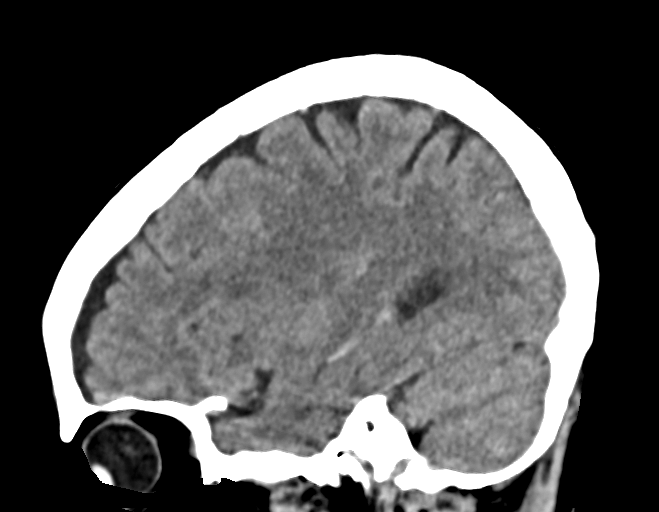

[14 of 47 positions shown; findings below may reference images not displayed]

FINDINGS: Brain: Ventricle size and cerebral volume normal. Mild chronic
appearing changes in the frontal white matter bilaterally.

Negative for acute infarct, hemorrhage, or mass. No fluid collection
or midline shift.

Vascular: Negative for hyperdense vessel

Skull: Negative for skull fracture.

Sinuses/Orbits: Negative

Other: None
IMPRESSION: No acute intracranial abnormality. Mild chronic microvascular
ischemia in the white matter.

## 2021-01-26 ENCOUNTER — Ambulatory Visit: Payer: Medicare HMO | Admitting: Nurse Practitioner

## 2021-01-26 ENCOUNTER — Other Ambulatory Visit: Payer: Self-pay | Admitting: Nurse Practitioner

## 2021-01-26 NOTE — Telephone Encounter (Signed)
Requested medication (s) are due for refill today: yes  Requested medication (s) are on the active medication list: yes  Last refill:  10/27/20  Future visit scheduled: no  Notes to clinic:  med not delegated to NT to RF   Requested Prescriptions  Pending Prescriptions Disp Refills   ALPRAZolam (XANAX) 0.5 MG tablet [Pharmacy Med Name: ALPRAZOLAM 0.5 MG TABLET] 30 tablet 2    Sig: TAKE 1 TABLET BY MOUTH AT BEDTIME.      Not Delegated - Psychiatry:  Anxiolytics/Hypnotics Failed - 01/26/2021  5:09 PM      Failed - This refill cannot be delegated      Passed - Urine Drug Screen completed in last 360 days      Passed - Valid encounter within last 6 months    Recent Outpatient Visits           1 month ago Dizziness   [place], [place] T, NP   3 months ago Long-term current use of benzodiazepine   [place], Barbaraann Faster, NP   6 months ago Primary hypertension   [place], Barbaraann Faster, NP   9 months ago Essential hypertension   [place], [place] T, NP   1 year ago Essential hypertension   [place], Barbaraann Faster, NP       Future Appointments             In 1 month [place], [facility]

## 2021-01-28 NOTE — Telephone Encounter (Signed)
Pt last apt on 12/09/2020 pt wanted to see if she could be able to get a refill with out having an apt as she states she was just here.

## 2021-01-28 NOTE — Telephone Encounter (Signed)
Routing to provider  

## 2021-03-09 ENCOUNTER — Ambulatory Visit (INDEPENDENT_AMBULATORY_CARE_PROVIDER_SITE_OTHER): Payer: Medicare HMO

## 2021-03-09 VITALS — BP 121/78 | HR 72 | Ht 59.0 in | Wt 100.0 lb

## 2021-03-09 DIAGNOSIS — Z0000 Encounter for general adult medical examination without abnormal findings: Secondary | ICD-10-CM

## 2021-03-09 NOTE — Patient Instructions (Signed)
Brandy Chen , Thank you for taking time to come for your Medicare Wellness Visit. I appreciate your ongoing commitment to your health goals. Please review the following plan we discussed and let me know if I can assist you in the future.   Screening recommendations/referrals: Colonoscopy: FOBT 04/10/2020, due 04/10/2021 Mammogram: completed 07/16/2020 Bone Density: completed 02/18/2015 Recommended yearly ophthalmology/optometry visit for glaucoma screening and checkup Recommended yearly dental visit for hygiene and checkup  Vaccinations: Influenza vaccine: completed 07/09/2020, due 04/27/2021 Pneumococcal vaccine: due Tdap vaccine: due Shingles vaccine: discussed   Covid-19: 12/04/2019, 11/02/2019  Advanced directives: Advance directive discussed with you today.   Conditions/risks identified: none  Next appointment: Follow up in one year for your annual wellness visit    Preventive Care 65 Years and Older, Female Preventive care refers to lifestyle choices and visits with your health care provider that can promote health and wellness. What does preventive care include? A yearly physical exam. This is also called an annual well check. Dental exams once or twice a year. Routine eye exams. Ask your health care provider how often you should have your eyes checked. Personal lifestyle choices, including: Daily care of your teeth and gums. Regular physical activity. Eating a healthy diet. Avoiding tobacco and drug use. Limiting alcohol use. Practicing safe sex. Taking low-dose aspirin every day. Taking vitamin and mineral supplements as recommended by your health care provider. What happens during an annual well check? The services and screenings done by your health care provider during your annual well check will depend on your age, overall health, lifestyle risk factors, and family history of disease. Counseling  Your health care provider may ask you questions about your: Alcohol  use. Tobacco use. Drug use. Emotional well-being. Home and relationship well-being. Sexual activity. Eating habits. History of falls. Memory and ability to understand (cognition). Work and work Statistician. Reproductive health. Screening  You may have the following tests or measurements: Height, weight, and BMI. Blood pressure. Lipid and cholesterol levels. These may be checked every 5 years, or more frequently if you are over 38 years old. Skin check. Lung cancer screening. You may have this screening every year starting at age 83 if you have a 30-pack-year history of smoking and currently smoke or have quit within the past 15 years. Fecal occult blood test (FOBT) of the stool. You may have this test every year starting at age 71. Flexible sigmoidoscopy or colonoscopy. You may have a sigmoidoscopy every 5 years or a colonoscopy every 10 years starting at age 50. Hepatitis C blood test. Hepatitis B blood test. Sexually transmitted disease (STD) testing. Diabetes screening. This is done by checking your blood sugar (glucose) after you have not eaten for a while (fasting). You may have this done every 1-3 years. Bone density scan. This is done to screen for osteoporosis. You may have this done starting at age 66. Mammogram. This may be done every 1-2 years. Talk to your health care provider about how often you should have regular mammograms. Talk with your health care provider about your test results, treatment options, and if necessary, the need for more tests. Vaccines  Your health care provider may recommend certain vaccines, such as: Influenza vaccine. This is recommended every year. Tetanus, diphtheria, and acellular pertussis (Tdap, Td) vaccine. You may need a Td booster every 10 years. Zoster vaccine. You may need this after age 84. Pneumococcal 13-valent conjugate (PCV13) vaccine. One dose is recommended after age 86. Pneumococcal polysaccharide (PPSV23) vaccine. One dose is  recommended after age 63. Talk to your health care provider about which screenings and vaccines you need and how often you need them. This information is not intended to replace advice given to you by your health care provider. Make sure you discuss any questions you have with your health care provider. Document Released: 10/10/2015 Document Revised: 06/02/2016 Document Reviewed: 07/15/2015 Elsevier Interactive Patient Education  2017 [place] Prevention in the Home Falls can cause injuries. They can happen to people of all ages. There are many things you can do to make your home safe and to help prevent falls. What can I do on the outside of my home? Regularly fix the edges of walkways and driveways and fix any cracks. Remove anything that might make you trip as you walk through a door, such as a raised step or threshold. Trim any bushes or trees on the path to your home. Use bright outdoor lighting. Clear any walking paths of anything that might make someone trip, such as rocks or tools. Regularly check to see if handrails are loose or broken. Make sure that both sides of any steps have handrails. Any raised decks and porches should have guardrails on the edges. Have any leaves, snow, or ice cleared regularly. Use sand or salt on walking paths during winter. Clean up any spills in your garage right away. This includes oil or grease spills. What can I do in the bathroom? Use night lights. Install grab bars by the toilet and in the tub and shower. Do not use towel bars as grab bars. Use non-skid mats or decals in the tub or shower. If you need to sit down in the shower, use a plastic, non-slip stool. Keep the floor dry. Clean up any water that spills on the floor as soon as it happens. Remove soap buildup in the tub or shower regularly. Attach bath mats securely with double-sided non-slip rug tape. Do not have throw rugs and other things on the floor that can make you  trip. What can I do in the bedroom? Use night lights. Make sure that you have a light by your bed that is easy to reach. Do not use any sheets or blankets that are too big for your bed. They should not hang down onto the floor. Have a firm chair that has side arms. You can use this for support while you get dressed. Do not have throw rugs and other things on the floor that can make you trip. What can I do in the kitchen? Clean up any spills right away. Avoid walking on wet floors. Keep items that you use a lot in easy-to-reach places. If you need to reach something above you, use a strong step stool that has a grab bar. Keep electrical cords out of the way. Do not use floor polish or wax that makes floors slippery. If you must use wax, use non-skid floor wax. Do not have throw rugs and other things on the floor that can make you trip. What can I do with my stairs? Do not leave any items on the stairs. Make sure that there are handrails on both sides of the stairs and use them. Fix handrails that are broken or loose. Make sure that handrails are as long as the stairways. Check any carpeting to make sure that it is firmly attached to the stairs. Fix any carpet that is loose or worn. Avoid having throw rugs at the top or bottom of the stairs. If you  do have throw rugs, attach them to the floor with carpet tape. Make sure that you have a light switch at the top of the stairs and the bottom of the stairs. If you do not have them, ask someone to add them for you. What else can I do to help prevent falls? Wear shoes that: Do not have high heels. Have rubber bottoms. Are comfortable and fit you well. Are closed at the toe. Do not wear sandals. If you use a stepladder: Make sure that it is fully opened. Do not climb a closed stepladder. Make sure that both sides of the stepladder are locked into place. Ask someone to hold it for you, if possible. Clearly mark and make sure that you can  see: Any grab bars or handrails. First and last steps. Where the edge of each step is. Use tools that help you move around (mobility aids) if they are needed. These include: Canes. Walkers. Scooters. Crutches. Turn on the lights when you go into a dark area. Replace any light bulbs as soon as they burn out. Set up your furniture so you have a clear path. Avoid moving your furniture around. If any of your floors are uneven, fix them. If there are any pets around you, be aware of where they are. Review your medicines with your doctor. Some medicines can make you feel dizzy. This can increase your chance of falling. Ask your doctor what other things that you can do to help prevent falls. This information is not intended to replace advice given to you by your health care provider. Make sure you discuss any questions you have with your health care provider. Document Released: 07/10/2009 Document Revised: 02/19/2016 Document Reviewed: 10/18/2014 Elsevier Interactive Patient Education  2017 [employer].

## 2021-03-09 NOTE — Progress Notes (Signed)
I connected with Brandy Chen today by telephone and verified that I am speaking with the correct person using two identifiers. Location patient: home Location provider: work Persons participating in the virtual visit: Drema Eddington, Glenna Durand LPN.   I discussed the limitations, risks, security and privacy concerns of performing an evaluation and management service by telephone and the availability of in person appointments. I also discussed with the patient that there may be a patient responsible charge related to this service. The patient expressed understanding and verbally consented to this telephonic visit.    Interactive audio and video telecommunications were attempted between this provider and patient, however failed, due to patient having technical difficulties OR patient did not have access to video capability.  We continued and completed visit with audio only.     Vital signs may be patient reported or missing.  Subjective:   Brandy Chen is a 70 y.o. female who presents for Medicare Annual (Subsequent) preventive examination.  Review of Systems     Cardiac Risk Factors include: advanced age (>25men, >91 women);hypertension     Objective:    Today's Vitals   03/09/21 1027  BP: 121/78  Pulse: 72  Weight: 100 lb (45.4 kg)  Height: 4\' 11"  (1.499 m)   Body mass index is 20.2 kg/m.  Advanced Directives 03/09/2021 03/05/2020 03/26/2019 03/20/2018 02/17/2017 02/16/2016  Does Patient Have a Medical Advance Directive? No No No No No No  Does patient want to make changes to medical advance directive? - Yes (MAU/Ambulatory/Procedural Areas - Information given) - - - -  Would patient like information on creating a medical advance directive? - - - Yes (MAU/Ambulatory/Procedural Areas - Information given) Yes (MAU/Ambulatory/Procedural Areas - Information given) -    Current Medications (verified) Outpatient Encounter Medications as of 03/09/2021  Medication Sig   ALPRAZolam  (XANAX) 0.5 MG tablet TAKE 1 TABLET BY MOUTH AT BEDTIME.   aspirin EC 81 MG tablet Take 81 mg by mouth.   Calcium Carb-Cholecalciferol 600-800 MG-UNIT TABS Take 1 tablet by mouth 2 (two) times daily.   Krill Oil 300 MG CAPS Take 300 mg by mouth daily.   Magnesium 500 MG TABS Take 500 mg by mouth daily.   meclizine (ANTIVERT) 25 MG tablet Take 1 tablet (25 mg total) by mouth 3 (three) times daily as needed for dizziness.   vitamin C (ASCORBIC ACID) 500 MG tablet Take 500 mg by mouth daily.   Nutritional Supplements (ESTROVEN PO) Take 1 tablet by mouth daily. (Patient not taking: No sig reported)   rosuvastatin (CRESTOR) 5 MG tablet Take 5 MG by mouth 3 days a week (Monday, Wednesday, Friday). (Patient not taking: No sig reported)   No facility-administered encounter medications on file as of 03/09/2021.    Allergies (verified) Sulfa antibiotics   History: Past Medical History:  Diagnosis Date   Anxiety    Depression    Endometriosis    Fatigue    Hyperlipidemia    Hypertension    Insomnia    Lumbago    Osteoporosis    Thyroid disease    Vitamin D deficiency    Past Surgical History:  Procedure Laterality Date   CESAREAN SECTION     pyloric stenosis     TUBAL LIGATION     Family History  Problem Relation Age of Onset   Stroke Mother    Hypertension Mother    Stroke Maternal Grandfather    Hypertension Sister    Social History   Socioeconomic History  Marital status: Married    Spouse name: Not on file   Number of children: Not on file   Years of education: Not on file   Highest education level: High school graduate  Occupational History   Not on file  Tobacco Use   Smoking status: Never   Smokeless tobacco: Never  Vaping Use   Vaping Use: Never used  Substance and Sexual Activity   Alcohol use: No    Alcohol/week: 0.0 standard drinks   Drug use: Yes    Types: Benzodiazepines   Sexual activity: Not Currently  Other Topics Concern   Not on file  Social  History Narrative   Not on file   Social Determinants of Health   Financial Resource Strain: Low Risk    Difficulty of Paying Living Expenses: Not hard at all  Food Insecurity: No Food Insecurity   Worried About Charity fundraiser in the Last Year: Never true   [place] in the Last Year: Never true  Transportation Needs: No Transportation Needs   Lack of Transportation (Medical): No   Lack of Transportation (Non-Medical): No  Physical Activity: Insufficiently Active   Days of Exercise per Week: 4 days   Minutes of Exercise per Session: 20 min  Stress: No Stress Concern Present   Feeling of Stress : Not at all  Social Connections: Not on file    Tobacco Counseling Counseling given: Not Answered   Clinical Intake:  Pre-visit preparation completed: Yes  Pain : No/denies pain     Nutritional Status: BMI of 19-24  Normal Nutritional Risks: None Diabetes: No  How often do you need to have someone help you when you read instructions, pamphlets, or other written materials from your doctor or pharmacy?: 1 - Never What is the last grade level you completed in school?: GED  Diabetic? no  Interpreter Needed?: No  Information entered by :: NAllen LPN   Activities of Daily Living In your present state of health, do you have any difficulty performing the following activities: 03/09/2021  Hearing? N  Vision? N  Difficulty concentrating or making decisions? N  Walking or climbing stairs? N  Dressing or bathing? N  Doing errands, shopping? N  Preparing Food and eating ? N  Using the Toilet? N  In the past six months, have you accidently leaked urine? Y  Do you have problems with loss of bowel control? N  Managing your Medications? N  Managing your Finances? N  Housekeeping or managing your Housekeeping? N  Some recent data might be hidden    Patient Care Team: Venita Lick, NP as PCP - General (Nurse Practitioner)  Indicate any recent Medical Services you  may have received from other than [facility] providers in the past year (date may be approximate).     Assessment:   This is a routine wellness examination for [place].  Hearing/Vision screen Vision Screening - Comments:: Regular eye exams, Dr. Ellin Mayhew  Dietary issues and exercise activities discussed: Current Exercise Habits: Home exercise routine, Type of exercise: strength training/weights, Time (Minutes): 15, Frequency (Times/Week): 4, Weekly Exercise (Minutes/Week): 60   Goals Addressed             This Visit's Progress    Patient Stated       03/09/2021, wants to lower cholesterol        Depression Screen PHQ 2/9 Scores 03/09/2021 10/27/2020 07/09/2020 04/08/2020 03/05/2020 10/08/2019 06/25/2019  PHQ - 2 Score 1 2 0 2 1 2  1  PHQ- 9 Score - 9 1 4  - 6 3    Fall Risk Fall Risk  03/09/2021 10/27/2020 03/05/2020 10/08/2019 03/26/2019  Falls in the past year? 0 0 0 1 1  Number falls in past yr: - - 0 0 0  Injury with Fall? - - 0 1 1  Risk for fall due to : Medication side effect - - - -  Follow up Falls evaluation completed;Education provided;Falls prevention discussed - - - -    FALL RISK PREVENTION PERTAINING TO THE HOME:  Any stairs in or around the home? Yes  If so, are there any without handrails? No  Home free of loose throw rugs in walkways, pet beds, electrical cords, etc? Yes  Adequate lighting in your home to reduce risk of falls? Yes   ASSISTIVE DEVICES UTILIZED TO PREVENT FALLS:  Life alert? No  Use of a cane, walker or w/c? No  Grab bars in the bathroom? No  Shower chair or bench in shower? No  Elevated toilet seat or a handicapped toilet? Yes   TIMED UP AND GO:  Was the test performed? No .      Cognitive Function:     6CIT Screen 03/09/2021 03/26/2019 03/20/2018 02/17/2017  What Year? 0 points 0 points 0 points 0 points  What month? 0 points 0 points 0 points 0 points  What time? 0 points 0 points 0 points 0 points  Count back from 20 0 points 0 points 0  points 0 points  Months in reverse 0 points 0 points 0 points 0 points  Repeat phrase 0 points 0 points 0 points 0 points  Total Score 0 0 0 0    Immunizations Immunization History  Administered Date(s) Administered   Fluad Quad(high Dose 65+) 06/25/2019, 07/09/2020   Influenza, High Dose Seasonal PF 08/23/2016, 08/04/2017, 07/17/2018   Influenza-Unspecified 06/27/2015   Moderna Sars-Covid-2 Vaccination 11/02/2019, 12/04/2019   Pneumococcal Conjugate-13 02/16/2016    TDAP status: Due, Education has been provided regarding the importance of this vaccine. Advised may receive this vaccine at local pharmacy or Health Dept. Aware to provide a copy of the vaccination record if obtained from local pharmacy or Health Dept. Verbalized acceptance and understanding.  Flu Vaccine status: Up to date  Pneumococcal vaccine status: Due, Education has been provided regarding the importance of this vaccine. Advised may receive this vaccine at local pharmacy or Health Dept. Aware to provide a copy of the vaccination record if obtained from local pharmacy or Health Dept. Verbalized acceptance and understanding.  Covid-19 vaccine status: Completed vaccines  Qualifies for Shingles Vaccine? Yes   Zostavax completed No   Shingrix Completed?: No.    Education has been provided regarding the importance of this vaccine. Patient has been advised to call insurance company to determine out of pocket expense if they have not yet received this vaccine. Advised may also receive vaccine at local pharmacy or Health Dept. Verbalized acceptance and understanding.  Screening Tests Health Maintenance  Topic Date Due   TETANUS/TDAP  Never done   Zoster Vaccines- Shingrix (1 of 2) Never done   COVID-19 Vaccine (3 - Moderna risk series) 01/01/2020   PNA vac Low Risk Adult (2 of 2 - PPSV23) 10/27/2021 (Originally 02/15/2017)   COLON CANCER SCREENING ANNUAL FOBT  04/10/2021   INFLUENZA VACCINE  04/27/2021   Fecal DNA  (Cologuard)  05/16/2022   MAMMOGRAM  07/16/2022   DEXA SCAN  Completed   Hepatitis C Screening  Completed  HPV VACCINES  Aged Out    Health Maintenance  Health Maintenance Due  Topic Date Due   TETANUS/TDAP  Never done   Zoster Vaccines- Shingrix (1 of 2) Never done   COVID-19 Vaccine (3 - Moderna risk series) 01/01/2020    Colorectal cancer screening: Type of screening: FOBT/FIT. Completed 04/10/2020. Repeat every 1 years  Mammogram status: Completed 07/16/2020. Repeat every year  Bone Density status: Completed 02/18/2015.   Lung Cancer Screening: (Low Dose CT Chest recommended if Age 10-80 years, 30 pack-year currently smoking OR have quit w/in 15years.) does not qualify.   Lung Cancer Screening Referral: no  Additional Screening:  Hepatitis C Screening: does qualify; Completed 04/03/2018  Vision Screening: Recommended annual ophthalmology exams for early detection of glaucoma and other disorders of the eye. Is the patient up to date with their annual eye exam?  Yes  Who is the provider or what is the name of the office in which the patient attends annual eye exams? Dr. Ellin Mayhew If pt is not established with a provider, would they like to be referred to a provider to establish care? No .   Dental Screening: Recommended annual dental exams for proper oral hygiene  Community Resource Referral / Chronic Care Management: CRR required this visit?  No   CCM required this visit?  No      Plan:     I have personally reviewed and noted the following in the patient's chart:   Medical and social history Use of alcohol, tobacco or illicit drugs  Current medications and supplements including opioid prescriptions.  Functional ability and status Nutritional status Physical activity Advanced directives List of other physicians Hospitalizations, surgeries, and ER visits in previous 12 months Vitals Screenings to include cognitive, depression, and falls Referrals and  appointments  In addition, I have reviewed and discussed with patient certain preventive protocols, quality metrics, and best practice recommendations. A written personalized care plan for preventive services as well as general preventive health recommendations were provided to patient.     Kellie Simmering, LPN   [id number]   Nurse Notes:

## 2021-04-30 ENCOUNTER — Other Ambulatory Visit: Payer: Self-pay | Admitting: Nurse Practitioner

## 2021-04-30 NOTE — Telephone Encounter (Signed)
Requested medications are due for refill today yes  Requested medications are on the active medication list yes  Last refill 7/4  Last visit 11/2020  Future visit scheduled 02/2022  Notes to clinic Not Delegated.

## 2021-05-01 NOTE — Telephone Encounter (Signed)
Patient last seen in January for routine f/up. Was due in 3 months from that appointment. Please call and schedule appointment.

## 2021-05-01 NOTE — Telephone Encounter (Signed)
Needs an appointment for refill. Was supposed to follow up in April 2022. Admin pool aware to call and schedule her for an appointment.

## 2021-05-09 ENCOUNTER — Other Ambulatory Visit: Payer: Self-pay | Admitting: Nurse Practitioner

## 2021-05-09 NOTE — Telephone Encounter (Signed)
Requested medication (s) are due for refill today: yes  Requested medication (s) are on the active medication list: yes  Last refill:  02/07/21 #30  Future visit scheduled: yes  Notes to clinic:  pt has an upcoming appt Med not delegated to NT to RF   Requested Prescriptions  Pending Prescriptions Disp Refills   ALPRAZolam (XANAX) 0.5 MG tablet [Pharmacy Med Name: ALPRAZOLAM 0.5 MG TABLET] 30 tablet 2    Sig: TAKE 1 TABLET BY MOUTH EVERYDAY AT BEDTIME     Not Delegated - Psychiatry:  Anxiolytics/Hypnotics Failed - 05/09/2021  3:53 PM      Failed - This refill cannot be delegated      Passed - Urine Drug Screen completed in last 360 days      Passed - Valid encounter within last 6 months    Recent Outpatient Visits           5 months ago Dizziness   [place], Henrine Screws T, NP   6 months ago Long-term current use of benzodiazepine   [place], Barbaraann Faster, NP   10 months ago Primary hypertension   [place], [place] T, NP   1 year ago Essential hypertension   [place], Wall T, NP   1 year ago Essential hypertension   [place], Barbaraann Faster, NP       Future Appointments             In 2 weeks [place], Barbaraann Faster, NP [facility]   In 10 months  [facility]

## 2021-05-11 NOTE — Telephone Encounter (Signed)
Patient overdue for mood follow up. Please call and schedule follow up.

## 2021-05-27 ENCOUNTER — Ambulatory Visit: Payer: Medicare HMO | Admitting: Nurse Practitioner

## 2021-06-18 ENCOUNTER — Encounter: Payer: Self-pay | Admitting: Nurse Practitioner

## 2021-06-18 ENCOUNTER — Other Ambulatory Visit: Payer: Self-pay

## 2021-06-18 ENCOUNTER — Ambulatory Visit (INDEPENDENT_AMBULATORY_CARE_PROVIDER_SITE_OTHER): Payer: Medicare HMO | Admitting: Nurse Practitioner

## 2021-06-18 VITALS — BP 138/73 | HR 64 | Temp 98.4°F | Wt 96.6 lb

## 2021-06-18 DIAGNOSIS — E7800 Pure hypercholesterolemia, unspecified: Secondary | ICD-10-CM | POA: Diagnosis not present

## 2021-06-18 DIAGNOSIS — Z79899 Other long term (current) drug therapy: Secondary | ICD-10-CM | POA: Diagnosis not present

## 2021-06-18 DIAGNOSIS — F419 Anxiety disorder, unspecified: Secondary | ICD-10-CM

## 2021-06-18 DIAGNOSIS — I10 Essential (primary) hypertension: Secondary | ICD-10-CM

## 2021-06-18 DIAGNOSIS — H6981 Other specified disorders of Eustachian tube, right ear: Secondary | ICD-10-CM | POA: Diagnosis not present

## 2021-06-18 DIAGNOSIS — E038 Other specified hypothyroidism: Secondary | ICD-10-CM | POA: Diagnosis not present

## 2021-06-18 MED ORDER — PREDNISONE 20 MG PO TABS: 40.0000 mg | ORAL_TABLET | Freq: Every day | ORAL | 0 refills | Status: AC

## 2021-06-18 MED ORDER — BUSPIRONE HCL 5 MG PO TABS: 5.0000 mg | ORAL_TABLET | Freq: Two times a day (BID) | ORAL | 4 refills | Status: DC

## 2021-06-18 NOTE — Progress Notes (Signed)
BP 138/73   Pulse 64   Temp 98.4 F (36.9 C) (Oral)   Wt 96 lb 9.6 oz (43.8 kg)   SpO2 97%   BMI 19.51 kg/m    Subjective:    Patient ID: Brandy Chen, female    DOB: 05/27/1951, 70 y.o.   MRN: [id number]  HPI: Brandy Chen is a 70 y.o. female  Chief Complaint  Patient presents with   Medication Management    Patient is here to follow up on medication management. Patient denies having any concerns at today's visit.    Ear Problem    Patient states she would like for the provider to check her ear as she feels like she may have fluids behind her R ear. Patient first noticed it Friday and feels like something is rattling around in there.    HYPERLIPIDEMIA Never took Crestor, was worried about side effects. Hyperlipidemia status: good compliance Satisfied with current treatment?  yes Supplements: fish oil Aspirin:  no The 10-year ASCVD risk score (Arnett DK, et al., 2019) is: 13.4%   Values used to calculate the score:     Age: 70 years     Sex: Female     Is Non-Hispanic African American: No     Diabetic: No     Tobacco smoker: No     Systolic Blood Pressure: 517 mmHg     Is BP treated: No     HDL Cholesterol: 75 mg/dL     Total Cholesterol: 233 mg/dL Chest pain:  no Coronary artery disease:  no Family history CAD:  yes Family history early CAD:  no   ANXIETY/STRESS Has been on Xanax at bedtime for long period, years.  She has tried Sertraline, but reports this was not successful + multiple other medications.  Wishes to trial Buspar.   Pt is aware of risks of benzo medication use to include increased sedation, respiratory suppression, falls, dependence and cardiovascular events.  Pt would like to continue treatment as benefit. Currently she is taking 1/2 tablet Xanax during day and 1/2 at night. This works for her per her report and helps her mood.  Again reviewed BEERS criteria with her. PDMP last fill 06/11/21. Duration: stable Anxious mood: yes Excessive worrying:  yes Irritability: no  Sweating: no Nausea: no Palpitations:no Hyperventilation: no Panic attacks: no Agoraphobia: no  Obscessions/compulsions: no Depressed mood: a little bit Depression screen [facility] 2/9 06/18/2021 03/09/2021 10/27/2020 07/09/2020 04/08/2020  Decreased Interest 0 0 1 0 1  Down, Depressed, Hopeless 1 1 1  0 1  PHQ - 2 Score 1 1 2  0 2  Altered sleeping 2 - 0 1 0  Tired, decreased energy 0 - 1 0 1  Change in appetite 0 - 2 0 1  Feeling bad or failure about yourself  0 - 3 0 0  Trouble concentrating 0 - 1 0 0  Moving slowly or fidgety/restless 0 - 0 0 0  Suicidal thoughts 0 - 0 0 0  PHQ-9 Score 3 - 9 1 4   Difficult doing work/chores Not difficult at all - - Not difficult at all Not difficult at all  Some recent data might be hidden   Anhedonia: no Weight changes: no Insomnia: yes hard to fall asleep --- Xanax helps Hypersomnia: no Fatigue/loss of energy: no Feelings of worthlessness: no Feelings of guilt: no Impaired concentration/indecisiveness: no Suicidal ideations: no  Crying spells: no Recent Stressors/Life Changes: yes -- Covid   Relationship problems: no   Family stress:  no     Financial stress: no    Job stress: no    Recent death/loss: no GAD 7 : Generalized Anxiety Score 06/18/2021 10/27/2020 07/09/2020 04/08/2020  Nervous, Anxious, on Edge 1 3 0 1  Control/stop worrying 1 1 0 0  Worry too much - different things 1 1 0 0  Trouble relaxing 1 1 0 1  Restless 1 0 0 0  Easily annoyed or irritable 1 1 0 1  Afraid - awful might happen 1 1 0 0  Total GAD 7 Score 7 8 0 3  Anxiety Difficulty Somewhat difficult Somewhat difficult - Not difficult at all   EAR DISCOMFORT Having feeling of fluid and wooshing to right ear.   Duration: weeks Involved ear(s): right Fever: no Otorrhea: no Upper respiratory infection symptoms: no Pruritus: yes Hearing loss: no Water immersion no Using Q-tips: no Recurrent otitis media: no Status: fluctuating Treatments  attempted: none   Relevant past medical, surgical, family and social history reviewed and updated as indicated. Interim medical history since our last visit reviewed. Allergies and medications reviewed and updated.  Review of Systems  Constitutional:  Negative for activity change, appetite change, diaphoresis, fatigue and fever.  Respiratory:  Negative for cough, chest tightness and shortness of breath.   Cardiovascular:  Negative for chest pain, palpitations and leg swelling.  Gastrointestinal:  Negative for abdominal distention, abdominal pain, constipation, diarrhea, nausea and vomiting.  Neurological:  Negative for dizziness, syncope, weakness, light-headedness, numbness and headaches.  Psychiatric/Behavioral: Negative.     Per HPI unless specifically indicated above     Objective:    BP 138/73   Pulse 64   Temp 98.4 F (36.9 C) (Oral)   Wt 96 lb 9.6 oz (43.8 kg)   SpO2 97%   BMI 19.51 kg/m   Wt Readings from Last 3 Encounters:  06/18/21 96 lb 9.6 oz (43.8 kg)  03/09/21 100 lb (45.4 kg)  12/09/20 98 lb 12.8 oz (44.8 kg)    Physical Exam Vitals and nursing note reviewed.  Constitutional:      General: She is awake. She is not in acute distress.    Appearance: She is well-developed. She is not ill-appearing.  HENT:     Head: Normocephalic.     Right Ear: Hearing, ear canal and external ear normal. A middle ear effusion is present.     Left Ear: Hearing, tympanic membrane, ear canal and external ear normal.  Eyes:     General: Lids are normal.        Right eye: No discharge.        Left eye: No discharge.     Conjunctiva/sclera: Conjunctivae normal.     Pupils: Pupils are equal, round, and reactive to light.  Neck:     Thyroid: No thyromegaly.     Vascular: No carotid bruit.  Cardiovascular:     Rate and Rhythm: Normal rate and regular rhythm.     Heart sounds: Normal heart sounds. No murmur heard.   No gallop.  Pulmonary:     Effort: Pulmonary effort is normal.  No accessory muscle usage or respiratory distress.     Breath sounds: Normal breath sounds.  Abdominal:     General: Bowel sounds are normal.     Palpations: Abdomen is soft.  Musculoskeletal:     Cervical back: Normal range of motion and neck supple.     Right lower leg: No edema.     Left lower leg: No edema.  Skin:  General: Skin is warm and dry.  Neurological:     Mental Status: She is alert and oriented to person, place, and time.  Psychiatric:        Attention and Perception: Attention normal.        Mood and Affect: Mood normal.        Behavior: Behavior normal. Behavior is cooperative.        Thought Content: Thought content normal.        Judgment: Judgment normal.   Results for orders placed or performed in visit on 10/27/20  [id number] 11+Oxyco+Alc+Crt-Bund  Result Value Ref Range   Ethanol Negative Cutoff=0.020 %   Amphetamines, Urine Negative Cutoff=1000 ng/mL   Barbiturate Negative Cutoff=200 ng/mL   BENZODIAZ UR QL See Final Results Cutoff=200 ng/mL   Cannabinoid Quant, Ur Negative Cutoff=50 ng/mL   Cocaine (Metabolite) Negative Cutoff=300 ng/mL   OPIATE SCREEN URINE Negative Cutoff=300 ng/mL   Oxycodone/Oxymorphone, Urine Negative Cutoff=300 ng/mL   Phencyclidine Negative Cutoff=25 ng/mL   Methadone Screen, Urine Negative Cutoff=300 ng/mL   Propoxyphene Negative Cutoff=300 ng/mL   Meperidine Negative Cutoff=200 ng/mL   Tramadol Negative Cutoff=200 ng/mL   Creatinine 59.7 20.0 - 300.0 mg/dL   pH, Urine 5.0 4.5 - 8.9  Benzodiazepines Confirm, Urine  Result Value Ref Range   Benzodiazepines Positive (A) Cutoff=100 ng/mL   Nordiazepam Negative Cutoff=100   Oxazepam Negative Cutoff=100   Flurazepam Negative Cutoff=100   Lorazepam Negative Cutoff=100   Alprazolam Positive (A)    Alprazolam Conf. 106 Cutoff=100 ng/mL   Clonazepam Negative Cutoff=100   Temazepam Negative Cutoff=100   Triazolam Negative Cutoff=100   Midazolam Negative Cutoff=100       Assessment & Plan:   Problem List Items Addressed This Visit       Cardiovascular and Mediastinum   Hypertension - Primary    Chronic, stable.  BP at goal in office and on home readings.  Continue current diet control.  Consider Lisinopril if elevation in BP >130/80 consistently at home and office.  Labs today.      Relevant Orders   CBC with Differential/Platelet   TSH     Endocrine   Subclinical hypothyroidism    Subclinical at baseline.  Labs today, does not wish to start medication at this time, but will consider dependent on labs.        Nervous and Auditory   Eustachian tube dysfunction, right    Acute and noted on exam, educated patient on findings.  Will send in Prednisone 40 MG daily x 5 days to treat.  If ongoing issues return to office.        Other   Hypercholesteremia    Chronic, ongoing.  She never took Rosuvastatin ordered last visit due to concerns about side effects.  Obtain lipid today and continue to recommend diet focus + highly recommend statin therapy.      Relevant Orders   Comprehensive metabolic panel   Lipid Panel w/o Chol/HDL Ratio   Chronic anxiety    Chronic, ongoing for years and stable.  Denies SI/HI.  Has been on Xanax for several years.  Refills sent in August  #30 x 2.  Return in 3 months.  She agrees to trial of Buspar today, will send this in.  UDS up to date -- due next 10/27/21.  Has contract on file.      Relevant Medications   busPIRone (BUSPAR) 5 MG tablet   Long-term current use of benzodiazepine    Check UDS due next  10/27/21 and continued ongoing discussions about risks with long term benzo use.  She agrees to trial of Buspar today, script sent.        Follow up plan: Return in about 3 months (around 09/17/2021) for ANXIETY.

## 2021-06-18 NOTE — Assessment & Plan Note (Addendum)
Chronic, ongoing.  She never took Rosuvastatin ordered last visit due to concerns about side effects.  Obtain lipid today and continue to recommend diet focus + highly recommend statin therapy.

## 2021-06-18 NOTE — Assessment & Plan Note (Signed)
Chronic, stable.  BP at goal in office and on home readings.  Continue current diet control.  Consider Lisinopril if elevation in BP >130/80 consistently at home and office.  Labs today.

## 2021-06-18 NOTE — Assessment & Plan Note (Signed)
Check UDS due next 10/27/21 and continued ongoing discussions about risks with long term benzo use.  She agrees to trial of Buspar today, script sent.

## 2021-06-18 NOTE — Assessment & Plan Note (Signed)
Subclinical at baseline.  Labs today, does not wish to start medication at this time, but will consider dependent on labs.

## 2021-06-18 NOTE — Assessment & Plan Note (Addendum)
Chronic, ongoing for years and stable.  Denies SI/HI.  Has been on Xanax for several years.  Refills sent in August  #30 x 2.  Return in 3 months.  She agrees to trial of Buspar today, will send this in.  UDS up to date -- due next 10/27/21.  Has contract on file.

## 2021-06-18 NOTE — Assessment & Plan Note (Signed)
Acute and noted on exam, educated patient on findings.  Will send in Prednisone 40 MG daily x 5 days to treat.  If ongoing issues return to office.

## 2021-06-18 NOTE — Patient Instructions (Signed)

## 2021-06-19 LAB — CBC WITH DIFFERENTIAL/PLATELET
Basophils Absolute: 0.1 10*3/uL (ref 0.0–0.2)
Basos: 1 %
EOS (ABSOLUTE): 0.1 10*3/uL (ref 0.0–0.4)
Eos: 2 %
Hematocrit: 43.3 % (ref 34.0–46.6)
Hemoglobin: 14 g/dL (ref 11.1–15.9)
Immature Grans (Abs): 0 10*3/uL (ref 0.0–0.1)
Immature Granulocytes: 0 %
Lymphocytes Absolute: 2.3 10*3/uL (ref 0.7–3.1)
Lymphs: 33 %
MCH: 30.4 pg (ref 26.6–33.0)
MCHC: 32.3 g/dL (ref 31.5–35.7)
MCV: 94 fL (ref 79–97)
Monocytes Absolute: 0.4 10*3/uL (ref 0.1–0.9)
Monocytes: 6 %
Neutrophils Absolute: 4 10*3/uL (ref 1.4–7.0)
Neutrophils: 58 %
Platelets: 273 10*3/uL (ref 150–450)
RBC: 4.61 x10E6/uL (ref 3.77–5.28)
RDW: 13.1 % (ref 11.7–15.4)
WBC: 6.8 10*3/uL (ref 3.4–10.8)

## 2021-06-19 LAB — COMPREHENSIVE METABOLIC PANEL
ALT: 14 IU/L (ref 0–32)
AST: 22 IU/L (ref 0–40)
Albumin/Globulin Ratio: 1.9 (ref 1.2–2.2)
Albumin: 4.3 g/dL (ref 3.7–4.7)
Alkaline Phosphatase: 85 IU/L (ref 44–121)
BUN/Creatinine Ratio: 15 (ref 12–28)
BUN: 11 mg/dL (ref 8–27)
Bilirubin Total: 0.5 mg/dL (ref 0.0–1.2)
CO2: 24 mmol/L (ref 20–29)
Calcium: 9.4 mg/dL (ref 8.7–10.3)
Chloride: 104 mmol/L (ref 96–106)
Creatinine, Ser: 0.75 mg/dL (ref 0.57–1.00)
Globulin, Total: 2.3 g/dL (ref 1.5–4.5)
Glucose: 93 mg/dL (ref 65–99)
Potassium: 4.3 mmol/L (ref 3.5–5.2)
Sodium: 143 mmol/L (ref 134–144)
Total Protein: 6.6 g/dL (ref 6.0–8.5)
eGFR: 85 mL/min/{1.73_m2} (ref >59)

## 2021-06-19 LAB — LIPID PANEL W/O CHOL/HDL RATIO
Cholesterol, Total: 224 mg/dL — ABNORMAL HIGH (ref 100–199)
HDL: 76 mg/dL (ref >39)
LDL Chol Calc (NIH): 131 mg/dL — ABNORMAL HIGH (ref 0–99)
Triglycerides: 100 mg/dL (ref 0–149)
VLDL Cholesterol Cal: 17 mg/dL (ref 5–40)

## 2021-06-19 LAB — TSH: TSH: 5.24 u[IU]/mL — ABNORMAL HIGH (ref 0.450–4.500)

## 2021-06-19 NOTE — Progress Notes (Signed)
Good morning, please let Kristyana know her labs have returned.  Overall they look good with exception of cholesterol levels which remain elevated, I know you are not interested in medication -- please continue fish oil and diet focus.  Thyroid level was mildly elevated this check, we will recheck next visit to determine if any need for medication.  Keep up the great work!!  Any questions? Keep being awesome!!  Thank you for allowing me to participate in your care.  I appreciate you. Kindest regards, Jelitza Manninen

## 2021-07-15 ENCOUNTER — Other Ambulatory Visit: Payer: Self-pay

## 2021-07-15 ENCOUNTER — Ambulatory Visit (INDEPENDENT_AMBULATORY_CARE_PROVIDER_SITE_OTHER): Payer: Medicare HMO

## 2021-07-15 DIAGNOSIS — Z23 Encounter for immunization: Secondary | ICD-10-CM | POA: Diagnosis not present

## 2021-08-11 ENCOUNTER — Other Ambulatory Visit: Payer: Self-pay | Admitting: Nurse Practitioner

## 2021-08-11 NOTE — Telephone Encounter (Signed)
Requested medication (s) are due for refill today - yes  Requested medication (s) are on the active medication list -yes  Future visit scheduled -yes  Last refill: 05/11/21 #30 2 RF  Notes to clinic: Request RF: non delegated Rx  Requested Prescriptions  Pending Prescriptions Disp Refills   ALPRAZolam (XANAX) 0.5 MG tablet [Pharmacy Med Name: ALPRAZOLAM 0.5 MG TABLET] 30 tablet 2    Sig: TAKE 1 TABLET BY MOUTH EVERYDAY AT BEDTIME     Not Delegated - Psychiatry:  Anxiolytics/Hypnotics Failed - 08/11/2021 10:55 AM      Failed - This refill cannot be delegated      Passed - Urine Drug Screen completed in last 360 days      Passed - Valid encounter within last 6 months    Recent Outpatient Visits           1 month ago Primary hypertension   [place], Barbaraann Faster, NP   8 months ago Dizziness   [place], [place] T, NP   9 months ago Long-term current use of benzodiazepine   [place], Joffre T, NP   1 year ago Primary hypertension   [place], [place] T, NP   1 year ago Essential hypertension   [place], Barbaraann Faster, NP       Future Appointments             In 1 month [place], Barbaraann Faster, NP [facility]   In 7 months  [facility]               Requested Prescriptions  Pending Prescriptions Disp Refills   ALPRAZolam (XANAX) 0.5 MG tablet [Pharmacy Med Name: ALPRAZOLAM 0.5 MG TABLET] 30 tablet 2    Sig: TAKE 1 TABLET BY MOUTH EVERYDAY AT BEDTIME     Not Delegated - Psychiatry:  Anxiolytics/Hypnotics Failed - 08/11/2021 10:55 AM      Failed - This refill cannot be delegated      Passed - Urine Drug Screen completed in last 360 days      Passed - Valid encounter within last 6 months    Recent Outpatient Visits           1 month ago Primary hypertension   [place], Barbaraann Faster, NP   8 months ago  Dizziness   [place], [place] T, NP   9 months ago Long-term current use of benzodiazepine   [place], [place] T, NP   1 year ago Primary hypertension   [place], [place] T, NP   1 year ago Essential hypertension   [place], Barbaraann Faster, NP       Future Appointments             In 1 month Cann[place], Barbaraann Faster, NP [facility]   In 7 months  [facility]

## 2021-08-12 ENCOUNTER — Ambulatory Visit: Payer: Medicare HMO

## 2021-08-24 DIAGNOSIS — H43812 Vitreous degeneration, left eye: Secondary | ICD-10-CM | POA: Diagnosis not present

## 2021-08-24 DIAGNOSIS — H2513 Age-related nuclear cataract, bilateral: Secondary | ICD-10-CM | POA: Diagnosis not present

## 2021-09-17 ENCOUNTER — Ambulatory Visit: Payer: Medicare HMO | Admitting: Nurse Practitioner

## 2021-09-17 LAB — FECAL OCCULT BLOOD, IMMUNOCHEMICAL: IFOBT: NEGATIVE

## 2021-10-28 ENCOUNTER — Telehealth: Payer: Self-pay | Admitting: Nurse Practitioner

## 2021-10-28 NOTE — Telephone Encounter (Signed)
Appt rescheduled

## 2021-10-28 NOTE — Telephone Encounter (Signed)
Pt calkked in fr AWV to be rescheduled.

## 2021-10-29 ENCOUNTER — Encounter: Payer: Self-pay | Admitting: Nurse Practitioner

## 2021-10-29 ENCOUNTER — Other Ambulatory Visit: Payer: Self-pay

## 2021-10-29 ENCOUNTER — Ambulatory Visit (INDEPENDENT_AMBULATORY_CARE_PROVIDER_SITE_OTHER): Payer: Medicare HMO | Admitting: Nurse Practitioner

## 2021-10-29 VITALS — BP 136/78 | HR 64 | Temp 97.6°F | Ht 59.0 in | Wt 95.4 lb

## 2021-10-29 DIAGNOSIS — E559 Vitamin D deficiency, unspecified: Secondary | ICD-10-CM

## 2021-10-29 DIAGNOSIS — Z79899 Other long term (current) drug therapy: Secondary | ICD-10-CM

## 2021-10-29 DIAGNOSIS — H938X3 Other specified disorders of ear, bilateral: Secondary | ICD-10-CM | POA: Diagnosis not present

## 2021-10-29 DIAGNOSIS — M79641 Pain in right hand: Secondary | ICD-10-CM | POA: Diagnosis not present

## 2021-10-29 DIAGNOSIS — I10 Essential (primary) hypertension: Secondary | ICD-10-CM

## 2021-10-29 DIAGNOSIS — E038 Other specified hypothyroidism: Secondary | ICD-10-CM | POA: Diagnosis not present

## 2021-10-29 DIAGNOSIS — M810 Age-related osteoporosis without current pathological fracture: Secondary | ICD-10-CM | POA: Diagnosis not present

## 2021-10-29 DIAGNOSIS — Z1231 Encounter for screening mammogram for malignant neoplasm of breast: Secondary | ICD-10-CM

## 2021-10-29 DIAGNOSIS — E7800 Pure hypercholesterolemia, unspecified: Secondary | ICD-10-CM

## 2021-10-29 DIAGNOSIS — F419 Anxiety disorder, unspecified: Secondary | ICD-10-CM

## 2021-10-29 DIAGNOSIS — M25551 Pain in right hip: Secondary | ICD-10-CM | POA: Insufficient documentation

## 2021-10-29 DIAGNOSIS — R0789 Other chest pain: Secondary | ICD-10-CM | POA: Diagnosis not present

## 2021-10-29 MED ORDER — ALPRAZOLAM 0.5 MG PO TABS: ORAL_TABLET | ORAL | 2 refills | Status: DC

## 2021-10-29 NOTE — Assessment & Plan Note (Signed)
Does not wish medication for osteoporosis.  Continue Vit D and calcium at home.  Check Vit D today.  Discussed at length provider concern for fracture with this patient, due to having dogs and frequent injuries.  She will consider going for repeat DEXA and discuss further after this.

## 2021-10-29 NOTE — Assessment & Plan Note (Signed)
Refer to chronic anxiety plan of care. 

## 2021-10-29 NOTE — Assessment & Plan Note (Signed)
Chronic, stable.  BP at goal in office on recheck and on home readings when performs.  Continue current diet control.  Consider Lisinopril if elevation in BP >130/80 consistently at home and office.  DASH Diet focus recommended.  LABS: BMP, TSH today.

## 2021-10-29 NOTE — Assessment & Plan Note (Signed)
Subclinical at baseline.  Labs today, does not wish to start medication at this time, but will consider dependent on labs.

## 2021-10-29 NOTE — Progress Notes (Signed)
BP 136/78    Pulse 64    Temp 97.6 F (36.4 C) (Oral)    Ht 4\' 11"  (1.499 m)    Wt 95 lb 6.4 oz (43.3 kg)    SpO2 97%    BMI 19.27 kg/m    Subjective:    Patient ID: Brandy Chen, female    DOB: [birth year], 71 y.o.   MRN: [id number]  HPI: Brandy Chen is a 71 y.o. female  Chief Complaint  Patient presents with   Anxiety   Fall    Patient states she tripped over her dog a couple of months ago and since the fall she notices she has some soreness in her right hip. Patient states she has a hard time getting up and when she does finally get up she does well.    Hand Injury    Patient states she was playing with tug a war with her dog and she notices a pop in her right hand and she says now when she moves her hand she notices a pop in her knuckle area.    HYPERLIPIDEMIA Ordered Crestor in past, but did not take.  Recently had some chest pain last week, was doing some working out with arm bands and noticed this 1-2 days later.  Pain was to shoulders and chest mid line -- had no SOB or diaphoresis, N&V, headaches, dizziness.  This pain came and went for a few days, then stopped.  She did take some ASA which helped. Hyperlipidemia status: good compliance Satisfied with current treatment?  yes Supplements: fish oil Aspirin:  no The 10-year ASCVD risk score (Arnett DK, et al., 2019) is: 13%   Values used to calculate the score:     Age: 65 years     Sex: Female     Is Non-Hispanic African American: No     Diabetic: No     Tobacco smoker: No     Systolic Blood Pressure: 419 mmHg     Is BP treated: No     HDL Cholesterol: 76 mg/dL     Total Cholesterol: 224 mg/dL Chest pain:  no Coronary artery disease:  no Family history CAD:  yes Family history early CAD:  no   HIP PAIN Fell over dog about 2 months ago and landed on right hip --did not see provider or UC after this.  Got on up and moved around.  Still having discomfort to right hip.  Reports she was also playing with a tug of war  rope with dog recently and feels it knocked knuckle out of joint two months ago -- reports this does not hurt, but it is uncomfortable. Duration: months Involved hip: right  Mechanism of injury: trauma Location: lateral Onset: gradual  Severity: 5/10  Quality: dull and aching Frequency: intermittent Radiation: no Aggravating factors: weight bearing, walking, and movement   Alleviating factors: NSAIDs  Status: stable Treatments attempted: ice, heat, APAP, and ibuprofen   Relief with NSAIDs?: moderate Weakness with weight bearing: no Weakness with walking: no Paresthesias / decreased sensation: no Swelling: no Redness:no Fevers: no   OSTEOPOROSIS Noted on 2016 DEXA, has not returned for repeat imaging.  Does not want to take medications. Adequate calcium & vitamin D: yes Weight bearing exercises: yes   ANXIETY/STRESS Has been on Xanax at bedtime for long period, years.  She has tried Sertraline, but reports this was not successful + multiple other medications.  She tried Buspar, but this caused vertigo.  Pt is  aware of risks of benzo medication use to include increased sedation, respiratory suppression, falls, dependence and cardiovascular events.  Pt would like to continue treatment as benefit. Currently she is taking 1/2 tablet Xanax during day and 1/2 at night. This works for her per her report and helps her mood.  Again reviewed BEERS criteria with her. PDMP last fill 10/11/21.   Duration: stable Anxious mood: yes Excessive worrying: yes Irritability: no  Sweating: no Nausea: no Palpitations:no Hyperventilation: no Panic attacks: no Agoraphobia: no  Obscessions/compulsions: no Depressed mood: a little bit Depression screen [facility] 2/9 10/29/2021 06/18/2021 03/09/2021 10/27/2020 07/09/2020  Decreased Interest 1 0 0 1 0  Down, Depressed, Hopeless 1 1 1 1  0  PHQ - 2 Score 2 1 1 2  0  Altered sleeping 1 2 - 0 1  Tired, decreased energy 1 0 - 1 0  Change in appetite 1 0 - 2 0   Feeling bad or failure about yourself  1 0 - 3 0  Trouble concentrating 0 0 - 1 0  Moving slowly or fidgety/restless 0 0 - 0 0  Suicidal thoughts 0 0 - 0 0  PHQ-9 Score 6 3 - 9 1  Difficult doing work/chores - Not difficult at all - - Not difficult at all  Some recent data might be hidden  Anhedonia: no Weight changes: no Insomnia: yes hard to fall asleep --- Xanax helps Hypersomnia: no Fatigue/loss of energy: no Feelings of worthlessness: no Feelings of guilt: no Impaired concentration/indecisiveness: no Suicidal ideations: no  Crying spells: no Recent Stressors/Life Changes: yes -- Covid   Relationship problems: no   Family stress: no     Financial stress: no    Job stress: no    Recent death/loss: no GAD 7 : Generalized Anxiety Score 10/29/2021 06/18/2021 10/27/2020 07/09/2020  Nervous, Anxious, on Edge 1 1 3  0  Control/stop worrying 0 1 1 0  Worry too much - different things 1 1 1  0  Trouble relaxing 1 1 1  0  Restless 0 1 0 0  Easily annoyed or irritable 1 1 1  0  Afraid - awful might happen 0 1 1 0  Total GAD 7 Score 4 7 8  0  Anxiety Difficulty Not difficult at all Somewhat difficult Somewhat difficult -   EAR DISCOMFORT Itching to ears bilaterally.  Having occasional nose bleeds -- uses Flonase and nasal sprays. Duration: weeks Involved ear(s): right Fever: no Otorrhea: no Upper respiratory infection symptoms: no Pruritus: yes Hearing loss: no Water immersion no Using Q-tips: no Recurrent otitis media: no Status: fluctuating Treatments attempted: none   Relevant past medical, surgical, family and social history reviewed and updated as indicated. Interim medical history since our last visit reviewed. Allergies and medications reviewed and updated.  Review of Systems  Constitutional:  Negative for activity change, appetite change, diaphoresis, fatigue and fever.  Respiratory:  Negative for cough, chest tightness and shortness of breath.   Cardiovascular:   Negative for chest pain, palpitations and leg swelling.  Gastrointestinal:  Negative for abdominal distention, abdominal pain, constipation, diarrhea, nausea and vomiting.  Neurological:  Negative for dizziness, syncope, weakness, light-headedness, numbness and headaches.  Psychiatric/Behavioral: Negative.     Per HPI unless specifically indicated above     Objective:    BP 136/78    Pulse 64    Temp 97.6 F (36.4 C) (Oral)    Ht 4\' 11"  (1.499 m)    Wt 95 lb 6.4 oz (43.3 kg)    SpO2  97%    BMI 19.27 kg/m   Wt Readings from Last 3 Encounters:  10/29/21 95 lb 6.4 oz (43.3 kg)  06/18/21 96 lb 9.6 oz (43.8 kg)  03/09/21 100 lb (45.4 kg)    Physical Exam Vitals and nursing note reviewed.  Constitutional:      General: She is awake. She is not in acute distress.    Appearance: She is well-developed. She is not ill-appearing.  HENT:     Head: Normocephalic.     Right Ear: Hearing, tympanic membrane, ear canal and external ear normal. No swelling. No middle ear effusion.     Left Ear: Hearing, tympanic membrane, ear canal and external ear normal. No swelling.  No middle ear effusion.     Ears:     Comments: Cerumen moderate to left ear, she reports she will clean at home with Debrox. Eyes:     General: Lids are normal.        Right eye: No discharge.        Left eye: No discharge.     Conjunctiva/sclera: Conjunctivae normal.     Pupils: Pupils are equal, round, and reactive to light.  Neck:     Thyroid: No thyromegaly.     Vascular: No carotid bruit.  Cardiovascular:     Rate and Rhythm: Normal rate and regular rhythm.     Heart sounds: Normal heart sounds. No murmur heard.   No gallop.  Pulmonary:     Effort: Pulmonary effort is normal. No accessory muscle usage or respiratory distress.     Breath sounds: Normal breath sounds.  Abdominal:     General: Bowel sounds are normal.     Palpations: Abdomen is soft.  Musculoskeletal:     Right hand: No swelling, deformity,  lacerations or tenderness. Normal range of motion. Normal strength. Normal sensation. Normal pulse.     Left hand: Normal.     Cervical back: Normal range of motion and neck supple.     Right hip: Tenderness (to posterior gluteus mid section area) present. No deformity, bony tenderness or crepitus. Normal range of motion. Normal strength.     Left hip: Normal.     Right lower leg: No edema.     Left lower leg: No edema.     Comments: Popping noted on occasion to 4th and 5th finger right hand.  No tenderness.  Skin:    General: Skin is warm and dry.  Neurological:     Mental Status: She is alert and oriented to person, place, and time.  Psychiatric:        Attention and Perception: Attention normal.        Mood and Affect: Mood normal.        Behavior: Behavior normal. Behavior is cooperative.        Thought Content: Thought content normal.        Judgment: Judgment normal.   EKG My review and personal interpretation at Time: 1145   Indication: CP Rate: 80  Rhythm: sinus Axis: normal Other: No nonspecific st abn, no stemi, no lvh -- some artifact present  Results for orders placed or performed in visit on 09/30/21  Fecal occult blood, imunochemical  Result Value Ref Range   IFOBT Negative       Assessment & Plan:   Problem List Items Addressed This Visit       Cardiovascular and Mediastinum   Hypertension    Chronic, stable.  BP at goal in office on  recheck and on home readings when performs.  Continue current diet control.  Consider Lisinopril if elevation in BP >130/80 consistently at home and office.  DASH Diet focus recommended.  LABS: BMP, TSH today.      Relevant Orders   Lipid Panel w/o Chol/HDL Ratio   Basic metabolic panel     Endocrine   Subclinical hypothyroidism    Subclinical at baseline.  Labs today, does not wish to start medication at this time, but will consider dependent on labs.      Relevant Orders   TSH   T4, free   Thyroid peroxidase antibody      Musculoskeletal and Integument   Osteoporosis    Does not wish medication for osteoporosis.  Continue Vit D and calcium at home.  Check Vit D today.  Discussed at length provider concern for fracture with this patient, due to having dogs and frequent injuries.  She will consider going for repeat DEXA and discuss further after this.      Relevant Orders   VITAMIN D 25 Hydroxy (Vit-D Deficiency, Fractures)   DG Bone Density     Other   Chronic anxiety - Primary    Chronic, ongoing for years and stable.  Denies SI/HI.  Has been on Xanax for several years.  Refills sent in today #30 with 2 refills.  Return in 3 months.  Tried Buspar last visit, but she reports this made her dizzy.  UDS obtained today.  Has contract on file.      Relevant Medications   ALPRAZolam (XANAX) 0.5 MG tablet   Other Relevant Orders   [id number] 11+Oxyco+Alc+Crt-Bund   Congestion of both ears    Overall reassuring exam, recommend visit to ENT due to recurrent nasal and ear issue -- including some recent nose bleeds -- recommend she stop Flonase when nose bleeds present and use saline spray only.  She declines ENT visit at this time,      Hypercholesteremia    Chronic, ongoing.  She never took Rosuvastatin ordered due to concerns about side effects.  Obtain lipid today and continue to recommend diet focus + highly recommend statin therapy.      Long-term current use of benzodiazepine    Refer to chronic anxiety plan of care.      Relevant Orders   [id number] 11+Oxyco+Alc+Crt-Bund   Other chest pain    EKG reassuring at this time, suspect this was more musculoskeletal due to her using arm bands and stretches prior to pain with no recurrence.  At this time recommend she continue to monitor and if an return of pain or worsening episodes then return to office immediately.  Labs today.      Relevant Orders   EKG 12-Lead (Completed)   Right hip pain    Ongoing since fall 2 months ago.  Mild discomfort, low suspicion  for fracture but due to her osteoporosis will obtain imaging and further assess.  Recommend continue use of Tylenol at home as needed.        Relevant Orders   DG Hip Unilat W OR W/O Pelvis 2-3 Views Right   Vitamin D deficiency    Chronic, ongoing.  Recheck Vit d level today and continue supplement daily.      Other Visit Diagnoses     Right hand pain       Improving at this time, if worsening then return to office.     Breast cancer screening by mammogram  Mammogram ordered   Relevant Orders   MM 3D SCREEN BREAST BILATERAL        Follow up plan: Return in about 3 months (around 01/26/2022) for MOOD, THYROID, HLD.

## 2021-10-29 NOTE — Assessment & Plan Note (Signed)
Chronic, ongoing.  She never took Rosuvastatin ordered due to concerns about side effects.  Obtain lipid today and continue to recommend diet focus + highly recommend statin therapy.

## 2021-10-29 NOTE — Assessment & Plan Note (Signed)
Chronic, ongoing for years and stable.  Denies SI/HI.  Has been on Xanax for several years.  Refills sent in today #30 with 2 refills.  Return in 3 months.  Tried Buspar last visit, but she reports this made her dizzy.  UDS obtained today.  Has contract on file.

## 2021-10-29 NOTE — Patient Instructions (Signed)
Managing Anxiety, Adult ?After being diagnosed with anxiety, you may be relieved to know why you have felt or behaved a certain way. You may also feel overwhelmed about the treatment ahead and what it will mean for your life. With care and support, you can manage this condition. ?How to manage lifestyle changes ?Managing stress and anxiety ?Stress is your body's reaction to life changes and events, both good and bad. Most stress will last just a few hours, but stress can be ongoing and can lead to more than just stress. Although stress can play a major role in anxiety, it is not the same as anxiety. Stress is usually caused by something external, such as a deadline, test, or competition. Stress normally passes after the triggering event has ended.  ?Anxiety is caused by something internal, such as imagining a terrible outcome or worrying that something will go wrong that will devastate you. Anxiety often does not go away even after the triggering event is over, and it can become long-term (chronic) worry. It is important to understand the differences between stress and anxiety and to manage your stress effectively so that it does not lead to an anxious response. ?Talk with your health care provider or a counselor to learn more about reducing anxiety and stress. He or she may suggest tension reduction techniques, such as: ?Music therapy. Spend time creating or listening to music that you enjoy and that inspires you. ?Mindfulness-based meditation. Practice being aware of your normal breaths while not trying to control your breathing. It can be done while sitting or walking. ?Centering prayer. This involves focusing on a word, phrase, or sacred image that means something to you and brings you peace. ?Deep breathing. To do this, expand your stomach and inhale slowly through your nose. Hold your breath for 3-5 seconds. Then exhale slowly, letting your stomach muscles relax. ?Self-talk. Learn to notice and identify  thought patterns that lead to anxiety reactions and change those patterns to thoughts that feel peaceful. ?Muscle relaxation. Taking time to tense muscles and then relax them. ?Choose a tension reduction technique that fits your lifestyle and personality. These techniques take time and practice. Set aside 5-15 minutes a day to do them. Therapists can offer counseling and training in these techniques. The training to help with anxiety may be covered by some insurance plans. ?Other things you can do to manage stress and anxiety include: ?Keeping a stress diary. This can help you learn what triggers your reaction and then learn ways to manage your response. ?Thinking about how you react to certain situations. You may not be able to control everything, but you can control your response. ?Making time for activities that help you relax and not feeling guilty about spending your time in this way. ?Doing visual imagery. This involves imagining or creating mental pictures to help you relax. ?Practicing yoga. Through yoga poses, you can lower tension and promote relaxation. ? ?Medicines ?Medicines can help ease symptoms. Medicines for anxiety include: ?Antidepressant medicines. These are usually prescribed for long-term daily control. ?Anti-anxiety medicines. These may be added in severe cases, especially when panic attacks occur. ?Medicines will be prescribed by a health care provider. When used together, medicines, psychotherapy, and tension reduction techniques may be the most effective treatment. ?Relationships ?Relationships can play a big part in helping you recover. Try to spend more time connecting with trusted friends and family members. ?Consider going to couples counseling if you have a partner, taking family education classes, or going to family   therapy. ?Therapy can help you and others better understand your condition. ?How to recognize changes in your anxiety ?Everyone responds differently to treatment for  anxiety. Recovery from anxiety happens when symptoms decrease and stop interfering with your daily activities at home or work. This may mean that you will start to: ?Have better concentration and focus. Worry will interfere less in your daily thinking. ?Sleep better. ?Be less irritable. ?Have more energy. ?Have improved memory. ?It is also important to recognize when your condition is getting worse. Contact your health care provider if your symptoms interfere with home or work and you feel like your condition is not improving. ?Follow these instructions at home: ?Activity ?Exercise. Adults should do the following: ?Exercise for at least 150 minutes each week. The exercise should increase your heart rate and make you sweat (moderate-intensity exercise). ?Strengthening exercises at least twice a week. ?Get the right amount and quality of sleep. Most adults need 7-9 hours of sleep each night. ?Lifestyle ? ?Eat a healthy diet that includes plenty of vegetables, fruits, whole grains, low-fat dairy products, and lean protein. ?Do not eat a lot of foods that are high in fats, added sugars, or salt (sodium). ?Make choices that simplify your life. ?Do not use any products that contain nicotine or tobacco. These products include cigarettes, chewing tobacco, and vaping devices, such as e-cigarettes. If you need help quitting, ask your health care provider. ?Avoid caffeine, alcohol, and certain over-the-counter cold medicines. These may make you feel worse. Ask your pharmacist which medicines to avoid. ?General instructions ?Take over-the-counter and prescription medicines only as told by your health care provider. ?Keep all follow-up visits. This is important. ?Where to find support ?You can get help and support from these sources: ?Self-help groups. ?Online and community organizations. ?A trusted spiritual leader. ?Couples counseling. ?Family education classes. ?Family therapy. ?Where to find more information ?You may find  that joining a support group helps you deal with your anxiety. The following sources can help you locate counselors or support groups near you: ?Mental Health America: www.mentalhealthamerica.net ?Anxiety and Depression Association of America (ADAA): www.adaa.org ?National Alliance on Mental Illness (NAMI): www.nami.org ?Contact a health care provider if: ?You have a hard time staying focused or finishing daily tasks. ?You spend many hours a day feeling worried about everyday life. ?You become exhausted by worry. ?You start to have headaches or frequently feel tense. ?You develop chronic nausea or diarrhea. ?Get help right away if: ?You have a racing heart and shortness of breath. ?You have thoughts of hurting yourself or others. ?If you ever feel like you may hurt yourself or others, or have thoughts about taking your own life, get help right away. Go to your nearest emergency department or: ?Call your local emergency services (911 in the [place] ?Call a suicide crisis helpline, such as the National Suicide Prevention Lifeline at 1-800-273-8255 or 988 in the [place] This is open 24 hours a day in the [place] ?Text the Crisis Text Line at 741741 (in the [place] ?Summary ?Taking steps to learn and use tension reduction techniques can help calm you and help prevent triggering an anxiety reaction. ?When used together, medicines, psychotherapy, and tension reduction techniques may be the most effective treatment. ?Family, friends, and partners can play a big part in supporting you. ?This information is not intended to replace advice given to you by your health care provider. Make sure you discuss any questions you have with your health care provider. ?Document Revised: 04/08/2021 Document Reviewed: 01/04/2021 ?Elsevier Patient   Education ? 2022 Elsevier Inc. ? ?

## 2021-10-29 NOTE — Assessment & Plan Note (Signed)
Chronic, ongoing.  Recheck Vit d level today and continue supplement daily.

## 2021-10-29 NOTE — Assessment & Plan Note (Signed)
Ongoing since fall 2 months ago.  Mild discomfort, low suspicion for fracture but due to her osteoporosis will obtain imaging and further assess.  Recommend continue use of Tylenol at home as needed.

## 2021-10-29 NOTE — Assessment & Plan Note (Signed)
Overall reassuring exam, recommend visit to ENT due to recurrent nasal and ear issue -- including some recent nose bleeds -- recommend she stop Flonase when nose bleeds present and use saline spray only.  She declines ENT visit at this time,

## 2021-10-29 NOTE — Assessment & Plan Note (Signed)
EKG reassuring at this time, suspect this was more musculoskeletal due to her using arm bands and stretches prior to pain with no recurrence.  At this time recommend she continue to monitor and if an return of pain or worsening episodes then return to office immediately.  Labs today.

## 2021-10-30 ENCOUNTER — Encounter: Payer: Self-pay | Admitting: Nurse Practitioner

## 2021-10-30 ENCOUNTER — Other Ambulatory Visit: Payer: Self-pay | Admitting: Nurse Practitioner

## 2021-10-30 LAB — BASIC METABOLIC PANEL
BUN/Creatinine Ratio: 15 (ref 12–28)
BUN: 12 mg/dL (ref 8–27)
CO2: 25 mmol/L (ref 20–29)
Calcium: 9.8 mg/dL (ref 8.7–10.3)
Chloride: 102 mmol/L (ref 96–106)
Creatinine, Ser: 0.79 mg/dL (ref 0.57–1.00)
Glucose: 93 mg/dL (ref 70–99)
Potassium: 4.6 mmol/L (ref 3.5–5.2)
Sodium: 142 mmol/L (ref 134–144)
eGFR: 79 mL/min/{1.73_m2} (ref >59)

## 2021-10-30 LAB — LIPID PANEL W/O CHOL/HDL RATIO
Cholesterol, Total: 249 mg/dL — ABNORMAL HIGH (ref 100–199)
HDL: 81 mg/dL (ref >39)
LDL Chol Calc (NIH): 153 mg/dL — ABNORMAL HIGH (ref 0–99)
Triglycerides: 90 mg/dL (ref 0–149)
VLDL Cholesterol Cal: 15 mg/dL (ref 5–40)

## 2021-10-30 LAB — VITAMIN D 25 HYDROXY (VIT D DEFICIENCY, FRACTURES): Vit D, 25-Hydroxy: 56.9 ng/mL (ref 30.0–100.0)

## 2021-10-30 LAB — TSH: TSH: 5.35 u[IU]/mL — ABNORMAL HIGH (ref 0.450–4.500)

## 2021-10-30 LAB — T4, FREE: Free T4: 1.54 ng/dL (ref 0.82–1.77)

## 2021-10-30 LAB — THYROID PEROXIDASE ANTIBODY: Thyroperoxidase Ab SerPl-aCnc: 274 IU/mL — ABNORMAL HIGH (ref 0–34)

## 2021-10-30 MED ORDER — LEVOTHYROXINE SODIUM 25 MCG PO TABS: 25.0000 ug | ORAL_TABLET | Freq: Every day | ORAL | 3 refills | Status: DC

## 2021-10-30 NOTE — Progress Notes (Signed)
Good morning crew, please let Brandy Chen know her labs have returned:  - Thyroid labs remain elevated + thyroid antibody elevated, this often means you have something called Hashimoto's thyroiditis -- inflammation of the thyroid due to autoimmune issue.  I do recommend starting Levothyroxine 25 MCG daily, which I will send in, and would like to recheck thyroid levels at next visit.  I will send this in as currently your thyroid is a little sluggish.  This may explain some of your dizziness, anxiety, and other symptoms. - Cholesterol labs remain elevated, which we can discuss further next visit as I know you prefer not to take medications. - Remainder of labs remain stable -- including your Vitamin D level and kidney function.  Any questions? Keep being stellar!!  Thank you for allowing me to participate in your care.  I appreciate you. Kindest regards, Brandy Chen

## 2021-10-31 LAB — DRUG SCREEN 764883 11+OXYCO+ALC+CRT-BUND
Amphetamines, Urine: NEGATIVE ng/mL
BENZODIAZ UR QL: NEGATIVE ng/mL
Barbiturate: NEGATIVE ng/mL
Cannabinoid Quant, Ur: NEGATIVE ng/mL
Cocaine (Metabolite): NEGATIVE ng/mL
Creatinine: 21.1 mg/dL (ref 20.0–300.0)
Ethanol: NEGATIVE %
Meperidine: NEGATIVE ng/mL
Methadone Screen, Urine: NEGATIVE ng/mL
OPIATE SCREEN URINE: NEGATIVE ng/mL
Oxycodone/Oxymorphone, Urine: NEGATIVE ng/mL
Phencyclidine: NEGATIVE ng/mL
Propoxyphene: NEGATIVE ng/mL
Tramadol: NEGATIVE ng/mL
pH, Urine: 6 (ref 4.5–8.9)

## 2021-10-31 NOTE — Progress Notes (Signed)
Good morning crew, please let Brandy Chen know her labs have returned:  - Thyroid labs remain elevated + thyroid antibody elevated, this often means you have something called Hashimoto's thyroiditis -- inflammation of the thyroid due to autoimmune issue. I do recommend starting Levothyroxine 25 MCG daily, which I will send in, and would like to recheck thyroid levels at next visit. I will send this in as currently your thyroid is a little sluggish. This may explain some of your dizziness, anxiety, and other symptoms. - Cholesterol labs remain elevated, which we can discuss further next visit as I know you prefer not to take medications. - Remainder of labs remain stable -- including your Vitamin D level and kidney function. Any questions? Keep being stellar!! Thank you for allowing me to participate in your care. I appreciate you. Kindest regards, Jaymond Waage

## 2021-11-03 ENCOUNTER — Other Ambulatory Visit: Payer: Self-pay

## 2021-11-03 ENCOUNTER — Ambulatory Visit
Admission: RE | Admit: 2021-11-03 | Discharge: 2021-11-03 | Disposition: A | Discharge status: Home / Self Care | Payer: Medicare HMO | Source: Ambulatory Visit | Attending: Nurse Practitioner | Admitting: Nurse Practitioner

## 2021-11-03 ENCOUNTER — Ambulatory Visit
Admission: RE | Admit: 2021-11-03 | Discharge: 2021-11-03 | Disposition: A | Discharge status: Home / Self Care | Payer: Medicare HMO | Attending: Nurse Practitioner | Admitting: Nurse Practitioner

## 2021-11-03 DIAGNOSIS — M25551 Pain in right hip: Secondary | ICD-10-CM | POA: Insufficient documentation

## 2021-11-03 DIAGNOSIS — M8588 Other specified disorders of bone density and structure, other site: Secondary | ICD-10-CM | POA: Diagnosis not present

## 2021-11-03 DIAGNOSIS — M1611 Unilateral primary osteoarthritis, right hip: Secondary | ICD-10-CM | POA: Diagnosis not present

## 2021-11-03 DIAGNOSIS — I878 Other specified disorders of veins: Secondary | ICD-10-CM | POA: Diagnosis not present

## 2021-11-04 ENCOUNTER — Telehealth: Payer: Self-pay | Admitting: Nurse Practitioner

## 2021-11-04 NOTE — Progress Notes (Signed)
Please let Brandy Chen know that her right hip is showing some degenerative (arthritic) changes.  This would explain some of her discomfort.  No fractures or other findings.  I would recommend current at home treatment and if any worsening pain let me know.  Have a great day!!

## 2021-11-04 NOTE — Telephone Encounter (Signed)
Copied from [place] [id number]. Topic: General - Call Back - No Documentation >> Nov 04, 2021 11:59 AM Scherrie Gerlach wrote: Reason for CRM: would like Jolene to call her back to talk about her labs.  Pt states she was not fasting and would consider repeating if Jolene agreed.

## 2021-11-04 NOTE — Telephone Encounter (Signed)
Reviewed labs with patient -- discussed at length current thyroid labs and concerns, she is willing to try Levothyroxine.

## 2021-11-11 ENCOUNTER — Other Ambulatory Visit: Payer: Self-pay | Admitting: Nurse Practitioner

## 2021-11-12 NOTE — Telephone Encounter (Signed)
Requested medication (s) are due for refill today: signed 2 weeks ago   Requested medication (s) are on the active medication list: yes  Last refill:  10/29/21 #30 2 refills  Future visit scheduled: yes   Notes to clinic:  not delegated per protocol. Do you want to sign refill again?     Requested Prescriptions  Pending Prescriptions Disp Refills   ALPRAZolam (XANAX) 0.5 MG tablet [Pharmacy Med Name: ALPRAZOLAM 0.5 MG TABLET] 30 tablet 2    Sig: TAKE 1 TABLET BY MOUTH EVERYDAY AT BEDTIME     Not Delegated - Psychiatry: Anxiolytics/Hypnotics 2 Failed - 11/11/2021  5:03 PM      Failed - This refill cannot be delegated      Passed - Urine Drug Screen completed in last 360 days      Passed - Patient is not pregnant      Passed - Valid encounter within last 6 months    Recent Outpatient Visits           2 weeks ago Chronic anxiety   [place], [place] T, NP   4 months ago Primary hypertension   [place], Henrine Screws T, NP   11 months ago Dizziness   [place], [place] T, NP   1 year ago Long-term current use of benzodiazepine   [place], Barbaraann Faster, NP   1 year ago Primary hypertension   [facility], Barbaraann Faster, NP       Future Appointments             In 2 months [place], Barbaraann Faster, NP [facility]   In 4 months  [facility]

## 2021-11-27 ENCOUNTER — Encounter: Payer: Self-pay | Admitting: Nurse Practitioner

## 2021-11-29 NOTE — Patient Instructions (Signed)

## 2021-12-04 ENCOUNTER — Other Ambulatory Visit: Payer: Self-pay

## 2021-12-04 ENCOUNTER — Ambulatory Visit (INDEPENDENT_AMBULATORY_CARE_PROVIDER_SITE_OTHER): Payer: Medicare HMO | Admitting: Nurse Practitioner

## 2021-12-04 ENCOUNTER — Encounter: Payer: Self-pay | Admitting: Nurse Practitioner

## 2021-12-04 VITALS — BP 135/76 | HR 62 | Temp 97.6°F | Ht 59.0 in | Wt 94.8 lb

## 2021-12-04 DIAGNOSIS — E063 Autoimmune thyroiditis: Secondary | ICD-10-CM

## 2021-12-04 NOTE — Assessment & Plan Note (Signed)
Recent diagnosis with Hashimoto's and started on Levothyroxine.  Discussed at length with her today diagnosis and medication use. Will recheck labs in May.  Order for thyroid ultrasound to further assess.   ?

## 2021-12-04 NOTE — Progress Notes (Signed)
? ?BP 135/76   Pulse 62   Temp 97.6 ?F (36.4 ?C) (Oral)   Ht '4\' 11"'  (1.499 m)   Wt 94 lb 12.8 oz (43 kg)   SpO2 98%   BMI 19.15 kg/m?   ? ?Subjective:  ? ? Patient ID: Brandy Chen, female    DOB: June 26, 1951, 71 y.o.   MRN: [id number] ? ?HPI: ?Brandy Chen is a 71 y.o. female ? ?Chief Complaint  ?Patient presents with  ? Medication Management  ?  Patient states she is here to discuss if she needs an ultrasound of her Thyroid. Patient states she feels dizziness and states she feels her heart still flutters. Patient denies seeing an Cardiologist since her last visit when we performed her EKG at her last appointment.   ? ?HYPOTHYROIDISM ?Started on medication on 11/04/21 due to elevation in TSH and thyroid antibody -- discussed at length Hashimoto's disease.  She was concerned about this.   ?Thyroid control status:stable ?Satisfied with current treatment? yes ?Medication side effects: no ?Medication compliance: good compliance ?Etiology of hypothyroidism:  ?Recent dose adjustment:no ?Fatigue: no ?Cold intolerance: yes ?Heat intolerance: no ?Weight gain: no ?Weight loss: no ?Constipation: no ?Diarrhea/loose stools: no ?Palpitations:  occasional ?Lower extremity edema: no ?Anxiety/depressed mood: yes  ? ?Relevant past medical, surgical, family and social history reviewed and updated as indicated. Interim medical history since our last visit reviewed. ?Allergies and medications reviewed and updated. ? ?Review of Systems  ?Constitutional:  Negative for activity change, appetite change, diaphoresis, fatigue and fever.  ?Respiratory:  Negative for cough, chest tightness and shortness of breath.   ?Cardiovascular:  Negative for chest pain, palpitations and leg swelling.  ?Gastrointestinal: Negative.   ?Neurological: Negative.   ?Psychiatric/Behavioral: Negative.    ? ?Per HPI unless specifically indicated above ? ?   ?Objective:  ?  ?BP 135/76   Pulse 62   Temp 97.6 ?F (36.4 ?C) (Oral)   Ht '4\' 11"'  (1.499 m)   Wt 94  lb 12.8 oz (43 kg)   SpO2 98%   BMI 19.15 kg/m?   ?Wt Readings from Last 3 Encounters:  ?12/04/21 94 lb 12.8 oz (43 kg)  ?10/29/21 95 lb 6.4 oz (43.3 kg)  ?06/18/21 96 lb 9.6 oz (43.8 kg)  ?  ?Physical Exam ?Vitals and nursing note reviewed.  ?Constitutional:   ?   General: She is awake. She is not in acute distress. ?   Appearance: She is well-developed. She is not ill-appearing.  ?HENT:  ?   Head: Normocephalic.  ?   Right Ear: Hearing, ear canal and external ear normal.  ?   Left Ear: Hearing, ear canal and external ear normal.  ?Eyes:  ?   General: Lids are normal.     ?   Right eye: No discharge.     ?   Left eye: No discharge.  ?   Conjunctiva/sclera: Conjunctivae normal.  ?   Pupils: Pupils are equal, round, and reactive to light.  ?Neck:  ?   Thyroid: No thyromegaly.  ?   Vascular: No carotid bruit.  ?Cardiovascular:  ?   Rate and Rhythm: Normal rate and regular rhythm.  ?   Heart sounds: Normal heart sounds. No murmur heard. ?  No gallop.  ?Pulmonary:  ?   Effort: Pulmonary effort is normal. No accessory muscle usage or respiratory distress.  ?   Breath sounds: Normal breath sounds.  ?Abdominal:  ?   General: Bowel sounds are normal.  ?  Palpations: Abdomen is soft.  ?Musculoskeletal:  ?   Cervical back: Normal range of motion and neck supple.  ?   Right lower leg: No edema.  ?   Left lower leg: No edema.  ?Skin: ?   General: Skin is warm and dry.  ?Neurological:  ?   Mental Status: She is alert and oriented to person, place, and time.  ?Psychiatric:     ?   Attention and Perception: Attention normal.     ?   Mood and Affect: Mood normal.     ?   Behavior: Behavior normal. Behavior is cooperative.     ?   Thought Content: Thought content normal.     ?   Judgment: Judgment normal.  ? ?Results for orders placed or performed in visit on 10/29/21  ?[id number] 11+Oxyco+Alc+Crt-Bund  ?Result Value Ref Range  ? Ethanol Negative Cutoff=0.020 %  ? Amphetamines, Urine Negative Cutoff=1000 ng/mL  ? Barbiturate Negative  Cutoff=200 ng/mL  ? BENZODIAZ UR QL Negative Cutoff=200 ng/mL  ? Cannabinoid Quant, Ur Negative Cutoff=50 ng/mL  ? Cocaine (Metabolite) Negative Cutoff=300 ng/mL  ? OPIATE SCREEN URINE Negative Cutoff=300 ng/mL  ? Oxycodone/Oxymorphone, Urine Negative Cutoff=300 ng/mL  ? Phencyclidine Negative Cutoff=25 ng/mL  ? Methadone Screen, Urine Negative Cutoff=300 ng/mL  ? Propoxyphene Negative Cutoff=300 ng/mL  ? Meperidine Negative Cutoff=200 ng/mL  ? Tramadol Negative Cutoff=200 ng/mL  ? Creatinine 21.1 20.0 - 300.0 mg/dL  ? pH, Urine 6.0 4.5 - 8.9  ?TSH  ?Result Value Ref Range  ? TSH 5.350 (H) 0.450 - 4.500 uIU/mL  ?T4, free  ?Result Value Ref Range  ? Free T4 1.54 0.82 - 1.77 ng/dL  ?Thyroid peroxidase antibody  ?Result Value Ref Range  ? Thyroperoxidase Ab SerPl-aCnc 274 (H) 0 - 34 IU/mL  ?Lipid Panel w/o Chol/HDL Ratio  ?Result Value Ref Range  ? Cholesterol, Total 249 (H) 100 - 199 mg/dL  ? Triglycerides 90 0 - 149 mg/dL  ? HDL 81 >39 mg/dL  ? VLDL Cholesterol Cal 15 5 - 40 mg/dL  ? LDL Chol Calc (NIH) 153 (H) 0 - 99 mg/dL  ?Basic metabolic panel  ?Result Value Ref Range  ? Glucose 93 70 - 99 mg/dL  ? BUN 12 8 - 27 mg/dL  ? Creatinine, Ser 0.79 0.57 - 1.00 mg/dL  ? eGFR 79 >59 mL/min/1.73  ? BUN/Creatinine Ratio 15 12 - 28  ? Sodium 142 134 - 144 mmol/L  ? Potassium 4.6 3.5 - 5.2 mmol/L  ? Chloride 102 96 - 106 mmol/L  ? CO2 25 20 - 29 mmol/L  ? Calcium 9.8 8.7 - 10.3 mg/dL  ?VITAMIN D 25 Hydroxy (Vit-D Deficiency, Fractures)  ?Result Value Ref Range  ? Vit D, 25-Hydroxy 56.9 30.0 - 100.0 ng/mL  ? ?   ?Assessment & Plan:  ? ?Problem List Items Addressed This Visit   ? ?  ? Endocrine  ? Hashimoto's thyroiditis - Primary  ?  Recent diagnosis with Hashimoto's and started on Levothyroxine.  Discussed at length with her today diagnosis and medication use. Will recheck labs in May.  Order for thyroid ultrasound to further assess.   ?  ?  ? Relevant Orders  ? US THYROID  ?  ? ?Follow up plan: ?Return for as scheduled  May. ? ? ? ? ? ?

## 2021-12-13 DIAGNOSIS — Z1211 Encounter for screening for malignant neoplasm of colon: Secondary | ICD-10-CM | POA: Diagnosis not present

## 2021-12-16 ENCOUNTER — Other Ambulatory Visit: Payer: Self-pay

## 2021-12-16 ENCOUNTER — Ambulatory Visit
Admission: RE | Admit: 2021-12-16 | Discharge: 2021-12-16 | Disposition: A | Discharge status: Home / Self Care | Payer: Medicare HMO | Source: Ambulatory Visit | Attending: Nurse Practitioner | Admitting: Nurse Practitioner

## 2021-12-16 DIAGNOSIS — E041 Nontoxic single thyroid nodule: Secondary | ICD-10-CM | POA: Diagnosis not present

## 2021-12-16 DIAGNOSIS — E063 Autoimmune thyroiditis: Secondary | ICD-10-CM | POA: Diagnosis not present

## 2021-12-16 DIAGNOSIS — E069 Thyroiditis, unspecified: Secondary | ICD-10-CM | POA: Diagnosis not present

## 2021-12-16 NOTE — Progress Notes (Signed)
Please let Jadis know her imaging has returned.  Overall it is reassuring, there was some inflammation of the thyroid which is compatible with her Hashimoto's thyroid disease and I recommend she take the Levothyroxine daily.  A nodule was noted, but this was very small and is considered non cancerous, no need for biopsy or further imaging on this.  If any questions let me know. ?Keep being awesome!!  Thank you for allowing me to participate in your care.  I appreciate you. ?Kindest regards, ?Bassy Fetterly ?

## 2021-12-21 LAB — COLOGUARD: COLOGUARD: NEGATIVE

## 2021-12-21 NOTE — Progress Notes (Signed)
Please let Santanna your her cologuard returned negative:) [place] news!!  No further colon cancer screening unless symptoms present.

## 2022-01-19 ENCOUNTER — Encounter: Payer: Self-pay | Admitting: Unknown Physician Specialty

## 2022-01-19 ENCOUNTER — Ambulatory Visit (INDEPENDENT_AMBULATORY_CARE_PROVIDER_SITE_OTHER): Payer: Medicare HMO | Admitting: Unknown Physician Specialty

## 2022-01-19 VITALS — BP 134/76 | HR 76 | Temp 98.3°F | Wt 93.4 lb

## 2022-01-19 DIAGNOSIS — J069 Acute upper respiratory infection, unspecified: Secondary | ICD-10-CM | POA: Diagnosis not present

## 2022-01-19 DIAGNOSIS — J029 Acute pharyngitis, unspecified: Secondary | ICD-10-CM | POA: Diagnosis not present

## 2022-01-19 MED ORDER — AMOXICILLIN 875 MG PO TABS: 875.0000 mg | ORAL_TABLET | Freq: Two times a day (BID) | ORAL | 0 refills | Status: DC

## 2022-01-19 NOTE — Progress Notes (Signed)
? ?BP 134/76   Pulse 76   Temp 98.3 ?F (36.8 ?C) (Oral)   Wt 93 lb 6.4 oz (42.4 kg)   SpO2 96%   BMI 18.86 kg/m?   ? ?Subjective:  ? ? Patient ID: Brandy Chen, female    DOB: 11-06-1950, 71 y.o.   MRN: [id number] ? ?HPI: ?Brandy Chen is a 71 y.o. female ? ?Chief Complaint  ?Patient presents with  ? Sore Throat  ?  Onset Sunday. Fever at home 101.2 yesterday. Afebrile in office   ? ?Sore Throat  ?This is a new problem. Episode onset: 3 days. The problem has been unchanged. Neither side of throat is experiencing more pain than the other. The maximum temperature recorded prior to her arrival was 101 - 101.9 F. The pain is moderate. Pertinent negatives include no congestion, coughing, ear pain, headaches or shortness of breath. She has had no exposure to strep or mono. She has tried acetaminophen for the symptoms. The treatment provided mild relief.  ? ? ?  01/19/2022  ? 11:29 AM 12/04/2021  ?  9:46 AM 10/29/2021  ? 10:34 AM 06/18/2021  ? 11:13 AM 03/09/2021  ? 10:33 AM  ?Depression screen PHQ 2/9  ?Decreased Interest 0 0 1 0 0  ?Down, Depressed, Hopeless 0 0 '1 1 1  '$ ?PHQ - 2 Score 0 0 '2 1 1  '$ ?Altered sleeping 1 0 1 2   ?Tired, decreased energy 0 1 1 0   ?Change in appetite 0 0 1 0   ?Feeling bad or failure about yourself  0 0 1 0   ?Trouble concentrating 0 0 0 0   ?Moving slowly or fidgety/restless 0 0 0 0   ?Suicidal thoughts 0 0 0 0   ?PHQ-9 Score '1 1 6 3   '$ ?Difficult doing work/chores    Not difficult at all   ? ? ?Relevant past medical, surgical, family and social history reviewed and updated as indicated. Interim medical history since our last visit reviewed. ?Allergies and medications reviewed and updated. ? ?Review of Systems  ?HENT:  Negative for congestion and ear pain.   ?Respiratory:  Negative for cough and shortness of breath.   ?Neurological:  Negative for headaches.  ? ?Per HPI unless specifically indicated above ? ?   ?Objective:  ?  ?BP 134/76   Pulse 76   Temp 98.3 ?F (36.8 ?C) (Oral)   Wt 93 lb  6.4 oz (42.4 kg)   SpO2 96%   BMI 18.86 kg/m?   ?Wt Readings from Last 3 Encounters:  ?01/19/22 93 lb 6.4 oz (42.4 kg)  ?12/04/21 94 lb 12.8 oz (43 kg)  ?10/29/21 95 lb 6.4 oz (43.3 kg)  ?  ?Physical Exam ?Constitutional:   ?   General: She is not in acute distress. ?   Appearance: Normal appearance. She is well-developed.  ?HENT:  ?   Head: Normocephalic and atraumatic.  ?   Mouth/Throat:  ?   Pharynx: Pharyngeal swelling, oropharyngeal exudate and posterior oropharyngeal erythema present.  ?Eyes:  ?   General: Lids are normal. No scleral icterus.    ?   Right eye: No discharge.     ?   Left eye: No discharge.  ?   Conjunctiva/sclera: Conjunctivae normal.  ?Neck:  ?   Vascular: No carotid bruit or JVD.  ?Cardiovascular:  ?   Rate and Rhythm: Normal rate and regular rhythm.  ?   Heart sounds: Normal heart sounds.  ?Pulmonary:  ?  Effort: Pulmonary effort is normal. No respiratory distress.  ?   Breath sounds: Normal breath sounds.  ?Abdominal:  ?   Palpations: There is no hepatomegaly or splenomegaly.  ?Musculoskeletal:     ?   General: Normal range of motion.  ?   Cervical back: Normal range of motion and neck supple.  ?Skin: ?   General: Skin is warm and dry.  ?   Coloration: Skin is not pale.  ?   Findings: No rash.  ?Neurological:  ?   Mental Status: She is alert and oriented to person, place, and time.  ?Psychiatric:     ?   Behavior: Behavior normal.     ?   Thought Content: Thought content normal.     ?   Judgment: Judgment normal.  ? ? ?Results for orders placed or performed in visit on 11/27/21  ?Cologuard  ?Result Value Ref Range  ? COLOGUARD Negative Negative  ? ?   ?Assessment & Plan:  ? ?Problem List Items Addressed This Visit   ?None ?Visit Diagnoses   ? ? Sore throat    -  Primary  ? Strep is negative but with exudate and fever.  Rx for Amoxil for 7 days.  Check Covid test.    ? Relevant Orders  ? Rapid Strep Screen ([facility] ONLY)  ? ?  ?  ? ?Follow up plan: ?Return if symptoms worsen or  fail to improve. ? ? ? ? ? ?

## 2022-01-19 NOTE — Addendum Note (Signed)
Addended by: Haywood Pao C on: 01/19/2022 11:37 AM ? ? Modules accepted: Orders ? ?

## 2022-01-21 LAB — NOVEL CORONAVIRUS, NAA: SARS-CoV-2, NAA: NOT DETECTED

## 2022-01-21 NOTE — Progress Notes (Signed)
Good morning, please let Bob know her Covid testing returned negative:)

## 2022-01-22 LAB — CULTURE, GROUP A STREP: Strep A Culture: NEGATIVE

## 2022-01-22 LAB — RAPID STREP SCREEN (MED CTR MEBANE ONLY): Strep Gp A Ag, IA W/Reflex: NEGATIVE

## 2022-01-23 NOTE — Patient Instructions (Signed)

## 2022-01-26 ENCOUNTER — Encounter: Payer: Self-pay | Admitting: Nurse Practitioner

## 2022-01-26 ENCOUNTER — Ambulatory Visit (INDEPENDENT_AMBULATORY_CARE_PROVIDER_SITE_OTHER): Payer: Medicare HMO | Admitting: Nurse Practitioner

## 2022-01-26 VITALS — BP 124/70 | HR 73 | Temp 98.0°F | Ht 59.0 in | Wt 93.8 lb

## 2022-01-26 DIAGNOSIS — I10 Essential (primary) hypertension: Secondary | ICD-10-CM

## 2022-01-26 DIAGNOSIS — Z79899 Other long term (current) drug therapy: Secondary | ICD-10-CM

## 2022-01-26 DIAGNOSIS — F419 Anxiety disorder, unspecified: Secondary | ICD-10-CM

## 2022-01-26 DIAGNOSIS — E063 Autoimmune thyroiditis: Secondary | ICD-10-CM

## 2022-01-26 DIAGNOSIS — M810 Age-related osteoporosis without current pathological fracture: Secondary | ICD-10-CM | POA: Diagnosis not present

## 2022-01-26 DIAGNOSIS — R42 Dizziness and giddiness: Secondary | ICD-10-CM

## 2022-01-26 DIAGNOSIS — E7800 Pure hypercholesterolemia, unspecified: Secondary | ICD-10-CM

## 2022-01-26 MED ORDER — ALPRAZOLAM 0.5 MG PO TABS: ORAL_TABLET | ORAL | 2 refills | Status: DC

## 2022-01-26 MED ORDER — MECLIZINE HCL 25 MG PO TABS: 25.0000 mg | ORAL_TABLET | Freq: Three times a day (TID) | ORAL | 3 refills | Status: DC | PRN

## 2022-01-26 NOTE — Assessment & Plan Note (Signed)
Chronic, stable.  BP at goal in office on recheck and on home readings when performs.  Continue current diet control.  Consider Lisinopril if elevation in BP >130/80 consistently at home and office.  DASH Diet focus recommended.  LABS: up to date. ?

## 2022-01-26 NOTE — Assessment & Plan Note (Signed)
Chronic, ongoing.  She never took Rosuvastatin ordered due to concerns about side effects.  Obtain lipid today and continue to recommend diet focus + highly recommend statin therapy. ?

## 2022-01-26 NOTE — Assessment & Plan Note (Signed)
Ongoing, started on Levothyroxine and overall tolerating, has some dizziness at baseline (worsening with recent URI and headaches after recent URI.  Low suspicion for relation to Levothyroxine.  Continue current medication regimen and adjust as needed.  Recheck labs today. ?

## 2022-01-26 NOTE — Assessment & Plan Note (Signed)
Refer to chronic anxiety plan of care. 

## 2022-01-26 NOTE — Assessment & Plan Note (Signed)
Chronic waxing and waning issue, has seen ENT in past for this.  Will send refills on Meclizine which offers benefit. ?

## 2022-01-26 NOTE — Assessment & Plan Note (Signed)
Chronic, ongoing for years and stable.  Denies SI/HI.  Has been on Xanax for several years.  Refills sent in today #30 with 2 refills.  Return in 3 months.  Tried Buspar in past, but reports this made her dizzy.  UDS obtained due next 10/29/22.  Has contract on file. ?

## 2022-01-26 NOTE — Progress Notes (Signed)
? ?BP 124/70   Pulse 73   Temp 98 ?F (36.7 ?C) (Oral)   Ht '4\' 11"'$  (1.499 m)   Wt 93 lb 12.8 oz (42.5 kg)   SpO2 98%   BMI 18.95 kg/m?   ? ?Subjective:  ? ? Patient ID: Brandy Chen, female    DOB: 1950-10-05, 71 y.o.   MRN: [id number] ? ?HPI: ?Brandy Chen is a 71 y.o. female ? ?Chief Complaint  ?Patient presents with  ? Mood  ? Hashimoto's Thyroiditis  ? Medication Problem  ?  Patient states she has noticed some headaches recently since started her Thyroid medication or she seems to think it may be related to her medication. Patient says she noticed the headaches when she wakes up first thing in the mornings.   ? Dizziness  ?  Patient states she is still experiencing dizziness.   ? ?HYPERLIPIDEMIA ?No current medications, sent in Crestor in past and she did not take. ?Supplements: fish oil ?Aspirin:  yes ?The 10-year ASCVD risk score (Arnett DK, et al., 2019) is: 12.4% ?  Values used to calculate the score: ?    Age: 64 years ?    Sex: Female ?    Is Non-Hispanic African American: No ?    Diabetic: No ?    Tobacco smoker: No ?    Systolic Blood Pressure: 169 mmHg ?    Is BP treated: No ?    HDL Cholesterol: 81 mg/dL ?    Total Cholesterol: 249 mg/dL ?Chest pain:  no ?Coronary artery disease:  no ?Family history CAD:  yes ?Family history early CAD:  no  ? ?HYPOTHYROIDISM ?Started on Levothyroxine 25 MCG daily in February 2023 due to elevation in thyroid levels ongoing and elevation in antibody.  She reports feeling like medication is causing headaches and dizziness.  Recent TSH 5.350 and antibody >200. ? ?Was sick on 01/19/22 treated with Amoxicillin (still completing) and has noticed dizziness since then and headaches.  Is taking Zyrtec for allergies.   ?Thyroid control status:stable ?Satisfied with current treatment?  Unsure if this or medication ?Medication side effects: no ?Medication compliance: good compliance ?Etiology of hypothyroidism: Hashimoto's ?Recent dose adjustment:no ?Fatigue: yes ?Cold  intolerance: yes ?Heat intolerance: no ?Weight gain: no ?Weight loss: no ?Constipation: no ?Diarrhea/loose stools: no ?Palpitations: no ?Lower extremity edema: no ?Anxiety/depressed mood: yes  ? ?OSTEOPOROSIS ?Noted on 2016 DEXA, has not returned for repeat imaging.  Does not want to take medications. ?Adequate calcium & vitamin D: yes ?Weight bearing exercises: yes  ? ?ANXIETY/STRESS ?Has been on Xanax at bedtime for long period, years.  She has tried Sertraline, but reports this was not successful + multiple other medications.  She tried Buspar, but this caused vertigo.  Pt is aware of risks of benzo medication use to include increased sedation, respiratory suppression, falls, dependence and cardiovascular events.  Pt would like to continue treatment as benefit. Currently she is taking 1/2 tablet Xanax during day and 1/2 at night. This works for her per her report and helps her mood.  Again reviewed BEERS criteria with her. PDMP last fill 01/18/22.   ?Duration: stable ?Anxious mood: yes ?Excessive worrying: yes ?Irritability: no  ?Sweating: no ?Nausea: no ?Palpitations:no ?Hyperventilation: no ?Panic attacks: no ?Agoraphobia: no  ?Obscessions/compulsions: no ?Depressed mood: a little bit ? ?  01/26/2022  ? 10:43 AM 01/19/2022  ? 11:29 AM 12/04/2021  ?  9:46 AM 10/29/2021  ? 10:34 AM 06/18/2021  ? 11:13 AM  ?Depression screen  PHQ 2/9  ?Decreased Interest 0 0 0 1 0  ?Down, Depressed, Hopeless 0 0 0 1 1  ?PHQ - 2 Score 0 0 0 2 1  ?Altered sleeping 1 1 0 1 2  ?Tired, decreased energy 0 0 1 1 0  ?Change in appetite 0 0 0 1 0  ?Feeling bad or failure about yourself  0 0 0 1 0  ?Trouble concentrating 0 0 0 0 0  ?Moving slowly or fidgety/restless 0 0 0 0 0  ?Suicidal thoughts 0 0 0 0 0  ?PHQ-9 Score '1 1 1 6 3  '$ ?Difficult doing work/chores     Not difficult at all  ?Anhedonia: no ?Weight changes: no ?Insomnia: yes hard to fall asleep --- Xanax helps ?Hypersomnia: no ?Fatigue/loss of energy: no ?Feelings of worthlessness:  no ?Feelings of guilt: no ?Impaired concentration/indecisiveness: no ?Suicidal ideations: no  ?Crying spells: no ?Recent Stressors/Life Changes: yes -- Covid ?  Relationship problems: no ?  Family stress: no   ?  Financial stress: no  ?  Job stress: no  ?  Recent death/loss: no ? ?  2022-02-08  ? 10:43 AM 12/04/2021  ?  9:47 AM 10/29/2021  ? 10:35 AM 06/18/2021  ? 11:13 AM  ?GAD 7 : Generalized Anxiety Score  ?Nervous, Anxious, on Edge 0 0 1 1  ?Control/stop worrying 0 0 0 1  ?Worry too much - different things 0 0 1 1  ?Trouble relaxing 0 '1 1 1  '$ ?Restless 0 0 0 1  ?Easily annoyed or irritable 0 0 1 1  ?Afraid - awful might happen 0 0 0 1  ?Total GAD 7 Score 0 '1 4 7  '$ ?Anxiety Difficulty Not difficult at all Not difficult at all Not difficult at all Somewhat difficult  ? ?Relevant past medical, surgical, family and social history reviewed and updated as indicated. Interim medical history since our last visit reviewed. ?Allergies and medications reviewed and updated. ? ?Review of Systems  ?Constitutional:  Negative for activity change, appetite change, diaphoresis, fatigue and fever.  ?Respiratory:  Negative for cough, chest tightness and shortness of breath.   ?Cardiovascular:  Negative for chest pain, palpitations and leg swelling.  ?Gastrointestinal:  Negative for abdominal distention, abdominal pain, constipation, diarrhea, nausea and vomiting.  ?Neurological:  Negative for dizziness, syncope, weakness, light-headedness, numbness and headaches.  ?Psychiatric/Behavioral: Negative.    ? ?Per HPI unless specifically indicated above ? ?   ?Objective:  ?  ?BP 124/70   Pulse 73   Temp 98 ?F (36.7 ?C) (Oral)   Ht '4\' 11"'$  (1.499 m)   Wt 93 lb 12.8 oz (42.5 kg)   SpO2 98%   BMI 18.95 kg/m?   ?Wt Readings from Last 3 Encounters:  ?2022/02/08 93 lb 12.8 oz (42.5 kg)  ?01/19/22 93 lb 6.4 oz (42.4 kg)  ?12/04/21 94 lb 12.8 oz (43 kg)  ?  ?Physical Exam ?Vitals and nursing note reviewed.  ?Constitutional:   ?   General: She is  awake. She is not in acute distress. ?   Appearance: She is well-developed. She is not ill-appearing.  ?HENT:  ?   Head: Normocephalic.  ?   Right Ear: Hearing and external ear normal.  ?   Left Ear: Hearing and external ear normal.  ?Eyes:  ?   General: Lids are normal.     ?   Right eye: No discharge.     ?   Left eye: No discharge.  ?   Conjunctiva/sclera: Conjunctivae  normal.  ?   Pupils: Pupils are equal, round, and reactive to light.  ?Neck:  ?   Thyroid: No thyromegaly.  ?   Vascular: No carotid bruit.  ?Cardiovascular:  ?   Rate and Rhythm: Normal rate and regular rhythm.  ?   Heart sounds: Normal heart sounds. No murmur heard. ?  No gallop.  ?Pulmonary:  ?   Effort: Pulmonary effort is normal. No accessory muscle usage or respiratory distress.  ?   Breath sounds: Normal breath sounds.  ?Abdominal:  ?   General: Bowel sounds are normal.  ?   Palpations: Abdomen is soft.  ?Musculoskeletal:  ?   Cervical back: Normal range of motion and neck supple.  ?   Right lower leg: No edema.  ?   Left lower leg: No edema.  ?Skin: ?   General: Skin is warm and dry.  ?Neurological:  ?   Mental Status: She is alert and oriented to person, place, and time.  ?Psychiatric:     ?   Attention and Perception: Attention normal.     ?   Mood and Affect: Mood normal.     ?   Behavior: Behavior normal. Behavior is cooperative.     ?   Thought Content: Thought content normal.     ?   Judgment: Judgment normal.  ? ?Results for orders placed or performed in visit on 01/19/22  ?Rapid Strep Screen ([facility] ONLY)  ? Specimen: Other  ? Other  ?Result Value Ref Range  ? Strep Gp A Ag, IA W/Reflex Negative Negative  ?Novel Coronavirus, NAA ([facility])  ? Specimen: Nasopharyngeal(NP) swabs in vial transport medium  ?Result Value Ref Range  ? SARS-CoV-2, NAA Not Detected Not Detected  ?Culture, Group A Strep  ? Other  ?Result Value Ref Range  ? Strep A Culture Negative   ? ?   ?Assessment & Plan:  ? ?Problem List Items Addressed This Visit    ? ?  ? Cardiovascular and Mediastinum  ? Hypertension - Primary  ?  Chronic, stable.  BP at goal in office on recheck and on home readings when performs.  Continue current diet control.  Consider Lisinopril if elevation in BP >13

## 2022-01-26 NOTE — Assessment & Plan Note (Signed)
Does not wish medication for osteoporosis.  Continue Vit D and calcium at home. Discussed at length provider concern for fracture with this patient, due to having dogs and frequent injuries.  She will consider going for repeat DEXA and discuss further after this. ?

## 2022-01-27 LAB — LIPID PANEL W/O CHOL/HDL RATIO
Cholesterol, Total: 208 mg/dL — ABNORMAL HIGH (ref 100–199)
HDL: 62 mg/dL (ref >39)
LDL Chol Calc (NIH): 129 mg/dL — ABNORMAL HIGH (ref 0–99)
Triglycerides: 93 mg/dL (ref 0–149)
VLDL Cholesterol Cal: 17 mg/dL (ref 5–40)

## 2022-01-27 LAB — TSH: TSH: 3.82 u[IU]/mL (ref 0.450–4.500)

## 2022-01-27 LAB — T4, FREE: Free T4: 1.88 ng/dL — ABNORMAL HIGH (ref 0.82–1.77)

## 2022-01-28 NOTE — Progress Notes (Signed)
Good day everyone, please let Brandy Chen know her labs have returned: ?- Thyroid labs show improved, normal, TSH.  Free T4 is very mildly elevated.  Recommend at this time continue current Levothyroxine dosing and we will recheck next visit.   ?- Cholesterol levels remain elevated, but better then last check.  Continue your fish oil.  Any questions? ?Keep being amazing!!  Thank you for allowing me to participate in your care.  I appreciate you. ?Kindest regards, ?Daxten Kovalenko ?

## 2022-02-01 DIAGNOSIS — H2513 Age-related nuclear cataract, bilateral: Secondary | ICD-10-CM | POA: Diagnosis not present

## 2022-02-01 DIAGNOSIS — H43812 Vitreous degeneration, left eye: Secondary | ICD-10-CM | POA: Diagnosis not present

## 2022-02-10 DIAGNOSIS — Z1231 Encounter for screening mammogram for malignant neoplasm of breast: Secondary | ICD-10-CM | POA: Diagnosis not present

## 2022-02-10 LAB — HM MAMMOGRAPHY

## 2022-03-12 ENCOUNTER — Ambulatory Visit: Payer: Medicare HMO

## 2022-03-16 ENCOUNTER — Ambulatory Visit (INDEPENDENT_AMBULATORY_CARE_PROVIDER_SITE_OTHER): Payer: Medicare HMO | Admitting: *Deleted

## 2022-03-16 DIAGNOSIS — Z0000 Encounter for general adult medical examination without abnormal findings: Secondary | ICD-10-CM | POA: Diagnosis not present

## 2022-03-16 NOTE — Patient Instructions (Signed)
[name] , Thank you for taking time to come for your Medicare Wellness Visit. I appreciate your ongoing commitment to your health goals. Please review the following plan we discussed and let me know if I can assist you in the future.   Screening recommendations/referrals: Colonoscopy: up to date Mammogram: up to date Bone Density: Education provided Recommended yearly ophthalmology/optometry visit for glaucoma screening and checkup Recommended yearly dental visit for hygiene and checkup  Vaccinations: Influenza vaccine: up to date Pneumococcal vaccine: Education provided Tdap vaccine: Education provided Shingles vaccine: Education provided    Advanced directives: Education provided  Conditions/risks identified:   Next appointment: 04-28-2022 @ 10:40  [place] 65 Years and Older, Female Preventive care refers to lifestyle choices and visits with your health care provider that can promote health and wellness. What does preventive care include? A yearly physical exam. This is also called an annual well check. Dental exams once or twice a year. Routine eye exams. Ask your health care provider how often you should have your eyes checked. Personal lifestyle choices, including: Daily care of your teeth and gums. Regular physical activity. Eating a healthy diet. Avoiding tobacco and drug use. Limiting alcohol use. Practicing safe sex. Taking low-dose aspirin every day. Taking vitamin and mineral supplements as recommended by your health care provider. What happens during an annual well check? The services and screenings done by your health care provider during your annual well check will depend on your age, overall health, lifestyle risk factors, and family history of disease. Counseling  Your health care provider may ask you questions about your: Alcohol use. Tobacco use. Drug use. Emotional well-being. Home and relationship well-being. Sexual activity. Eating  habits. History of falls. Memory and ability to understand (cognition). Work and work Statistician. Reproductive health. Screening  You may have the following tests or measurements: Height, weight, and BMI. Blood pressure. Lipid and cholesterol levels. These may be checked every 5 years, or more frequently if you are over 81 years old. Skin check. Lung cancer screening. You may have this screening every year starting at age 73 if you have a 30-pack-year history of smoking and currently smoke or have quit within the past 15 years. Fecal occult blood test (FOBT) of the stool. You may have this test every year starting at age 51. Flexible sigmoidoscopy or colonoscopy. You may have a sigmoidoscopy every 5 years or a colonoscopy every 10 years starting at age 5. Hepatitis C blood test. Hepatitis B blood test. Sexually transmitted disease (STD) testing. Diabetes screening. This is done by checking your blood sugar (glucose) after you have not eaten for a while (fasting). You may have this done every 1-3 years. Bone density scan. This is done to screen for osteoporosis. You may have this done starting at age 30. Mammogram. This may be done every 1-2 years. Talk to your health care provider about how often you should have regular mammograms. Talk with your health care provider about your test results, treatment options, and if necessary, the need for more tests. Vaccines  Your health care provider may recommend certain vaccines, such as: Influenza vaccine. This is recommended every year. Tetanus, diphtheria, and acellular pertussis (Tdap, Td) vaccine. You may need a Td booster every 10 years. Zoster vaccine. You may need this after age 42. Pneumococcal 13-valent conjugate (PCV13) vaccine. One dose is recommended after age 44. Pneumococcal polysaccharide (PPSV23) vaccine. One dose is recommended after age 68. Talk to your health care provider about which  screenings and vaccines you need and how  often you need them. This information is not intended to replace advice given to you by your health care provider. Make sure you discuss any questions you have with your health care provider. Document Released: 10/10/2015 Document Revised: 06/02/2016 Document Reviewed: 07/15/2015 Elsevier Interactive Patient Education  2017 [employer] Prevention in the Home Falls can cause injuries. They can happen to people of all ages. There are many things you can do to make your home safe and to help prevent falls. What can I do on the outside of my home? Regularly fix the edges of walkways and driveways and fix any cracks. Remove anything that might make you trip as you walk through a door, such as a raised step or threshold. Trim any bushes or trees on the path to your home. Use bright outdoor lighting. Clear any walking paths of anything that might make someone trip, such as rocks or tools. Regularly check to see if handrails are loose or broken. Make sure that both sides of any steps have handrails. Any raised decks and porches should have guardrails on the edges. Have any leaves, snow, or ice cleared regularly. Use sand or salt on walking paths during winter. Clean up any spills in your garage right away. This includes oil or grease spills. What can I do in the bathroom? Use night lights. Install grab bars by the toilet and in the tub and shower. Do not use towel bars as grab bars. Use non-skid mats or decals in the tub or shower. If you need to sit down in the shower, use a plastic, non-slip stool. Keep the floor dry. Clean up any water that spills on the floor as soon as it happens. Remove soap buildup in the tub or shower regularly. Attach bath mats securely with double-sided non-slip rug tape. Do not have throw rugs and other things on the floor that can make you trip. What can I do in the bedroom? Use night lights. Make sure that you have a light by your bed that is easy to  reach. Do not use any sheets or blankets that are too big for your bed. They should not hang down onto the floor. Have a firm chair that has side arms. You can use this for support while you get dressed. Do not have throw rugs and other things on the floor that can make you trip. What can I do in the kitchen? Clean up any spills right away. Avoid walking on wet floors. Keep items that you use a lot in easy-to-reach places. If you need to reach something above you, use a strong step stool that has a grab bar. Keep electrical cords out of the way. Do not use floor polish or wax that makes floors slippery. If you must use wax, use non-skid floor wax. Do not have throw rugs and other things on the floor that can make you trip. What can I do with my stairs? Do not leave any items on the stairs. Make sure that there are handrails on both sides of the stairs and use them. Fix handrails that are broken or loose. Make sure that handrails are as long as the stairways. Check any carpeting to make sure that it is firmly attached to the stairs. Fix any carpet that is loose or worn. Avoid having throw rugs at the top or bottom of the stairs. If you do have throw rugs, attach them to the floor with carpet  tape. Make sure that you have a light switch at the top of the stairs and the bottom of the stairs. If you do not have them, ask someone to add them for you. What else can I do to help prevent falls? Wear shoes that: Do not have high heels. Have rubber bottoms. Are comfortable and fit you well. Are closed at the toe. Do not wear sandals. If you use a stepladder: Make sure that it is fully opened. Do not climb a closed stepladder. Make sure that both sides of the stepladder are locked into place. Ask someone to hold it for you, if possible. Clearly mark and make sure that you can see: Any grab bars or handrails. First and last steps. Where the edge of each step is. Use tools that help you move  around (mobility aids) if they are needed. These include: Canes. Walkers. Scooters. Crutches. Turn on the lights when you go into a dark area. Replace any light bulbs as soon as they burn out. Set up your furniture so you have a clear path. Avoid moving your furniture around. If any of your floors are uneven, fix them. If there are any pets around you, be aware of where they are. Review your medicines with your doctor. Some medicines can make you feel dizzy. This can increase your chance of falling. Ask your doctor what other things that you can do to help prevent falls. This information is not intended to replace advice given to you by your health care provider. Make sure you discuss any questions you have with your health care provider. Document Released: 07/10/2009 Document Revised: 02/19/2016 Document Reviewed: 10/18/2014 Elsevier Interactive Patient Education  2017 [employer].

## 2022-03-16 NOTE — Progress Notes (Signed)
Subjective:   Brandy Chen is a 71 y.o. female who presents for Medicare Annual (Subsequent) preventive examination.  I connected with  Brandy Chen on 03/16/22 by a telephone enabled telemedicine application and verified that I am speaking with the correct person using two identifiers.   I discussed the limitations of evaluation and management by telemedicine. The patient expressed understanding and agreed to proceed.  Patient location: home  Provider location:  tele-health- home    Review of Systems     Cardiac Risk Factors include: advanced age (>57mn, >>64women);hypertension     Objective:    Today's Vitals   There is no height or weight on file to calculate BMI.     03/16/2022   10:37 AM 03/09/2021   10:32 AM 03/05/2020   10:30 AM 03/26/2019   10:36 AM 03/20/2018   11:20 AM 02/17/2017    1:18 PM 02/16/2016   10:07 AM  Advanced Directives  Does Patient Have a Medical Advance Directive? No No No No No No No  Does patient want to make changes to medical advance directive?   Yes (MAU/Ambulatory/Procedural Areas - Information given)      Would patient like information on creating a medical advance directive? No - Patient declined    Yes (MAU/Ambulatory/Procedural Areas - Information given) Yes (MAU/Ambulatory/Procedural Areas - Information given)     Current Medications (verified) Outpatient Encounter Medications as of 03/16/2022  Medication Sig   ALPRAZolam (XANAX) 0.5 MG tablet TAKE 1 TABLET BY MOUTH EVERYDAY AT BEDTIME   amoxicillin (AMOXIL) 875 MG tablet Take 1 tablet (875 mg total) by mouth 2 (two) times daily.   aspirin EC 81 MG tablet Take 81 mg by mouth.   Calcium Carb-Cholecalciferol 600-800 MG-UNIT TABS Take 1 tablet by mouth 2 (two) times daily.   Cetirizine HCl 10 MG CAPS Take 1 capsule by mouth daily.   chlorhexidine (PERIDEX) 0.12 % solution 15 mLs 2 (two) times daily.   Ginkgo Biloba (GINKOBA PO) Take by mouth.   Krill Oil 300 MG CAPS Take 300 mg by mouth  daily.   levothyroxine (SYNTHROID) 25 MCG tablet Take 1 tablet (25 mcg total) by mouth daily.   Magnesium 500 MG TABS Take 500 mg by mouth daily.   meclizine (ANTIVERT) 25 MG tablet Take 1 tablet (25 mg total) by mouth 3 (three) times daily as needed for dizziness.   vitamin C (ASCORBIC ACID) 500 MG tablet Take 500 mg by mouth daily.   No facility-administered encounter medications on file as of 03/16/2022.    Allergies (verified) Sulfa antibiotics   History: Past Medical History:  Diagnosis Date   Anxiety    Depression    Endometriosis    Fatigue    Hyperlipidemia    Hypertension    Insomnia    Lumbago    Osteoporosis    Thyroid disease    Vitamin D deficiency    Past Surgical History:  Procedure Laterality Date   CESAREAN SECTION     pyloric stenosis     TUBAL LIGATION     Family History  Problem Relation Age of Onset   Stroke Mother    Hypertension Mother    Stroke Maternal Grandfather    Hypertension Sister    Social History   Socioeconomic History   Marital status: Married    Spouse name: Not on file   Number of children: Not on file   Years of education: Not on file   Highest education level: High  school graduate  Occupational History   Not on file  Tobacco Use   Smoking status: Never   Smokeless tobacco: Never  Vaping Use   Vaping Use: Never used  Substance and Sexual Activity   Alcohol use: No    Alcohol/week: 0.0 standard drinks of alcohol   Drug use: Yes    Types: Benzodiazepines   Sexual activity: Not Currently  Other Topics Concern   Not on file  Social History Narrative   Not on file   Social Determinants of Health   Financial Resource Strain: Low Risk  (03/16/2022)   Overall Financial Resource Strain (CARDIA)    Difficulty of Paying Living Expenses: Not hard at all  Food Insecurity: No Food Insecurity (03/16/2022)   Hunger Vital Sign    Worried About Running Out of Food in the Last Year: Never true    [place] in the Last  Year: Never true  Transportation Needs: Unknown (03/16/2022)   PRAPARE - Transportation    Lack of Transportation (Medical): No    Lack of Transportation (Non-Medical): Not on file  Physical Activity: Insufficiently Active (03/16/2022)   Exercise Vital Sign    Days of Exercise per Week: 5 days    Minutes of Exercise per Session: 10 min  Stress: No Stress Concern Present (03/16/2022)   [place]    Feeling of Stress : Only a little  Social Connections: Moderately Integrated (03/16/2022)   Social Connection and Isolation Panel [NHANES]    Frequency of Communication with Friends and Family: More than three times a week    Frequency of Social Gatherings with Friends and Family: More than three times a week    Attends Religious Services: More than 4 times per year    Active Member of [employer] or Organizations: No    Attends Music therapist: Never    Marital Status: Married    Tobacco Counseling Counseling given: Not Answered   Clinical Intake:  Pre-visit preparation completed: Yes  Pain : No/denies pain     Nutritional Risks: None Diabetes: No  How often do you need to have someone help you when you read instructions, pamphlets, or other written materials from your doctor or pharmacy?: 1 - Never  Diabetic?  no  Interpreter Needed?: No  Information entered by :: Leroy Kennedy LPN   Activities of Daily Living    03/16/2022   10:36 AM  In your present state of health, do you have any difficulty performing the following activities:  Hearing? 0  Vision? 0  Difficulty concentrating or making decisions? 0  Walking or climbing stairs? 0  Dressing or bathing? 0  Doing errands, shopping? 0  Preparing Food and eating ? N  Using the Toilet? N  In the past six months, have you accidently leaked urine? N  Do you have problems with loss of bowel control? N  Managing your Medications? N  Managing your  Finances? N  Housekeeping or managing your Housekeeping? N    Patient Care Team: Venita Lick, NP as PCP - General (Nurse Practitioner)  Indicate any recent Medical Services you may have received from other than [facility] providers in the past year (date may be approximate).     Assessment:   This is a routine wellness examination for Brandy Chen.  Hearing/Vision screen Hearing Screening - Comments:: No trouble hearing Vision Screening - Comments:: Ellin Mayhew Up to date  Dietary issues and exercise activities discussed: Current Exercise  Habits: Home exercise routine, Type of exercise: strength training/weights;stretching, Time (Minutes): 20, Frequency (Times/Week): 6, Weekly Exercise (Minutes/Week): 120, Intensity: Mild   Goals Addressed             This Visit's Progress    Patient Stated       Continue with lowering cholesterol       Depression Screen    03/16/2022   10:40 AM 01/26/2022   10:43 AM 01/19/2022   11:29 AM 12/04/2021    9:46 AM 10/29/2021   10:34 AM 06/18/2021   11:13 AM 03/09/2021   10:33 AM  PHQ 2/9 Scores  PHQ - 2 Score 2 0 0 0 '2 1 1  '$ PHQ- 9 Score '7 1 1 1 6 3     '$ Fall Risk    03/16/2022   10:33 AM 01/26/2022   10:43 AM 10/29/2021   10:34 AM 03/09/2021   10:32 AM 10/27/2020   11:04 AM  Fall Risk   Falls in the past year? 0 0 1 0 0  Number falls in past yr: 0 0 0    Injury with Fall? 0 0 1    Risk for fall due to :  No Fall Risks History of fall(s) Medication side effect   Follow up Falls evaluation completed;Education provided;Falls prevention discussed Falls evaluation completed Falls evaluation completed Falls evaluation completed;Education provided;Falls prevention discussed     FALL RISK PREVENTION PERTAINING TO THE HOME:  Any stairs in or around the home? No  If so, are there any without handrails? No  Home free of loose throw rugs in walkways, pet beds, electrical cords, etc? Yes  Adequate lighting in your home to reduce risk of falls? Yes    ASSISTIVE DEVICES UTILIZED TO PREVENT FALLS:  Life alert? No  Use of a cane, walker or w/c? No  Grab bars in the bathroom? No  Shower chair or bench in shower? No  Elevated toilet seat or a handicapped toilet? Yes   TIMED UP AND GO:  Was the test performed? No .    Cognitive Function:        03/16/2022   10:34 AM 03/09/2021   10:35 AM 03/26/2019   10:40 AM 03/20/2018   11:21 AM 02/17/2017    1:19 PM  6CIT Screen  What Year? 0 points 0 points 0 points 0 points 0 points  What month? 0 points 0 points 0 points 0 points 0 points  What time? 0 points 0 points 0 points 0 points 0 points  Count back from 20 0 points 0 points 0 points 0 points 0 points  Months in reverse 0 points 0 points 0 points 0 points 0 points  Repeat phrase 0 points 0 points 0 points 0 points 0 points  Total Score 0 points 0 points 0 points 0 points 0 points    Immunizations Immunization History  Administered Date(s) Administered   Fluad Quad(high Dose 65+) 06/25/2019, 07/09/2020, 07/15/2021   Influenza, High Dose Seasonal PF 08/23/2016, 08/04/2017, 07/17/2018   Influenza-Unspecified 06/27/2015   Moderna Sars-Covid-2 Vaccination 11/02/2019, 12/04/2019   Pneumococcal Conjugate-13 02/16/2016    TDAP status: Due, Education has been provided regarding the importance of this vaccine. Advised may receive this vaccine at local pharmacy or Health Dept. Aware to provide a copy of the vaccination record if obtained from local pharmacy or Health Dept. Verbalized acceptance and understanding.  Flu Vaccine status: Up to date  Pneumococcal vaccine status: Due, Education has been provided regarding the importance of this  vaccine. Advised may receive this vaccine at local pharmacy or Health Dept. Aware to provide a copy of the vaccination record if obtained from local pharmacy or Health Dept. Verbalized acceptance and understanding.  Covid-19 vaccine status: Information provided on how to obtain vaccines.   Qualifies  for Shingles Vaccine? Yes   Zostavax completed No   Shingrix Completed?: No.    Education has been provided regarding the importance of this vaccine. Patient has been advised to call insurance company to determine out of pocket expense if they have not yet received this vaccine. Advised may also receive vaccine at local pharmacy or Health Dept. Verbalized acceptance and understanding.  Screening Tests Health Maintenance  Topic Date Due   Zoster Vaccines- Shingrix (1 of 2) 06/16/2022 (Originally 12/17/1969)   TETANUS/TDAP  06/18/2022 (Originally 12/17/1969)   Pneumonia Vaccine 67+ Years old (2 - PPSV23 if available, else PCV20) 10/29/2022 (Originally 02/15/2017)   DEXA SCAN  10/29/2022 (Originally 02/17/2017)   INFLUENZA VACCINE  04/27/2022   MAMMOGRAM  07/16/2022   Fecal DNA (Cologuard)  12/13/2024   Hepatitis C Screening  Completed   HPV VACCINES  Aged Out   COLON CANCER SCREENING ANNUAL FOBT  Discontinued   COVID-19 Vaccine  Discontinued    Health Maintenance  There are no preventive care reminders to display for this patient.   Colorectal cancer screening: Type of screening: Cologuard. Completed 2023. Repeat every 3 years  Mammogram status: Completed  . Repeat every year  Bone Density status: Ordered  . Pt provided with contact info and advised to call to schedule appt.  Lung Cancer Screening: (Low Dose CT Chest recommended if Age 22-80 years, 30 pack-year currently smoking OR have quit w/in 15years.) does not qualify.   Lung Cancer Screening Referral:   Additional Screening:  Hepatitis C Screening: does not qualify; Completed 2019  Vision Screening: Recommended annual ophthalmology exams for early detection of glaucoma and other disorders of the eye. Is the patient up to date with their annual eye exam?  Yes  Who is the provider or what is the name of the office in which the patient attends annual eye exams? [facility] If pt is not established with a provider, would they like  to be referred to a provider to establish care? No .   Dental Screening: Recommended annual dental exams for proper oral hygiene  Community Resource Referral / Chronic Care Management: CRR required this visit?  No   CCM required this visit?  No      Plan:     I have personally reviewed and noted the following in the patient's chart:   Medical and social history Use of alcohol, tobacco or illicit drugs  Current medications and supplements including opioid prescriptions.  Functional ability and status Nutritional status Physical activity Advanced directives List of other physicians Hospitalizations, surgeries, and ER visits in previous 12 months Vitals Screenings to include cognitive, depression, and falls Referrals and appointments  In addition, I have reviewed and discussed with patient certain preventive protocols, quality metrics, and best practice recommendations. A written personalized care plan for preventive services as well as general preventive health recommendations were provided to patient.     Leroy Kennedy, LPN   [id number]   Nurse Notes:

## 2022-04-25 NOTE — Patient Instructions (Signed)

## 2022-04-28 ENCOUNTER — Ambulatory Visit (INDEPENDENT_AMBULATORY_CARE_PROVIDER_SITE_OTHER): Payer: Medicare HMO | Admitting: Nurse Practitioner

## 2022-04-28 ENCOUNTER — Encounter: Payer: Self-pay | Admitting: Nurse Practitioner

## 2022-04-28 VITALS — BP 132/72 | HR 60 | Temp 97.4°F | Ht 59.0 in | Wt 92.4 lb

## 2022-04-28 DIAGNOSIS — F419 Anxiety disorder, unspecified: Secondary | ICD-10-CM

## 2022-04-28 DIAGNOSIS — Z79899 Other long term (current) drug therapy: Secondary | ICD-10-CM | POA: Diagnosis not present

## 2022-04-28 DIAGNOSIS — J0110 Acute frontal sinusitis, unspecified: Secondary | ICD-10-CM | POA: Diagnosis not present

## 2022-04-28 DIAGNOSIS — J321 Chronic frontal sinusitis: Secondary | ICD-10-CM | POA: Insufficient documentation

## 2022-04-28 DIAGNOSIS — E063 Autoimmune thyroiditis: Secondary | ICD-10-CM

## 2022-04-28 MED ORDER — FOSTEUM 27-20-200 MG-MG-UNIT PO CAPS: 1.0000 | ORAL_CAPSULE | Freq: Two times a day (BID) | ORAL | 12 refills | Status: DC

## 2022-04-28 MED ORDER — PREDNISONE 20 MG PO TABS: 40.0000 mg | ORAL_TABLET | Freq: Every day | ORAL | 0 refills | Status: AC

## 2022-04-28 MED ORDER — ALPRAZOLAM 0.5 MG PO TABS: ORAL_TABLET | ORAL | 2 refills | Status: DC

## 2022-04-28 MED ORDER — AZITHROMYCIN 250 MG PO TABS
ORAL_TABLET | ORAL | 0 refills | Status: AC
Start: 2022-04-28 — End: 2022-05-03

## 2022-04-28 NOTE — Assessment & Plan Note (Signed)
Refer to chronic anxiety plan of care. 

## 2022-04-28 NOTE — Progress Notes (Signed)
BP 132/72   Pulse 60   Temp (!) 97.4 F (36.3 C) (Oral)   Ht '4\' 11"'$  (1.499 m)   Wt 92 lb 6.4 oz (41.9 kg)   SpO2 98%   BMI 18.66 kg/m    Subjective:    Patient ID: Brandy Chen, female    DOB: [birth year], 71 y.o.   MRN: [id number]  HPI: Brandy Chen is a 71 y.o. female  Chief Complaint  Patient presents with   Mood   Hashimoto's Thyroiditis   Sinus Problem    Patient says she would like to discuss a possible sinus infection. Patient says she has been having issues since April and says that she has been dealing with drainage and notice it is worse during the morning when she wakes up. Patient says she has been taking OTC Zyrtec.    HYPOTHYROIDISM Started on Levothyroxine 25 MCG daily in February 2023.  Skips a day every once and awhile. Thyroid control status:stable Satisfied with current treatment? yes Medication side effects: no Medication compliance: good compliance Etiology of hypothyroidism: Hashimoto's Recent dose adjustment:no Fatigue: occasional Cold intolerance: yes Heat intolerance: no Weight gain: no Weight loss: no Constipation: no Diarrhea/loose stools: no Palpitations: no Lower extremity edema: no Anxiety/depressed mood: yes   UPPER RESPIRATORY TRACT INFECTION Was sick in April and is feeling like she is having some sinus drainage present again -- started about a few weeks ago.  Having some discoloration to drainage.  Taking Zyrtec + occasionally using a nasal spray OTC.  Has not seen ENT in past, struggles with sinuses often. Fever: no Cough: no Shortness of breath: no Wheezing: no Chest pain: no Chest tightness: no Chest congestion: yes Nasal congestion: no Runny nose: no Post nasal drip: no Sneezing: no Sore throat: no Swollen glands: no Sinus pressure: yes Headache: yes Face pain: no Toothache: no Ear pain: none Ear pressure: yes "right Eyes red/itching:no Eye drainage/crusting: no  Vomiting: no Rash: no Fatigue: no Sick  contacts: no Strep contacts: no  Context: fluctuating Recurrent sinusitis: no Relief with OTC cold/cough medications: yes  Treatments attempted: as above    ANXIETY/STRESS Has been on Xanax at bedtime for long period, years.  Currently she is taking 1/2 tablet Xanax during day and 1/2 at night.  She has tried Sertraline, but reports this was not successful + multiple other medications. She tried Buspar, but this caused vertigo.  Pt is aware of risks of benzo medication use to include increased sedation, respiratory suppression, falls, dependence and cardiovascular events.  Pt would like to continue treatment as benefit determine to outweigh risk. This works for her per her report and helps her mood.  Again reviewed BEERS criteria with her. PDMP last fill 04/17/22.   Duration: stable Anxious mood: yes Excessive worrying: yes Irritability: no  Sweating: no Nausea: no Palpitations:no Hyperventilation: no Panic attacks: no Agoraphobia: no  Obscessions/compulsions: no Depressed mood: occasional    04/28/2022   10:52 AM 03/16/2022   10:40 AM 01/26/2022   10:43 AM 01/19/2022   11:29 AM 12/04/2021    9:46 AM  Depression screen PHQ 2/9  Decreased Interest 0 1 0 0 0  Down, Depressed, Hopeless 0 1 0 0 0  PHQ - 2 Score 0 2 0 0 0  Altered sleeping 0 '1 1 1 '$ 0  Tired, decreased energy 0 2 0 0 1  Change in appetite 0 1 0 0 0  Feeling bad or failure about yourself  0 1 0 0 0  Trouble concentrating 0 0 0 0 0  Moving slowly or fidgety/restless 0 0 0 0 0  Suicidal thoughts 0 0 0 0 0  PHQ-9 Score 0 '7 1 1 1  '$ Difficult doing work/chores Not difficult at all Not difficult at all     Anhedonia: no Weight changes: no Insomnia: yes hard to fall asleep --- Xanax helps Hypersomnia: no Fatigue/loss of energy: no Feelings of worthlessness: no Feelings of guilt: no Impaired concentration/indecisiveness: no Suicidal ideations: no  Crying spells: no Recent Stressors/Life Changes: yes -- watches too much [employer]     Relationship problems: no   Family stress: no     Financial stress: no    Job stress: no    Recent death/loss: no    05/03/2022   10:52 AM 01/26/2022   10:43 AM 12/04/2021    9:47 AM 10/29/2021   10:35 AM  GAD 7 : Generalized Anxiety Score  Nervous, Anxious, on Edge 0 0 0 1  Control/stop worrying 0 0 0 0  Worry too much - different things 0 0 0 1  Trouble relaxing 0 0 1 1  Restless 0 0 0 0  Easily annoyed or irritable 0 0 0 1  Afraid - awful might happen 0 0 0 0  Total GAD 7 Score 0 0 1 4  Anxiety Difficulty Not difficult at all Not difficult at all Not difficult at all Not difficult at all   Relevant past medical, surgical, family and social history reviewed and updated as indicated. Interim medical history since our last visit reviewed. Allergies and medications reviewed and updated.  Review of Systems  Constitutional:  Negative for activity change, appetite change, diaphoresis, fatigue and fever.  HENT:  Positive for congestion, sinus pressure and sinus pain. Negative for ear discharge, ear pain and sore throat.   Respiratory:  Negative for cough, chest tightness and shortness of breath.   Cardiovascular:  Negative for chest pain, palpitations and leg swelling.  Gastrointestinal:  Negative for abdominal distention, abdominal pain, constipation, diarrhea, nausea and vomiting.  Neurological:  Positive for headaches. Negative for dizziness, syncope, weakness, light-headedness and numbness.  Psychiatric/Behavioral: Negative.      Per HPI unless specifically indicated above     Objective:    BP 132/72   Pulse 60   Temp (!) 97.4 F (36.3 C) (Oral)   Ht '4\' 11"'$  (1.499 m)   Wt 92 lb 6.4 oz (41.9 kg)   SpO2 98%   BMI 18.66 kg/m   Wt Readings from Last 3 Encounters:  05-03-22 92 lb 6.4 oz (41.9 kg)  01/26/22 93 lb 12.8 oz (42.5 kg)  01/19/22 93 lb 6.4 oz (42.4 kg)    Physical Exam Vitals and nursing note reviewed.  Constitutional:      General: She is awake. She is not  in acute distress.    Appearance: She is well-developed. She is not ill-appearing.  HENT:     Head: Normocephalic.     Right Ear: Hearing, ear canal and external ear normal. A middle ear effusion is present.     Left Ear: Hearing, ear canal and external ear normal. A middle ear effusion is present.     Nose: Rhinorrhea present. Rhinorrhea is clear.     Right Sinus: Frontal sinus tenderness present. No maxillary sinus tenderness.     Left Sinus: Frontal sinus tenderness present. No maxillary sinus tenderness.     Mouth/Throat:     Mouth: Mucous membranes are moist.     Pharynx:  Posterior oropharyngeal erythema (with cobblestone pattern) present. No pharyngeal swelling or oropharyngeal exudate.  Eyes:     General: Lids are normal.        Right eye: No discharge.        Left eye: No discharge.     Conjunctiva/sclera: Conjunctivae normal.     Pupils: Pupils are equal, round, and reactive to light.  Neck:     Thyroid: No thyromegaly.     Vascular: No carotid bruit.  Cardiovascular:     Rate and Rhythm: Normal rate and regular rhythm.     Heart sounds: Normal heart sounds. No murmur heard.    No gallop.  Pulmonary:     Effort: Pulmonary effort is normal. No accessory muscle usage or respiratory distress.     Breath sounds: Normal breath sounds.  Abdominal:     General: Bowel sounds are normal.     Palpations: Abdomen is soft.  Musculoskeletal:     Cervical back: Normal range of motion and neck supple.     Right lower leg: No edema.     Left lower leg: No edema.  Skin:    General: Skin is warm and dry.  Neurological:     Mental Status: She is alert and oriented to person, place, and time.  Psychiatric:        Attention and Perception: Attention normal.        Mood and Affect: Mood normal.        Speech: Speech normal.        Behavior: Behavior normal. Behavior is cooperative.        Thought Content: Thought content normal.    Results for orders placed or performed in visit on  01/26/22  T4, free  Result Value Ref Range   Free T4 1.88 (H) 0.82 - 1.77 ng/dL  TSH  Result Value Ref Range   TSH 3.820 0.450 - 4.500 uIU/mL  Lipid Panel w/o Chol/HDL Ratio  Result Value Ref Range   Cholesterol, Total 208 (H) 100 - 199 mg/dL   Triglycerides 93 0 - 149 mg/dL   HDL 62 >39 mg/dL   VLDL Cholesterol Cal 17 5 - 40 mg/dL   LDL Chol Calc (NIH) 129 (H) 0 - 99 mg/dL      Assessment & Plan:   Problem List Items Addressed This Visit       Respiratory   Frontal sinusitis    Acute for several weeks now, with no improvement.  Will send in Azithromycin and Prednisone burst + recommend she continue OTC regimen.  Have recommended ENT visit due to sinus issues with frequent flares, but she refuses today.  Recommend: - Increased rest - Increasing Fluids - Acetaminophen as needed for fever/pain.  - Salt water gargling, chloraseptic spray and throat lozenges - Mucinex.  - Humidifying the air. Return for worsening or ongoing symptoms.      Relevant Medications   predniSONE (DELTASONE) 20 MG tablet   azithromycin (ZITHROMAX) 250 MG tablet     Endocrine   Hashimoto's thyroiditis - Primary    Ongoing, started on Levothyroxine and overall tolerating, although misses occasional doses.  Continue current medication regimen and adjust as needed.  Recheck labs today.      Relevant Orders   T4, free   TSH     Other   Chronic anxiety    Chronic, ongoing for years and stable with Xanax.  Denies SI/HI.  Has been on Xanax for several years.  Refills sent in today #  30 with 2 refills.  Return in 3 months.  Tried Buspar in past, but reports this made her dizzy.  UDS due next 10/29/22.  Has contract on file.      Relevant Medications   ALPRAZolam (XANAX) 0.5 MG tablet   Long-term current use of benzodiazepine    Refer to chronic anxiety plan of care.        Follow up plan: Return in about 3 months (around 07/29/2022) for ANXIETY.

## 2022-04-28 NOTE — Assessment & Plan Note (Signed)
Ongoing, started on Levothyroxine and overall tolerating, although misses occasional doses.  Continue current medication regimen and adjust as needed.  Recheck labs today. 

## 2022-04-28 NOTE — Assessment & Plan Note (Signed)
Chronic, ongoing for years and stable with Xanax.  Denies SI/HI.  Has been on Xanax for several years.  Refills sent in today #30 with 2 refills.  Return in 3 months.  Tried Buspar in past, but reports this made her dizzy.  UDS due next 10/29/22.  Has contract on file.

## 2022-04-28 NOTE — Assessment & Plan Note (Signed)
Acute for several weeks now, with no improvement.  Will send in Azithromycin and Prednisone burst + recommend she continue OTC regimen.  Have recommended ENT visit due to sinus issues with frequent flares, but she refuses today.  Recommend: - Increased rest - Increasing Fluids - Acetaminophen as needed for fever/pain.  - Salt water gargling, chloraseptic spray and throat lozenges - Mucinex.  - Humidifying the air. Return for worsening or ongoing symptoms.

## 2022-04-29 LAB — T4, FREE: Free T4: 1.55 ng/dL (ref 0.82–1.77)

## 2022-04-29 LAB — TSH: TSH: 3.98 u[IU]/mL (ref 0.450–4.500)

## 2022-04-29 NOTE — Progress Notes (Signed)
Please let Brandy Chen know her thyroid labs remain in stable range, to continue Levothyroxine dose daily as it is offering her benefit.  Have a wonderful day!!

## 2022-05-18 ENCOUNTER — Other Ambulatory Visit: Payer: Self-pay | Admitting: Nurse Practitioner

## 2022-05-18 NOTE — Telephone Encounter (Signed)
Requested medication (s) are due for refill today: no  Requested medication (s) are on the active medication list: yes  Last refill:  04/28/22 #30/2  Future visit scheduled: yes  Notes to clinic:  Unable to refill per protocol, cannot delegate.    Requested Prescriptions  Pending Prescriptions Disp Refills   ALPRAZolam (XANAX) 0.5 MG tablet [Pharmacy Med Name: ALPRAZOLAM 0.5 MG TABLET] 30 tablet 2    Sig: TAKE 1 TABLET BY MOUTH EVERYDAY AT BEDTIME     Not Delegated - Psychiatry: Anxiolytics/Hypnotics 2 Failed - 05/18/2022 10:35 AM      Failed - This refill cannot be delegated      Passed - Urine Drug Screen completed in last 360 days      Passed - Patient is not pregnant      Passed - Valid encounter within last 6 months    Recent Outpatient Visits           2 weeks ago Hashimoto's thyroiditis   [place], Barbaraann Faster, NP   3 months ago Primary hypertension   [place], [place] T, NP   3 months ago Sore throat   [place] Kathrine Haddock, NP   5 months ago Hashimoto's thyroiditis   [place], [place] T, NP   6 months ago Chronic anxiety   [place], Barbaraann Faster, NP       Future Appointments             In 2 months [place], Barbaraann Faster, NP [facility]

## 2022-05-21 ENCOUNTER — Encounter: Payer: Self-pay | Admitting: Nurse Practitioner

## 2022-07-06 DIAGNOSIS — J302 Other seasonal allergic rhinitis: Secondary | ICD-10-CM | POA: Diagnosis not present

## 2022-07-06 DIAGNOSIS — H6122 Impacted cerumen, left ear: Secondary | ICD-10-CM | POA: Diagnosis not present

## 2022-07-06 DIAGNOSIS — R040 Epistaxis: Secondary | ICD-10-CM | POA: Diagnosis not present

## 2022-07-06 DIAGNOSIS — J342 Deviated nasal septum: Secondary | ICD-10-CM | POA: Diagnosis not present

## 2022-07-26 NOTE — Patient Instructions (Signed)

## 2022-07-29 ENCOUNTER — Ambulatory Visit (INDEPENDENT_AMBULATORY_CARE_PROVIDER_SITE_OTHER): Payer: Medicare HMO | Admitting: Nurse Practitioner

## 2022-07-29 ENCOUNTER — Encounter: Payer: Self-pay | Admitting: Nurse Practitioner

## 2022-07-29 VITALS — BP 138/80 | HR 57 | Temp 97.7°F | Ht 59.0 in | Wt 93.5 lb

## 2022-07-29 DIAGNOSIS — F419 Anxiety disorder, unspecified: Secondary | ICD-10-CM

## 2022-07-29 DIAGNOSIS — Z79899 Other long term (current) drug therapy: Secondary | ICD-10-CM

## 2022-07-29 DIAGNOSIS — Z23 Encounter for immunization: Secondary | ICD-10-CM

## 2022-07-29 MED ORDER — ALPRAZOLAM 0.5 MG PO TABS: ORAL_TABLET | ORAL | 2 refills | Status: DC

## 2022-07-29 NOTE — Assessment & Plan Note (Signed)
Chronic, ongoing for years and stable with Xanax.  Denies SI/HI.  Has been on Xanax for several years, started by previous PCP.  Refills sent in today #30 with 2 refills.  Return in 3 months.  Tried Buspar in past, but reports this made her dizzy.  UDS due next 10/29/22.  Has contract on file.

## 2022-07-29 NOTE — Progress Notes (Signed)
BP 138/80   Pulse (!) 57   Temp 97.7 F (36.5 C) (Oral)   Ht '4\' 11"'$  (1.499 m)   Wt 93 lb 8 oz (42.4 kg)   SpO2 99%   BMI 18.88 kg/m    Subjective:    Patient ID: Brandy Chen, female    DOB: Sep 07, 1951, 71 y.o.   MRN: [id number]  HPI: Brandy Chen is a 71 y.o. female  Chief Complaint  Patient presents with   Anxiety    Patient is here for a three month follow up on Anxiety. Patient denies having any concerns at today's visit.    ANXIETY/STRESS Has been on Xanax at bedtime for long period, years.  Taking 1/2 tablet Xanax during day and 1/2 at night.  Tried Sertraline, but reports this was not successful + multiple other medications. Tried Buspar, but this caused vertigo.  Pt is aware of risks of benzo medication use to include increased sedation, respiratory suppression, falls, dependence and cardiovascular events.  Pt would like to continue treatment as benefit determine to outweigh risk. This works for her per her report and helps her mood.  Reviewed BEERS criteria with her. PDMP last fill 07/21/22.    Duration:stable Anxious mood: yes Excessive worrying: yes Irritability: no  Sweating: no Nausea: no Palpitations:no Hyperventilation: no Panic attacks: no Agoraphobia: no  Obscessions/compulsions: no Depressed mood: no    08/06/22   10:45 AM 04/28/2022   10:52 AM 03/16/2022   10:40 AM 01/26/2022   10:43 AM 01/19/2022   11:29 AM  Depression screen PHQ 2/9  Decreased Interest 0 0 1 0 0  Down, Depressed, Hopeless 0 0 1 0 0  PHQ - 2 Score 0 0 2 0 0  Altered sleeping 0 0 '1 1 1  '$ Tired, decreased energy 0 0 2 0 0  Change in appetite 0 0 1 0 0  Feeling bad or failure about yourself  0 0 1 0 0  Trouble concentrating 0 0 0 0 0  Moving slowly or fidgety/restless 0 0 0 0 0  Suicidal thoughts 0 0 0 0 0  PHQ-9 Score 0 0 '7 1 1  '$ Difficult doing work/chores Not difficult at all Not difficult at all Not difficult at all    Anhedonia: no Weight changes: no Insomnia:  none Hypersomnia: no Fatigue/loss of energy: no Feelings of worthlessness: no Feelings of guilt: no Impaired concentration/indecisiveness: no Suicidal ideations: no  Crying spells: no Recent Stressors/Life Changes: no   Relationship problems: no   Family stress: no     Financial stress: no    Job stress: no    Recent death/loss: no     08-06-2022   10:45 AM 04/28/2022   10:52 AM 01/26/2022   10:43 AM 12/04/2021    9:47 AM  GAD 7 : Generalized Anxiety Score  Nervous, Anxious, on Edge 0 0 0 0  Control/stop worrying 0 0 0 0  Worry too much - different things 0 0 0 0  Trouble relaxing 0 0 0 1  Restless 0 0 0 0  Easily annoyed or irritable 0 0 0 0  Afraid - awful might happen 0 0 0 0  Total GAD 7 Score 0 0 0 1  Anxiety Difficulty Not difficult at all Not difficult at all Not difficult at all Not difficult at all     Relevant past medical, surgical, family and social history reviewed and updated as indicated. Interim medical history since our last visit reviewed. Allergies and  medications reviewed and updated.  Review of Systems  Constitutional:  Negative for activity change, appetite change, diaphoresis, fatigue and fever.  Respiratory:  Negative for cough, chest tightness and shortness of breath.   Cardiovascular:  Negative for chest pain, palpitations and leg swelling.  Gastrointestinal: Negative.   Neurological: Negative.   Psychiatric/Behavioral: Negative.     Per HPI unless specifically indicated above     Objective:    BP 138/80   Pulse (!) 57   Temp 97.7 F (36.5 C) (Oral)   Ht '4\' 11"'$  (1.499 m)   Wt 93 lb 8 oz (42.4 kg)   SpO2 99%   BMI 18.88 kg/m   Wt Readings from Last 3 Encounters:  07/29/22 93 lb 8 oz (42.4 kg)  04/28/22 92 lb 6.4 oz (41.9 kg)  01/26/22 93 lb 12.8 oz (42.5 kg)    Physical Exam Vitals and nursing note reviewed.  Constitutional:      General: She is awake. She is not in acute distress.    Appearance: She is well-developed. She is not  ill-appearing.  HENT:     Head: Normocephalic.     Right Ear: Hearing, ear canal and external ear normal.     Left Ear: Hearing, ear canal and external ear normal.  Eyes:     General: Lids are normal.        Right eye: No discharge.        Left eye: No discharge.     Conjunctiva/sclera: Conjunctivae normal.     Pupils: Pupils are equal, round, and reactive to light.  Neck:     Thyroid: No thyromegaly.     Vascular: No carotid bruit.  Cardiovascular:     Rate and Rhythm: Normal rate and regular rhythm.     Heart sounds: Normal heart sounds. No murmur heard.    No gallop.  Pulmonary:     Effort: Pulmonary effort is normal. No accessory muscle usage or respiratory distress.     Breath sounds: Normal breath sounds.  Abdominal:     General: Bowel sounds are normal.     Palpations: Abdomen is soft.  Musculoskeletal:     Cervical back: Normal range of motion and neck supple.     Right lower leg: No edema.     Left lower leg: No edema.  Skin:    General: Skin is warm and dry.  Neurological:     Mental Status: She is alert and oriented to person, place, and time.  Psychiatric:        Attention and Perception: Attention normal.        Mood and Affect: Mood normal.        Behavior: Behavior normal. Behavior is cooperative.        Thought Content: Thought content normal.        Judgment: Judgment normal.    Results for orders placed or performed in visit on 05/21/22  HM MAMMOGRAPHY  Result Value Ref Range   HM Mammogram 0-4 Bi-Rad 0-4 Bi-Rad, Self Reported Normal      Assessment & Plan:   Problem List Items Addressed This Visit       Other   Chronic anxiety - Primary    Chronic, ongoing for years and stable with Xanax.  Denies SI/HI.  Has been on Xanax for several years, started by previous PCP.  Refills sent in today #30 with 2 refills.  Return in 3 months.  Tried Buspar in past, but reports this made her dizzy.  UDS due next 10/29/22.  Has contract on file.      Relevant  Medications   ALPRAZolam (XANAX) 0.5 MG tablet (Start on 08/19/2022)   Long-term current use of benzodiazepine    Refer to chronic anxiety plan of care.      Other Visit Diagnoses     Flu vaccine need       Flu vaccine in office today.   Relevant Orders   Flu Vaccine QUAD High Dose(Fluad) (Completed)        Follow up plan: Return in about 3 months (around 10/29/2022) for ANXIETY, HLD THYROID, VIT D, OSTEOPOROSIS.

## 2022-07-29 NOTE — Assessment & Plan Note (Signed)
Refer to chronic anxiety plan of care. 

## 2022-08-11 ENCOUNTER — Encounter: Payer: Self-pay | Admitting: Nurse Practitioner

## 2022-08-11 ENCOUNTER — Ambulatory Visit (INDEPENDENT_AMBULATORY_CARE_PROVIDER_SITE_OTHER): Payer: Medicare HMO | Admitting: Nurse Practitioner

## 2022-08-11 VITALS — BP 138/76 | HR 71 | Temp 97.4°F | Wt 92.8 lb

## 2022-08-11 DIAGNOSIS — R8281 Pyuria: Secondary | ICD-10-CM | POA: Diagnosis not present

## 2022-08-11 DIAGNOSIS — R399 Unspecified symptoms and signs involving the genitourinary system: Secondary | ICD-10-CM | POA: Insufficient documentation

## 2022-08-11 LAB — URINALYSIS, ROUTINE W REFLEX MICROSCOPIC
Bilirubin, UA: NEGATIVE
Glucose, UA: NEGATIVE
Ketones, UA: NEGATIVE
Leukocytes,UA: NEGATIVE
Nitrite, UA: NEGATIVE
Protein,UA: NEGATIVE
Specific Gravity, UA: 1.015 (ref 1.005–1.030)
Urobilinogen, Ur: 0.2 mg/dL (ref 0.2–1.0)
pH, UA: 7.5 (ref 5.0–7.5)

## 2022-08-11 LAB — MICROSCOPIC EXAMINATION

## 2022-08-11 LAB — WET PREP FOR TRICH, YEAST, CLUE
Clue Cell Exam: NEGATIVE
Trichomonas Exam: NEGATIVE
Yeast Exam: NEGATIVE

## 2022-08-11 MED ORDER — AMOXICILLIN-POT CLAVULANATE 875-125 MG PO TABS: 1.0000 | ORAL_TABLET | Freq: Two times a day (BID) | ORAL | 0 refills | Status: AC

## 2022-08-11 NOTE — Patient Instructions (Signed)
Urinary Tract Infection, Adult A urinary tract infection (UTI) is an infection of any part of the urinary tract. The urinary tract includes: The kidneys. The ureters. The bladder. The urethra. These organs make, store, and get rid of pee (urine) in the body. What are the causes? This infection is caused by germs (bacteria) in your genital area. These germs grow and cause swelling (inflammation) of your urinary tract. What increases the risk? The following factors may make you more likely to develop this condition: Using a small, thin tube (catheter) to drain pee. Not being able to control when you pee or poop (incontinence). Being female. If you are female, these things can increase the risk: Using these methods to prevent pregnancy: A medicine that kills sperm (spermicide). A device that blocks sperm (diaphragm). Having low levels of a female hormone (estrogen). Being pregnant. You are more likely to develop this condition if: You have genes that add to your risk. You are sexually active. You take antibiotic medicines. You have trouble peeing because of: A prostate that is bigger than normal, if you are female. A blockage in the part of your body that drains pee from the bladder. A kidney stone. A nerve condition that affects your bladder. Not getting enough to drink. Not peeing often enough. You have other conditions, such as: Diabetes. A weak disease-fighting system (immune system). Sickle cell disease. Gout. Injury of the spine. What are the signs or symptoms? Symptoms of this condition include: Needing to pee right away. Peeing small amounts often. Pain or burning when peeing. Blood in the pee. Pee that smells bad or not like normal. Trouble peeing. Pee that is cloudy. Fluid coming from the vagina, if you are female. Pain in the belly or lower back. Other symptoms include: Vomiting. Not feeling hungry. Feeling mixed up (confused). This may be the first symptom in  older adults. Being tired and grouchy (irritable). A fever. Watery poop (diarrhea). How is this treated? Taking antibiotic medicine. Taking other medicines. Drinking enough water. In some cases, you may need to see a specialist. Follow these instructions at home:  Medicines Take over-the-counter and prescription medicines only as told by your doctor. If you were prescribed an antibiotic medicine, take it as told by your doctor. Do not stop taking it even if you start to feel better. General instructions Make sure you: Pee until your bladder is empty. Do not hold pee for a long time. Empty your bladder after sex. Wipe from front to back after peeing or pooping if you are a female. Use each tissue one time when you wipe. Drink enough fluid to keep your pee pale yellow. Keep all follow-up visits. Contact a doctor if: You do not get better after 1-2 days. Your symptoms go away and then come back. Get help right away if: You have very bad back pain. You have very bad pain in your lower belly. You have a fever. You have chills. You feeling like you will vomit or you vomit. Summary A urinary tract infection (UTI) is an infection of any part of the urinary tract. This condition is caused by germs in your genital area. There are many risk factors for a UTI. Treatment includes antibiotic medicines. Drink enough fluid to keep your pee pale yellow. This information is not intended to replace advice given to you by your health care provider. Make sure you discuss any questions you have with your health care provider. Document Revised: 04/25/2020 Document Reviewed: 04/25/2020 Elsevier Patient Education    2023 [employer]  

## 2022-08-11 NOTE — Progress Notes (Signed)
Acute Office Visit  Subjective:     Patient ID: Brandy Chen, female    DOB: 18-Apr-1951, 71 y.o.   MRN: [id number]  Chief Complaint  Patient presents with   Urinary Tract Infection    Pt states she has been having low back pain and urinary urgency for the last week or so. States she has had a little bit of pain and pressure with urinating as well. Has been taking cranberry tablets.     Has had urinary symptoms for 3 weeks, did test from drugstore and it noted possible infection.  Has to urinate a lot + having a little bit of back pain.  Feels out of sorts.  Has not had a urine infection in some time.    Urinary Tract Infection  This is a new problem. The current episode started 1 to 4 weeks ago. The problem occurs intermittently. The problem has been unchanged. The quality of the pain is described as aching. The pain is mild. There has been no fever. She is Not sexually active. There is No history of pyelonephritis. Associated symptoms include frequency and urgency. Pertinent negatives include no chills, discharge, flank pain, hematuria, hesitancy, nausea, sweats or vomiting. She has tried increased fluids (cranberry pills) for the symptoms. The treatment provided mild relief. There is no history of catheterization, kidney stones or recurrent UTIs.   Patient is in today for urinary symptoms for a few weeks.  Review of Systems  Constitutional:  Positive for malaise/fatigue. Negative for chills and fever.  Respiratory: Negative.    Cardiovascular: Negative.   Gastrointestinal:  Negative for nausea and vomiting.  Genitourinary:  Positive for dysuria, frequency and urgency. Negative for flank pain, hematuria and hesitancy.  Neurological: Negative.   Psychiatric/Behavioral: Negative.        Objective:    BP 138/76   Pulse 71   Temp (!) 97.4 F (36.3 C) (Oral)   Wt 92 lb 12.8 oz (42.1 kg)   SpO2 95%   BMI 18.74 kg/m  BP Readings from Last 3 Encounters:  08/11/22 138/76   07/29/22 138/80  04/28/22 132/72   Wt Readings from Last 3 Encounters:  08/11/22 92 lb 12.8 oz (42.1 kg)  07/29/22 93 lb 8 oz (42.4 kg)  04/28/22 92 lb 6.4 oz (41.9 kg)   Physical Exam Vitals and nursing note reviewed.  Constitutional:      General: She is awake. She is not in acute distress.    Appearance: She is well-developed. She is not ill-appearing.  HENT:     Head: Normocephalic.     Right Ear: Hearing, ear canal and external ear normal.     Left Ear: Hearing, ear canal and external ear normal.  Eyes:     General: Lids are normal.        Right eye: No discharge.        Left eye: No discharge.     Conjunctiva/sclera: Conjunctivae normal.     Pupils: Pupils are equal, round, and reactive to light.  Neck:     Thyroid: No thyromegaly.     Vascular: No carotid bruit.  Cardiovascular:     Rate and Rhythm: Normal rate and regular rhythm.     Heart sounds: Normal heart sounds. No murmur heard.    No gallop.  Pulmonary:     Effort: Pulmonary effort is normal. No accessory muscle usage or respiratory distress.     Breath sounds: Normal breath sounds.  Abdominal:     General:  Bowel sounds are normal.     Palpations: Abdomen is soft.  Musculoskeletal:     Cervical back: Normal range of motion and neck supple.     Right lower leg: No edema.     Left lower leg: No edema.  Skin:    General: Skin is warm and dry.  Neurological:     Mental Status: She is alert and oriented to person, place, and time.  Psychiatric:        Attention and Perception: Attention normal.        Mood and Affect: Mood normal.        Behavior: Behavior normal. Behavior is cooperative.        Thought Content: Thought content normal.        Judgment: Judgment normal.    No results found for any visits on 08/11/22.     Assessment & Plan:   Problem List Items Addressed This Visit       Other   Urinary symptom or sign - Primary    Acute for 3 weeks, has been self treating at home but continues  with symptoms.  UA noting trace blood and few bacteria.  Wet prep negative.  She is having ongoing symptoms and discomfort, will send for culture and start Augmentin BID for 5 days due to ongoing symptoms.  Recommend she take this with yogurt daily.  If culture shows no growth will stop abx, discussed with her. Return for worsening or ongoing.      Relevant Orders   Urinalysis, Routine w reflex microscopic   WET PREP FOR TRICH, YEAST, CLUE   Urine Culture   Other Visit Diagnoses     Pyuria       Urine sent for culture.   Relevant Medications   amoxicillin-clavulanate (AUGMENTIN) 875-125 MG tablet       Meds ordered this encounter  Medications   amoxicillin-clavulanate (AUGMENTIN) 875-125 MG tablet    Sig: Take 1 tablet by mouth 2 (two) times daily for 5 days.    Dispense:  10 tablet    Refill:  0    Return if symptoms worsen or fail to improve.  Venita Lick, NP

## 2022-08-11 NOTE — Assessment & Plan Note (Signed)
Acute for 3 weeks, has been self treating at home but continues with symptoms.  UA noting trace blood and few bacteria.  Wet prep negative.  She is having ongoing symptoms and discomfort, will send for culture and start Augmentin BID for 5 days due to ongoing symptoms.  Recommend she take this with yogurt daily.  If culture shows no growth will stop abx, discussed with her. Return for worsening or ongoing.

## 2022-08-13 LAB — URINE CULTURE

## 2022-08-13 NOTE — Progress Notes (Signed)
Please let Bonnee know her urine returned showing no infection and she can stop antibiotic therapy at this time.  Any questions?  Have a great weekend.

## 2022-08-21 ENCOUNTER — Other Ambulatory Visit: Payer: Self-pay | Admitting: Nurse Practitioner

## 2022-08-24 NOTE — Telephone Encounter (Signed)
Requested medication (s) are due for refill today:   Provider to review  Requested medication (s) are on the active medication list:   Yes  Future visit scheduled:   Yes   Last ordered: 08/19/2022  #30, 2 refills  Non delegated refill   Requested Prescriptions  Pending Prescriptions Disp Refills   ALPRAZolam (XANAX) 0.5 MG tablet [Pharmacy Med Name: ALPRAZOLAM 0.5 MG TABLET] 30 tablet 2    Sig: TAKE 1 TABLET BY MOUTH EVERYDAY AT BEDTIME     Not Delegated - Psychiatry: Anxiolytics/Hypnotics 2 Failed - 08/21/2022  9:29 AM      Failed - This refill cannot be delegated      Passed - Urine Drug Screen completed in last 360 days      Passed - Patient is not pregnant      Passed - Valid encounter within last 6 months    Recent Outpatient Visits           1 week ago Urinary symptom or sign   [place], Barbaraann Faster, NP   3 weeks ago Chronic anxiety   [place], [place] T, NP   3 months ago Hashimoto's thyroiditis   [place], Barbaraann Faster, NP   7 months ago Primary hypertension   [place], [place] T, NP   7 months ago Sore throat   [facility] Kathrine Haddock, NP       Future Appointments             In 2 months Cannady, Barbaraann Faster, NP [employer], [facility]

## 2022-11-02 ENCOUNTER — Ambulatory Visit: Payer: Medicare HMO | Admitting: Nurse Practitioner

## 2022-11-03 ENCOUNTER — Ambulatory Visit: Payer: Medicare HMO | Admitting: Nurse Practitioner

## 2022-11-14 NOTE — Patient Instructions (Signed)

## 2022-11-15 ENCOUNTER — Other Ambulatory Visit: Payer: Self-pay | Admitting: Nurse Practitioner

## 2022-11-16 NOTE — Telephone Encounter (Signed)
Requested Prescriptions  Pending Prescriptions Disp Refills   levothyroxine (SYNTHROID) 25 MCG tablet [Pharmacy Med Name: LEVOTHYROXINE 25 MCG TABLET] 90 tablet 0    Sig: TAKE 1 TABLET BY MOUTH EVERY DAY     Endocrinology:  Hypothyroid Agents Passed - 11/15/2022  1:29 AM      Passed - TSH in normal range and within 360 days    TSH  Date Value Ref Range Status  04/28/2022 3.980 0.450 - 4.500 uIU/mL Final         Passed - Valid encounter within last 12 months    Recent Outpatient Visits           3 months ago Urinary symptom or sign   [place], [place] T, NP   3 months ago Chronic anxiety   [place], [place] T, NP   6 months ago Hashimoto's thyroiditis   [place], [place] T, NP   9 months ago Primary hypertension   [place], [place] T, NP   10 months ago Sore throat   [place] Kathrine Haddock, NP       Future Appointments             In 2 days Venita Lick, NP [place], [place]

## 2022-11-18 ENCOUNTER — Encounter: Payer: Self-pay | Admitting: Nurse Practitioner

## 2022-11-18 ENCOUNTER — Ambulatory Visit (INDEPENDENT_AMBULATORY_CARE_PROVIDER_SITE_OTHER): Payer: Medicare HMO | Admitting: Nurse Practitioner

## 2022-11-18 VITALS — BP 121/69 | HR 71 | Temp 97.7°F | Ht 59.02 in | Wt 94.7 lb

## 2022-11-18 DIAGNOSIS — R8281 Pyuria: Secondary | ICD-10-CM

## 2022-11-18 DIAGNOSIS — E7800 Pure hypercholesterolemia, unspecified: Secondary | ICD-10-CM | POA: Diagnosis not present

## 2022-11-18 DIAGNOSIS — E063 Autoimmune thyroiditis: Secondary | ICD-10-CM | POA: Diagnosis not present

## 2022-11-18 DIAGNOSIS — R350 Frequency of micturition: Secondary | ICD-10-CM | POA: Diagnosis not present

## 2022-11-18 DIAGNOSIS — F419 Anxiety disorder, unspecified: Secondary | ICD-10-CM

## 2022-11-18 DIAGNOSIS — I10 Essential (primary) hypertension: Secondary | ICD-10-CM | POA: Diagnosis not present

## 2022-11-18 DIAGNOSIS — Z79899 Other long term (current) drug therapy: Secondary | ICD-10-CM | POA: Diagnosis not present

## 2022-11-18 LAB — MICROSCOPIC EXAMINATION: Bacteria, UA: NONE SEEN

## 2022-11-18 LAB — URINALYSIS, ROUTINE W REFLEX MICROSCOPIC
Bilirubin, UA: NEGATIVE
Glucose, UA: NEGATIVE
Ketones, UA: NEGATIVE
Leukocytes,UA: NEGATIVE
Nitrite, UA: NEGATIVE
Protein,UA: NEGATIVE
Specific Gravity, UA: 1.01 (ref 1.005–1.030)
Urobilinogen, Ur: 0.2 mg/dL (ref 0.2–1.0)
pH, UA: 5.5 (ref 5.0–7.5)

## 2022-11-18 MED ORDER — LEVOTHYROXINE SODIUM 25 MCG PO TABS: 25.0000 ug | ORAL_TABLET | Freq: Every day | ORAL | 4 refills | Status: DC

## 2022-11-18 MED ORDER — ALPRAZOLAM 0.5 MG PO TABS: ORAL_TABLET | ORAL | 2 refills | Status: DC

## 2022-11-18 NOTE — Assessment & Plan Note (Signed)
Chronic, ongoing for years and stable with Xanax.  Denies SI/HI.  Has been on Xanax for several years, started by previous PCP.  Refills sent in today #30 with 2 refills.  Return in 3 months.  Tried Buspar in past, but reports this made her dizzy.  UDS due today and obtained.  Has contract on file.

## 2022-11-18 NOTE — Assessment & Plan Note (Signed)
Ongoing, started on Levothyroxine and overall tolerating, although misses occasional doses.  Continue current medication regimen and adjust as needed.  Recheck labs today.

## 2022-11-18 NOTE — Assessment & Plan Note (Signed)
Chronic, ongoing.  She never took Rosuvastatin ordered due to concerns about side effects -- again discussed with patient today the benefit of taking this.  Obtain lipid today and continue to recommend diet focus + highly recommend statin therapy.

## 2022-11-18 NOTE — Assessment & Plan Note (Signed)
Refer to chronic anxiety plan of care.

## 2022-11-18 NOTE — Assessment & Plan Note (Signed)
Chronic, stable.  BP at goal for age without medication.  Continue current diet control.  Consider Lisinopril if elevation in BP >130/80 consistently at home and office.  DASH Diet focus recommended.  LABS: CBC and CMP.

## 2022-11-18 NOTE — Progress Notes (Signed)
BP 121/69   Pulse 71   Temp 97.7 F (36.5 C) (Oral)   Ht 4' 11.02" (1.499 m)   Wt 94 lb 11.2 oz (43 kg)   SpO2 97%   BMI 19.12 kg/m    Subjective:    Patient ID: Brandy Chen, female    DOB: 18-Oct-1950, 72 y.o.   MRN: [id number]  HPI: Brandy Chen is a 72 y.o. female  Chief Complaint  Patient presents with   Hypertension   Hashimoto's Thyroiditis   Urinary Frequency    For past week    Long term current use of Benzodiazepine   HYPERTENSION  No current medications.  No current statin, she continues fish oil.  Hypertension status: controlled  BP monitoring frequency:  a few times a month BP range: 120/70 range at home Aspirin: no Recurrent headaches: no Visual changes: no Palpitations: no Dyspnea: no Chest pain: no Lower extremity edema: no Dizzy/lightheaded: no  The 10-year ASCVD risk score (Arnett DK, et al., 2019) is: 11.7%   Values used to calculate the score:     Age: 29 years     Sex: Female     Is Non-Hispanic African American: No     Diabetic: No     Tobacco smoker: No     Systolic Blood Pressure: 123XX123 mmHg     Is BP treated: No     HDL Cholesterol: 62 mg/dL     Total Cholesterol: 208 mg/dL   HYPOTHYROIDISM Continues on Levothyroxine 25 MCG.  Misses a dose occasionally. Thyroid control status:stable Satisfied with current treatment? yes Medication side effects: no Medication compliance: good compliance Etiology of hypothyroidism:  Recent dose adjustment:no Fatigue: no Cold intolerance: no Heat intolerance: no Weight gain: no Weight loss: no Constipation: no Diarrhea/loose stools: no Palpitations: no Lower extremity edema: no Anxiety/depressed mood: no  URINARY SYMPTOMS Been present about one week. Dysuria: no Urinary frequency: yes Urgency: yes Small volume voids: no Symptom severity: yes Urinary incontinence: no Foul odor: yes Hematuria: no Abdominal pain: no Back pain:  yesterday Suprapubic pain/pressure: yes Flank pain:  no Fever:  no Vomiting: no Relief with cranberry juice: yes Status: stable Previous urinary tract infection: yes Recurrent urinary tract infection: no Sexual activity: No sexually active History of sexually transmitted disease: no Treatments attempted: cranberry and increasing fluids     ANXIETY/STRESS Has been on Xanax at bedtime for long period, years.  Taking 1/2 tablet Xanax during day and 1/2 at night.  Tried Sertraline, but reports this was not successful + multiple other medications. Tried Buspar, but this caused vertigo.  Pt is aware of risks of benzo medication use to include increased sedation, respiratory suppression, falls, dependence and cardiovascular events.  Pt would like to continue treatment as benefit determine to outweigh risk. This works for her per her report and helps her mood.  Reviewed BEERS criteria with her. PDMP last fill 10/25/22.    Duration:stable Anxious mood: yes Excessive worrying: yes Irritability: no  Sweating: no Nausea: no Palpitations:no Hyperventilation: no Panic attacks: no Agoraphobia: no  Obscessions/compulsions: no Depressed mood: no    11/18/2022   10:48 AM 07/29/2022   10:45 AM 04/28/2022   10:52 AM 03/16/2022   10:40 AM 01/26/2022   10:43 AM  Depression screen PHQ 2/9  Decreased Interest 0 0 0 1 0  Down, Depressed, Hopeless 0 0 0 1 0  PHQ - 2 Score 0 0 0 2 0  Altered sleeping 0 0 0 1 1  Tired,  decreased energy 1 0 0 2 0  Change in appetite 0 0 0 1 0  Feeling bad or failure about yourself  0 0 0 1 0  Trouble concentrating 0 0 0 0 0  Moving slowly or fidgety/restless 0 0 0 0 0  Suicidal thoughts 0 0 0 0 0  PHQ-9 Score 1 0 0 7 1  Difficult doing work/chores Not difficult at all Not difficult at all Not difficult at all Not difficult at all   Anhedonia: no Weight changes: no Insomnia: none Hypersomnia: no Fatigue/loss of energy: no Feelings of worthlessness: no Feelings of guilt: no Impaired concentration/indecisiveness:  no Suicidal ideations: no  Crying spells: no Recent Stressors/Life Changes: no   Relationship problems: no   Family stress: no     Financial stress: no    Job stress: no    Recent death/loss: no     11/21/2022   10:49 AM 07/29/2022   10:45 AM 04/28/2022   10:52 AM 01/26/2022   10:43 AM  GAD 7 : Generalized Anxiety Score  Nervous, Anxious, on Edge 0 0 0 0  Control/stop worrying 0 0 0 0  Worry too much - different things 0 0 0 0  Trouble relaxing 0 0 0 0  Restless 0 0 0 0  Easily annoyed or irritable 0 0 0 0  Afraid - awful might happen 0 0 0 0  Total GAD 7 Score 0 0 0 0  Anxiety Difficulty Not difficult at all Not difficult at all Not difficult at all Not difficult at all     Relevant past medical, surgical, family and social history reviewed and updated as indicated. Interim medical history since our last visit reviewed. Allergies and medications reviewed and updated.  Review of Systems  Constitutional:  Negative for activity change, appetite change, diaphoresis, fatigue and fever.  Respiratory:  Negative for cough, chest tightness and shortness of breath.   Cardiovascular:  Negative for chest pain, palpitations and leg swelling.  Gastrointestinal: Negative.   Neurological: Negative.   Psychiatric/Behavioral: Negative.     Per HPI unless specifically indicated above     Objective:    BP 121/69   Pulse 71   Temp 97.7 F (36.5 C) (Oral)   Ht 4' 11.02" (1.499 m)   Wt 94 lb 11.2 oz (43 kg)   SpO2 97%   BMI 19.12 kg/m   Wt Readings from Last 3 Encounters:  11-21-22 94 lb 11.2 oz (43 kg)  08/11/22 92 lb 12.8 oz (42.1 kg)  07/29/22 93 lb 8 oz (42.4 kg)    Physical Exam Vitals and nursing note reviewed.  Constitutional:      General: She is awake. She is not in acute distress.    Appearance: She is well-developed. She is not ill-appearing.  HENT:     Head: Normocephalic.     Right Ear: Hearing, ear canal and external ear normal.     Left Ear: Hearing, ear canal and  external ear normal.  Eyes:     General: Lids are normal.        Right eye: No discharge.        Left eye: No discharge.     Conjunctiva/sclera: Conjunctivae normal.     Pupils: Pupils are equal, round, and reactive to light.  Neck:     Thyroid: No thyromegaly.     Vascular: No carotid bruit.  Cardiovascular:     Rate and Rhythm: Normal rate and regular rhythm.  Heart sounds: Normal heart sounds. No murmur heard.    No gallop.  Pulmonary:     Effort: Pulmonary effort is normal. No accessory muscle usage or respiratory distress.     Breath sounds: Normal breath sounds.  Abdominal:     General: Bowel sounds are normal.     Palpations: Abdomen is soft.  Musculoskeletal:     Cervical back: Normal range of motion and neck supple.     Right lower leg: No edema.     Left lower leg: No edema.  Skin:    General: Skin is warm and dry.  Neurological:     Mental Status: She is alert and oriented to person, place, and time.  Psychiatric:        Attention and Perception: Attention normal.        Mood and Affect: Mood normal.        Behavior: Behavior normal. Behavior is cooperative.        Thought Content: Thought content normal.        Judgment: Judgment normal.    Results for orders placed or performed in visit on 08/11/22  WET PREP FOR [place], YEAST, CLUE   Specimen: Urine   Urine  Result Value Ref Range   Trichomonas Exam Negative Negative   Yeast Exam Negative Negative   Clue Cell Exam Negative Negative  Urine Culture   Specimen: Urine   UR  Result Value Ref Range   Urine Culture, Routine Final report    Organism ID, Bacteria Comment   Microscopic Examination   Urine  Result Value Ref Range   WBC, UA 0-5 0 - 5 /hpf   RBC, Urine 0-2 0 - 2 /hpf   Epithelial Cells (non renal) 0-10 0 - 10 /hpf   Bacteria, UA Few None seen/Few  Urinalysis, Routine w reflex microscopic  Result Value Ref Range   Specific Gravity, UA 1.015 1.005 - 1.030   pH, UA 7.5 5.0 - 7.5   Color, UA  Yellow Yellow   Appearance Ur Cloudy (A) Clear   Leukocytes,UA Negative Negative   Protein,UA Negative Negative/Trace   Glucose, UA Negative Negative   Ketones, UA Negative Negative   RBC, UA Trace (A) Negative   Bilirubin, UA Negative Negative   Urobilinogen, Ur 0.2 0.2 - 1.0 mg/dL   Nitrite, UA Negative Negative   Microscopic Examination See below:       Assessment & Plan:   Problem List Items Addressed This Visit       Cardiovascular and Mediastinum   Hypertension - Primary    Chronic, stable.  BP at goal for age without medication.  Continue current diet control.  Consider Lisinopril if elevation in BP >130/80 consistently at home and office.  DASH Diet focus recommended.  LABS: CBC and CMP.      Relevant Orders   CBC with Differential/Platelet     Endocrine   Hashimoto's thyroiditis    Ongoing, started on Levothyroxine and overall tolerating, although misses occasional doses.  Continue current medication regimen and adjust as needed.  Recheck labs today.      Relevant Medications   levothyroxine (SYNTHROID) 25 MCG tablet   Other Relevant Orders   TSH   T4, free     Other   Chronic anxiety    Chronic, ongoing for years and stable with Xanax.  Denies SI/HI.  Has been on Xanax for several years, started by previous PCP.  Refills sent in today #30 with 2 refills.  Return  in 3 months.  Tried Buspar in past, but reports this made her dizzy.  UDS due today and obtained.  Has contract on file.      Relevant Medications   ALPRAZolam (XANAX) 0.5 MG tablet (Start on 11/24/2022)   Other Relevant Orders   [id number] 11+Oxyco+Alc+Crt-Bund   TSH   T4, free   Frequency of urination    Acute for about one week.  UA with 1+ BLD only.  Will send for culture and treat as needed.  For now recommend increased water intake and continue cranberry tablets.      Relevant Orders   Urinalysis, Routine w reflex microscopic   Hypercholesteremia    Chronic, ongoing.  She never took  Rosuvastatin ordered due to concerns about side effects -- again discussed with patient today the benefit of taking this.  Obtain lipid today and continue to recommend diet focus + highly recommend statin therapy.      Relevant Orders   Comprehensive metabolic panel   Lipid Panel w/o Chol/HDL Ratio   Long-term current use of benzodiazepine    Refer to chronic anxiety plan of care.      Relevant Orders   [id number] 11+Oxyco+Alc+Crt-Bund   Other Visit Diagnoses     Pyuria       Urine sent for culture.   Relevant Orders   Urine Culture        Follow up plan: Return in about 3 months (around 02/16/2023) for [place].

## 2022-11-18 NOTE — Assessment & Plan Note (Signed)
Acute for about one week.  UA with 1+ BLD only.  Will send for culture and treat as needed.  For now recommend increased water intake and continue cranberry tablets.

## 2022-11-20 ENCOUNTER — Other Ambulatory Visit: Payer: Self-pay | Admitting: Nurse Practitioner

## 2022-11-20 DIAGNOSIS — E063 Autoimmune thyroiditis: Secondary | ICD-10-CM

## 2022-11-20 LAB — DRUG SCREEN 764883 11+OXYCO+ALC+CRT-BUND
Amphetamines, Urine: NEGATIVE ng/mL
BENZODIAZ UR QL: NEGATIVE ng/mL
Barbiturate: NEGATIVE ng/mL
Cannabinoid Quant, Ur: NEGATIVE ng/mL
Cocaine (Metabolite): NEGATIVE ng/mL
Creatinine: 31.6 mg/dL (ref 20.0–300.0)
Ethanol: NEGATIVE %
Meperidine: NEGATIVE ng/mL
Methadone Screen, Urine: NEGATIVE ng/mL
OPIATE SCREEN URINE: NEGATIVE ng/mL
Oxycodone/Oxymorphone, Urine: NEGATIVE ng/mL
Phencyclidine: NEGATIVE ng/mL
Propoxyphene: NEGATIVE ng/mL
Tramadol: NEGATIVE ng/mL
pH, Urine: 5.7 (ref 4.5–8.9)

## 2022-11-20 LAB — T4, FREE: Free T4: 1.71 ng/dL (ref 0.82–1.77)

## 2022-11-20 LAB — CBC WITH DIFFERENTIAL/PLATELET
Basophils Absolute: 0.1 10*3/uL (ref 0.0–0.2)
Basos: 1 %
EOS (ABSOLUTE): 0.1 10*3/uL (ref 0.0–0.4)
Eos: 2 %
Hematocrit: 40.3 % (ref 34.0–46.6)
Hemoglobin: 13.5 g/dL (ref 11.1–15.9)
Immature Grans (Abs): 0 10*3/uL (ref 0.0–0.1)
Immature Granulocytes: 0 %
Lymphocytes Absolute: 1.9 10*3/uL (ref 0.7–3.1)
Lymphs: 30 %
MCH: 30.5 pg (ref 26.6–33.0)
MCHC: 33.5 g/dL (ref 31.5–35.7)
MCV: 91 fL (ref 79–97)
Monocytes Absolute: 0.4 10*3/uL (ref 0.1–0.9)
Monocytes: 7 %
Neutrophils Absolute: 3.8 10*3/uL (ref 1.4–7.0)
Neutrophils: 60 %
Platelets: 238 10*3/uL (ref 150–450)
RBC: 4.43 x10E6/uL (ref 3.77–5.28)
RDW: 12.5 % (ref 11.7–15.4)
WBC: 6.2 10*3/uL (ref 3.4–10.8)

## 2022-11-20 LAB — TSH: TSH: 4.75 u[IU]/mL — ABNORMAL HIGH (ref 0.450–4.500)

## 2022-11-20 LAB — COMPREHENSIVE METABOLIC PANEL
ALT: 9 IU/L (ref 0–32)
AST: 16 IU/L (ref 0–40)
Albumin/Globulin Ratio: 2 (ref 1.2–2.2)
Albumin: 4.3 g/dL (ref 3.8–4.8)
Alkaline Phosphatase: 91 IU/L (ref 44–121)
BUN/Creatinine Ratio: 17 (ref 12–28)
BUN: 12 mg/dL (ref 8–27)
Bilirubin Total: 0.6 mg/dL (ref 0.0–1.2)
CO2: 23 mmol/L (ref 20–29)
Calcium: 9.2 mg/dL (ref 8.7–10.3)
Chloride: 101 mmol/L (ref 96–106)
Creatinine, Ser: 0.71 mg/dL (ref 0.57–1.00)
Globulin, Total: 2.2 g/dL (ref 1.5–4.5)
Glucose: 93 mg/dL (ref 70–99)
Potassium: 4.7 mmol/L (ref 3.5–5.2)
Sodium: 140 mmol/L (ref 134–144)
Total Protein: 6.5 g/dL (ref 6.0–8.5)
eGFR: 90 mL/min/{1.73_m2} (ref >59)

## 2022-11-20 LAB — LIPID PANEL W/O CHOL/HDL RATIO
Cholesterol, Total: 222 mg/dL — ABNORMAL HIGH (ref 100–199)
HDL: 78 mg/dL (ref >39)
LDL Chol Calc (NIH): 131 mg/dL — ABNORMAL HIGH (ref 0–99)
Triglycerides: 72 mg/dL (ref 0–149)
VLDL Cholesterol Cal: 13 mg/dL (ref 5–40)

## 2022-11-20 NOTE — Progress Notes (Signed)
Good morning, please let patient know labs have returned: - Cholesterol levels remain elevated and a statin would be beneficial for heart/brain protection, but I know you prefer focus on diet.  Please ensure more fish and chicken in diet. - Thyroid labs show mild elevation in TSH and Free T4 stable, I would like you to ensure you are taking Levothyroxine daily and would like to recheck this in 4 weeks via outpatient labs.  If TSH still elevated then we will increase Levothyroxine dosing.  Please schedule outpatient lab visit only for patient. - Remainder of labs all stable.  Any questions? Keep being amazing!!  Thank you for allowing me to participate in your care.  I appreciate you. Kindest regards, Jandi Swiger

## 2022-11-22 LAB — URINE CULTURE: Organism ID, Bacteria: NO GROWTH

## 2022-11-22 NOTE — Progress Notes (Signed)
Please alert Brandy Chen she has no infection in her urine on check.  Also reiterate the importance of trying the increase dose of Levothyroxine.  Alert her that for thyroid it is opposite of what you would think when it comes to labs = higher TSH level mean a more sluggish and slow thyroid, which means we need more thyroid medicine to help bump it up a little bit and lower TSH levels mean thyroid is more hyperactive and busy, which means we have to lower Levothyroxine dosing as it is too active.  So opposite of most other labs we think about.  Thank you:)

## 2022-11-25 ENCOUNTER — Other Ambulatory Visit: Payer: Self-pay | Admitting: Nurse Practitioner

## 2022-11-25 NOTE — Telephone Encounter (Signed)
Requested medication (s) are due for refill today -no  Requested medication (s) are on the active medication list -yes  Future visit scheduled -yes  Last refill: 11/24/22 #30 2RF   Notes to clinic: non delegated Rx  Requested Prescriptions  Pending Prescriptions Disp Refills   ALPRAZolam (XANAX) 0.5 MG tablet [Pharmacy Med Name: ALPRAZOLAM 0.5 MG TABLET] 30 tablet 2    Sig: TAKE 1 TABLET BY MOUTH EVERYDAY AT BEDTIME     Not Delegated - Psychiatry: Anxiolytics/Hypnotics 2 Failed - 11/25/2022  9:47 AM      Failed - This refill cannot be delegated      Passed - Urine Drug Screen completed in last 360 days      Passed - Patient is not pregnant      Passed - Valid encounter within last 6 months    Recent Outpatient Visits           1 week ago Primary hypertension   [place], [place] T, NP   3 months ago Urinary symptom or sign   [place], [place] T, NP   3 months ago Chronic anxiety   [place], [place] T, NP   7 months ago Hashimoto's thyroiditis   [place], [place] T, NP   10 months ago Primary hypertension   [place], Barbaraann Faster, NP       Future Appointments             In 2 months Cannady, Barbaraann Faster, NP [place]               Requested Prescriptions  Pending Prescriptions Disp Refills   ALPRAZolam (XANAX) 0.5 MG tablet [Pharmacy Med Name: ALPRAZOLAM 0.5 MG TABLET] 30 tablet 2    Sig: TAKE 1 TABLET BY MOUTH EVERYDAY AT BEDTIME     Not Delegated - Psychiatry: Anxiolytics/Hypnotics 2 Failed - 11/25/2022  9:47 AM      Failed - This refill cannot be delegated      Passed - Urine Drug Screen completed in last 360 days      Passed - Patient is not pregnant      Passed - Valid encounter within last 6 months    Recent Outpatient Visits           1 week ago  Primary hypertension   [place], [place] T, NP   3 months ago Urinary symptom or sign   [place], [place] T, NP   3 months ago Chronic anxiety   [place], [place] T, NP   7 months ago Hashimoto's thyroiditis   [place], [place] T, NP   10 months ago Primary hypertension   [place], Barbaraann Faster, NP       Future Appointments             In 2 months Cannady, Barbaraann Faster, NP [place], [facility]

## 2022-12-08 DIAGNOSIS — H43812 Vitreous degeneration, left eye: Secondary | ICD-10-CM | POA: Diagnosis not present

## 2022-12-08 DIAGNOSIS — H1045 Other chronic allergic conjunctivitis: Secondary | ICD-10-CM | POA: Diagnosis not present

## 2022-12-08 DIAGNOSIS — H2513 Age-related nuclear cataract, bilateral: Secondary | ICD-10-CM | POA: Diagnosis not present

## 2022-12-22 ENCOUNTER — Other Ambulatory Visit: Payer: Medicare HMO

## 2022-12-22 DIAGNOSIS — E063 Autoimmune thyroiditis: Secondary | ICD-10-CM

## 2022-12-23 ENCOUNTER — Other Ambulatory Visit: Payer: Self-pay | Admitting: Nurse Practitioner

## 2022-12-23 DIAGNOSIS — E063 Autoimmune thyroiditis: Secondary | ICD-10-CM

## 2022-12-23 LAB — TSH: TSH: 4.77 u[IU]/mL — ABNORMAL HIGH (ref 0.450–4.500)

## 2022-12-23 LAB — T4, FREE: Free T4: 1.37 ng/dL (ref 0.82–1.77)

## 2022-12-23 MED ORDER — LEVOTHYROXINE SODIUM 50 MCG PO TABS: 50.0000 ug | ORAL_TABLET | Freq: Every day | ORAL | 5 refills | Status: DC

## 2022-12-23 NOTE — Progress Notes (Signed)
Please let Brandy Chen know her thyroid labs continue to show a sluggish thyroid, moving slowly.  We need to increase Levothyroxine to 50 MCG daily and recheck labs in 6 weeks outpatient.  I would like her to stop 25 MCG dosing and start the 50 MCG dosing I sent into pharmacy.  Any questions?  Needs outpatient lab visit in 6 weeks please. Keep being amazing!!  Thank you for allowing me to participate in your care.  I appreciate you. Kindest regards, Joniel Graumann

## 2022-12-23 NOTE — Addendum Note (Signed)
Addended by: Marnee Guarneri T on: 12/23/2022 11:35 AM   Modules accepted: Orders

## 2023-01-12 ENCOUNTER — Ambulatory Visit: Payer: Self-pay

## 2023-01-12 NOTE — Telephone Encounter (Signed)
  Chief Complaint: medication problem  Symptoms: increased HR, head feeling funny, diarrhea, anxiety Frequency: since started levothyroxine 50 mcg on 12/27/22  Pertinent Negatives: NA Disposition: ED /[] Urgent Care (no appt availability in office) / Appointment(In office/virtual)/  [facility]/ Home Care/ Refused Recommended Disposition /[] [facility]/  Follow-up with PCP Additional Notes: Pt thinks that she unable to tolerate new dose of levothyroxine d/t sx above. She started new dose on 12/27/22 and having sx since then. Scheduled OV for 01/14/23 at 0920 with PCP. Advised pt if sx get worse before OV to call back and can send message to provider. Pt verbalized understanding and will continue 50 mcg dose until appt.   Summary: Heads feels funny, anxiety   The patient called in stating she cannot tolerate the increased dosage on her levothyroxine (SYNTHROID) 50 MCG tablet. She says her head feels funny and her anxiety level is through the roof. Please assist patient further.         Reason for Disposition . [1] Caller has URGENT medicine question about med that PCP or specialist prescribed AND [2] triager unable to answer question  Answer Assessment - Initial Assessment Questions 1. NAME of MEDICINE: "What medicine(s) are you calling about?"     Levothyroxine  2. QUESTION: "What is your question?" (e.g., double dose of medicine, side effect)     Having SE from new dose of rx  3. PRESCRIBER: "Who prescribed the medicine?" Reason: if prescribed by specialist, call should be referred to that group.     Jolene, NP 4. SYMPTOMS: "Do you have any symptoms?" If Yes, ask: "What symptoms are you having?"  "How bad are the symptoms (e.g., mild, moderate, severe)     HR increased, head feels funny, anxiety, diarrhea  Protocols used: Medication Question Call-A-AH

## 2023-01-14 ENCOUNTER — Ambulatory Visit: Payer: Medicare HMO | Admitting: Nurse Practitioner

## 2023-01-17 ENCOUNTER — Ambulatory Visit (INDEPENDENT_AMBULATORY_CARE_PROVIDER_SITE_OTHER): Payer: Medicare HMO | Admitting: Family Medicine

## 2023-01-17 ENCOUNTER — Encounter: Payer: Self-pay | Admitting: Family Medicine

## 2023-01-17 ENCOUNTER — Ambulatory Visit: Payer: Self-pay | Admitting: *Deleted

## 2023-01-17 DIAGNOSIS — K591 Functional diarrhea: Secondary | ICD-10-CM | POA: Insufficient documentation

## 2023-01-17 DIAGNOSIS — K2100 Gastro-esophageal reflux disease with esophagitis, without bleeding: Secondary | ICD-10-CM | POA: Diagnosis not present

## 2023-01-17 MED ORDER — OMEPRAZOLE 20 MG PO CPDR
DELAYED_RELEASE_CAPSULE | ORAL | 0 refills | Status: DC
Start: 2023-01-17 — End: 2023-01-18

## 2023-01-17 NOTE — Assessment & Plan Note (Signed)
Acute, ongoing. Sent patient home with stool culture kit to return. Educated to try full liquid diet for worsened symptoms and then advance to [employer] as tolerated. Awaiting stool culture, will treat based on results.

## 2023-01-17 NOTE — Patient Instructions (Addendum)
Drink plenty of fluids to stay hydrated: water, Gatorade Try full liquid diet: Beef/chicken broth, ice chips, popsicles (sugar free) Advance to BRAT diet: Bananas, rice, applesauce, toast

## 2023-01-17 NOTE — Telephone Encounter (Signed)
Reason for Disposition  [1] SEVERE diarrhea (e.g., 7 or more times / day more than normal) AND [2] present > 24 hours (1 day)  Answer Assessment - Initial Assessment Questions 1. DIARRHEA SEVERITY: "How bad is the diarrhea?" "How many more stools have you had in the past 24 hours than normal?"    - NO DIARRHEA (SCALE 0)   - MILD (SCALE 1-3): Few loose or mushy BMs; increase of 1-3 stools over normal daily number of stools; mild increase in ostomy output.   -  MODERATE (SCALE 4-7): Increase of 4-6 stools daily over normal; moderate increase in ostomy output.   -  SEVERE (SCALE 8-10; OR "WORST POSSIBLE"): Increase of 7 or more stools daily over normal; moderate increase in ostomy output; incontinence.     Having diarrhea since Wed.   I feel weak.   "It's running out of me".    My thyroid medicine was increased didn't know if it was from that.    First of April was when I started the increase in my synthroid.    2. ONSET: "When did the diarrhea begin?"      Wed. It started.      I took one Imodium.  It stopped for 2 days and then Sat it started again.    3. BM CONSISTENCY: "How loose or watery is the diarrhea?"      It's real watery and green colored. 4. VOMITING: "Are you also vomiting?" If Yes, ask: "How many times in the past 24 hours?"      No   I've felt nauseas.    I've had bad acid reflux.   Bad taste in my mouth. 5. ABDOMEN PAIN: "Are you having any abdomen pain?" If Yes, ask: "What does it feel like?" (e.g., crampy, dull, intermittent, constant)      My stomach is rumbling a lot. 6. ABDOMEN PAIN SEVERITY: If present, ask: "How bad is the pain?"  (e.g., Scale 1-10; mild, moderate, or severe)   - MILD (1-3): doesn't interfere with normal activities, abdomen soft and not tender to touch    - MODERATE (4-7): interferes with normal activities or awakens from sleep, abdomen tender to touch    - SEVERE (8-10): excruciating pain, doubled over, unable to do any normal activities       No pain 7.  ORAL INTAKE: If vomiting, "Have you been able to drink liquids?" "How much liquids have you had in the past 24 hours?"     I've been able to eat.   I probably need to drink more water.   Not drinking much.   No change in urine. 8. HYDRATION: "Any signs of dehydration?" (e.g., dry mouth [not just dry lips], too weak to stand, dizziness, new weight loss) "When did you last urinate?"     Weakness.    No dizzy spells. 9. EXPOSURE: "Have you traveled to a foreign country recently?" "Have you been exposed to anyone with diarrhea?" "Could you have eaten any food that was spoiled?"     No    My heart racing even when sitting.    Pulse was 87 this morning.   107/92 this morning.    122/70-80 is my normal.    10. ANTIBIOTIC USE: "Are you taking antibiotics now or have you taken antibiotics in the past 2 months?"       Yes.   I had a UTI and was put on Amoxicillin a couple of months ago. 11. OTHER SYMPTOMS: "Do you  have any other symptoms?" (e.g., fever, blood in stool)       No blood 12. PREGNANCY: "Is there any chance you are pregnant?" "When was your last menstrual period?"       N/A  Protocols used: [facility]

## 2023-01-17 NOTE — Progress Notes (Signed)
BP 132/79   Pulse 81   Temp 97.7 F (36.5 C) (Oral)   Ht 4' 11.02" (1.499 m)   Wt 92 lb 4.8 oz (41.9 kg)   SpO2 98%   BMI 18.63 kg/m    Subjective:    Patient ID: Brandy Chen, female    DOB: March 26, 1951, 72 y.o.   MRN: [id number]  HPI: Brandy Chen is a 72 y.o. female  Chief Complaint  Patient presents with   Diarrhea    Since last Wednesday, has been only once today. Patient also states that her stomach is just rolling.    DIARRHEA Wednesday she had 3 episodes, took 2 mg Imodium and did not have any episodes until Saturday. Saturday she had x3, Sunday x2, and today x1. She took 1 mg of imodium today. Episodes are yellow and green in color with liquid consistency. She denies stool incontnenece, it does not wake her at night. She has also had an increased amount of acid reflux. She admits to fatigue and sometimes feels hard to catch her breath. She admits to hyperactive bowel sounds. She does admit to eating out at restaurant before episodes began on Tuesday.  Duration: 5 days.  Nature:  Discomfort, no pain Location: Generalized  Severity: severe  Radiation: no Frequency: intermittent Alleviating factors:Imodium  stopped bowel movements a little Aggravating factors: None Treatments attempted: Ranitidine Otc; helped with acid reflux Constipation: no Diarrhea: yes Episodes of diarrhea/day:x2-x3/day Mucous in the stool: no Heartburn: yes Bloating:yes Flatulence: yes Nausea: yes Vomiting: no Melena or hematochezia: no Rash: no Jaundice: no Fever: no Weight loss: no   Relevant past medical, surgical, family and social history reviewed and updated as indicated. Interim medical history since our last visit reviewed. Allergies and medications reviewed and updated.  Review of Systems  Constitutional:  Positive for appetite change and fatigue. Negative for chills, diaphoresis, fever and unexpected weight change.  Respiratory: Negative.    Cardiovascular: Negative.    Gastrointestinal:  Positive for diarrhea and nausea. Negative for abdominal distention, abdominal pain, anal bleeding, blood in stool, constipation, rectal pain and vomiting.    Per HPI unless specifically indicated above     Objective:    BP 132/79   Pulse 81   Temp 97.7 F (36.5 C) (Oral)   Ht 4' 11.02" (1.499 m)   Wt 92 lb 4.8 oz (41.9 kg)   SpO2 98%   BMI 18.63 kg/m   Wt Readings from Last 3 Encounters:  01/17/23 92 lb 4.8 oz (41.9 kg)  11/18/22 94 lb 11.2 oz (43 kg)  08/11/22 92 lb 12.8 oz (42.1 kg)    Physical Exam Vitals and nursing note reviewed.  Constitutional:      General: She is awake. She is not in acute distress.    Appearance: Normal appearance. She is well-developed and well-groomed. She is not ill-appearing.  HENT:     Head: Normocephalic and atraumatic.     Right Ear: Hearing and external ear normal. No drainage.     Left Ear: Hearing and external ear normal. No drainage.     Nose: Nose normal.  Eyes:     General: Lids are normal.        Right eye: No discharge.        Left eye: No discharge.     Conjunctiva/sclera: Conjunctivae normal.  Cardiovascular:     Rate and Rhythm: Normal rate and regular rhythm.     Heart sounds: Normal heart sounds, S1 normal and S2  normal. No murmur heard.    No gallop.  Pulmonary:     Effort: Pulmonary effort is normal. No accessory muscle usage or respiratory distress.     Breath sounds: Normal breath sounds.  Abdominal:     General: Abdomen is flat. Bowel sounds are increased. There is distension.     Palpations: Abdomen is soft.     Tenderness: There is abdominal tenderness in the right lower quadrant, epigastric area, periumbilical area and left lower quadrant. There is no guarding or rebound. Negative signs include Murphy's sign, Rovsing's sign, McBurney's sign, psoas sign and obturator sign.     Hernia: No hernia is present.  Musculoskeletal:        General: Normal range of motion.     Cervical back: Full  passive range of motion without pain and normal range of motion.     Right lower leg: No edema.     Left lower leg: No edema.  Skin:    General: Skin is warm and dry.     Capillary Refill: Capillary refill takes less than 2 seconds.  Neurological:     Mental Status: She is alert and oriented to person, place, and time.  Psychiatric:        Attention and Perception: Attention normal.        Mood and Affect: Mood normal.        Speech: Speech normal.        Behavior: Behavior normal. Behavior is cooperative.        Thought Content: Thought content normal.     Results for orders placed or performed in visit on 12/22/22  TSH  Result Value Ref Range   TSH 4.770 (H) 0.450 - 4.500 uIU/mL  T4, free  Result Value Ref Range   Free T4 1.37 0.82 - 1.77 ng/dL      Assessment & Plan:   Problem List Items Addressed This Visit       Digestive   Functional diarrhea    Acute, ongoing. Sent patient home with stool culture kit to return. Educated to try full liquid diet for worsened symptoms and then advance to [employer] as tolerated. Awaiting stool culture, will treat based on results.       Relevant Orders   Cdiff NAA+O+P+Stool Culture   Fecal leukocytes   H. pylori antigen, stool   Stool Culture   Fecal occult blood, imunochemical   Other Visit Diagnoses     Gastroesophageal reflux disease with esophagitis without hemorrhage       Acute, ongoing. Omeprazole  daily given to help with acid reflux, instructed to take over two week period. F/u if symptoms worsen.   Relevant Medications   omeprazole (PRILOSEC) 20 MG capsule        Follow up plan: Return in about 31 days (around 02/17/2023), or if symptoms worsen or fail to improve, for Follow up for mood and osteoporosis.

## 2023-01-17 NOTE — Telephone Encounter (Signed)
  Chief Complaint: Diarrhea  Symptoms: "it's just pouring out of me".  Watery  She was on Amoxicillin a couple of months ago for a UTI.    Imodium does stop the diarrhea but then it returns. Frequency: Since Wed. Pertinent Negatives: Patient denies sick exposures, abd pain, blood in stool.    Disposition: ED /[] Urgent Care (no appt availability in office) / Appointment(In office/virtual)/  [facility]/ Home Care/ Refused Recommended Disposition /[] [facility]/  Follow-up with PCP Additional Notes: Appt. Made with Prescott Gum, FNP for today at 1:20.     Encouraged to drink plenty of water and fluids along with some Gator Aid or Adult Pedialyte which she was agreeable to doing.

## 2023-01-18 DIAGNOSIS — K591 Functional diarrhea: Secondary | ICD-10-CM | POA: Diagnosis not present

## 2023-01-18 MED ORDER — OMEPRAZOLE 20 MG PO CPDR
20.0000 mg | DELAYED_RELEASE_CAPSULE | Freq: Every day | ORAL | 0 refills | Status: DC
Start: 2023-01-18 — End: 2023-02-09

## 2023-01-18 NOTE — Addendum Note (Signed)
Addended by: Prescott Gum on: 01/18/2023 09:40 AM   Modules accepted: Orders

## 2023-01-20 LAB — CDIFF NAA+O+P+STOOL CULTURE: Toxigenic C. Difficile by PCR: NEGATIVE

## 2023-01-20 LAB — FECAL OCCULT BLOOD, IMMUNOCHEMICAL: Fecal Occult Bld: NEGATIVE

## 2023-01-21 LAB — FECAL LEUKOCYTES

## 2023-01-21 LAB — CDIFF NAA+O+P+STOOL CULTURE: Toxigenic C. Difficile by PCR: NEGATIVE

## 2023-01-22 LAB — FECAL LEUKOCYTES

## 2023-01-24 ENCOUNTER — Telehealth: Payer: Self-pay | Admitting: Nurse Practitioner

## 2023-01-24 LAB — H. PYLORI ANTIGEN, STOOL: H pylori Ag, Stl: NEGATIVE

## 2023-01-24 LAB — CDIFF NAA+O+P+STOOL CULTURE: E coli, Shiga toxin Assay: NEGATIVE

## 2023-01-24 NOTE — Telephone Encounter (Signed)
Pt. Given results, verbalizes understanding.States she is feeling better. 

## 2023-02-03 ENCOUNTER — Other Ambulatory Visit: Payer: Medicare HMO

## 2023-02-03 DIAGNOSIS — E063 Autoimmune thyroiditis: Secondary | ICD-10-CM

## 2023-02-04 LAB — T4, FREE: Free T4: 1.82 ng/dL — ABNORMAL HIGH (ref 0.82–1.77)

## 2023-02-04 LAB — TSH: TSH: 2.44 u[IU]/mL (ref 0.450–4.500)

## 2023-02-04 NOTE — Progress Notes (Signed)
Good morning, please let Maison know her thyroid labs returned and TSH is normal, Free T4 mild elevation.  At this time continue Levothyroxine 50 MCG daily.  No changes needed.  Any questions? Keep being amazing!!  Thank you for allowing me to participate in your care.  I appreciate you. Kindest regards, Taya Ashbaugh

## 2023-02-09 ENCOUNTER — Other Ambulatory Visit: Payer: Self-pay | Admitting: Family Medicine

## 2023-02-09 DIAGNOSIS — K2100 Gastro-esophageal reflux disease with esophagitis, without bleeding: Secondary | ICD-10-CM

## 2023-02-09 NOTE — Telephone Encounter (Signed)
Requested medication (s) are due for refill today:   No  was prescribed for 2 weeks only by Rashelle  Requested medication (s) are on the active medication list:   Yes  Future visit scheduled:   Yes 02/17/2023 for F/U   Last ordered: 01/18/2023 #30, 0 refills  Returned for provider to review for further refills.   Pharmacy is requesting a 90 day supply and a DX Code   Requested Prescriptions  Pending Prescriptions Disp Refills   omeprazole (PRILOSEC) 20 MG capsule [Pharmacy Med Name: OMEPRAZOLE DR 20 MG CAPSULE] 90 capsule 1    Sig: Take 1 capsule (20 mg total) by mouth daily. Take 1 tablet daily for 2 weeks     Gastroenterology: Proton Pump Inhibitors Passed - 02/09/2023  8:31 AM      Passed - Valid encounter within last 12 months    Recent Outpatient Visits           3 weeks ago Functional diarrhea   [facility] [place], Sherran Needs, NP   2 months ago Primary hypertension   [place] [facility] [place], [place] T, NP   6 months ago Urinary symptom or sign   [place] [facility], Corrie Dandy T, NP   6 months ago Chronic anxiety   [place] [facility] [place], Dorie Rank, NP   9 months ago Hashimoto's thyroiditis   [place] [facility] [place], Dorie Rank, NP       Future Appointments             In 1 week [place], Dorie Rank, NP [facility]

## 2023-02-13 NOTE — Patient Instructions (Signed)
Managing Anxiety, Adult After being diagnosed with anxiety, you may be relieved to know why you have felt or behaved a certain way. You may also feel overwhelmed about the treatment ahead and what it will mean for your life. With care and support, you can manage your anxiety. How to manage lifestyle changes Understanding the difference between stress and anxiety Although stress can play a role in anxiety, it is not the same as anxiety. Stress is your body's reaction to life changes and events, both good and bad. Stress is often caused by something external, such as a deadline, test, or competition. It normally goes away after the event has ended and will last just a few hours. But, stress can be ongoing and can lead to more than just stress. Anxiety is caused by something internal, such as imagining a terrible outcome or worrying that something will go wrong that will greatly upset you. Anxiety often does not go away even after the event is over, and it can become a long-term (chronic) worry. Lowering stress and anxiety Talk with your health care provider or a counselor to learn more about lowering anxiety and stress. They may suggest tension-reduction techniques, such as: Music. Spend time creating or listening to music that you enjoy and that inspires you. Mindfulness-based meditation. Practice being aware of your normal breaths while not trying to control your breathing. It can be done while sitting or walking. Centering prayer. Focus on a word, phrase, or sacred image that means something to you and brings you peace. Deep breathing. Expand your stomach and inhale slowly through your nose. Hold your breath for 3-5 seconds. Then breathe out slowly, letting your stomach muscles relax. Self-talk. Learn to notice and spot thought patterns that lead to anxiety reactions. Change those patterns to thoughts that feel peaceful. Muscle relaxation. Take time to tense muscles and then relax them. Choose a  tension-reduction technique that fits your lifestyle and personality. These techniques take time and practice. Set aside 5-15 minutes a day to do them. Specialized therapists can offer counseling and training in these techniques. The training to help with anxiety may be covered by some insurance plans. Other things you can do to manage stress and anxiety include: Keeping a stress diary. This can help you learn what triggers your reaction and then learn ways to manage your response. Thinking about how you react to certain situations. You may not be able to control everything, but you can control your response. Making time for activities that help you relax and not feeling guilty about spending your time in this way. Doing visual imagery. This involves imagining or creating mental pictures to help you relax. Practicing yoga. Through yoga poses, you can lower tension and relax.  Medicines Medicines for anxiety include: Antidepressant medicines. These are usually prescribed for long-term daily control. Anti-anxiety medicines. These may be added in severe cases, especially when panic attacks occur. When used together, medicines, psychotherapy, and tension-reduction techniques may be the most effective treatment. Relationships Relationships can play a big part in helping you recover. Spend more time connecting with trusted friends and family members. Think about going to couples counseling if you have a partner, taking family education classes, or going to family therapy. Therapy can help you and others better understand your anxiety. How to recognize changes in your anxiety Everyone responds differently to treatment for anxiety. Recovery from anxiety happens when symptoms lessen and stop interfering with your daily life at home or work. This may mean that you   will start to: Have better concentration and focus. Worry will interfere less in your daily thinking. Sleep better. Be less irritable. Have more  energy. Have improved memory. Try to recognize when your condition is getting worse. Contact your provider if your symptoms interfere with home or work and you feel like your condition is not improving. Follow these instructions at home: Activity Exercise. Adults should: Exercise for at least 150 minutes each week. The exercise should increase your heart rate and make you sweat (moderate-intensity exercise). Do strengthening exercises at least twice a week. Get the right amount and quality of sleep. Most adults need 7-9 hours of sleep each night. Lifestyle  Eat a healthy diet that includes plenty of vegetables, fruits, whole grains, low-fat dairy products, and lean protein. Do not eat a lot of foods that are high in fats, added sugars, or salt (sodium). Make choices that simplify your life. Do not use any products that contain nicotine or tobacco. These products include cigarettes, chewing tobacco, and vaping devices, such as e-cigarettes. If you need help quitting, ask your provider. Avoid caffeine, alcohol, and certain over-the-counter cold medicines. These may make you feel worse. Ask your pharmacist which medicines to avoid. General instructions Take over-the-counter and prescription medicines only as told by your provider. Keep all follow-up visits. This is to make sure you are managing your anxiety well or if you need more support. Where to find support You can get help and support from: Self-help groups. Online and community organizations. A trusted spiritual leader. Couples counseling. Family education classes. Family therapy. Where to find more information You may find that joining a support group helps you deal with your anxiety. The following sources can help you find counselors or support groups near you: Mental Health America: mentalhealthamerica.net Anxiety and Depression Association of America (ADAA): adaa.org National Alliance on Mental Illness (NAMI): nami.org Contact  a health care provider if: You have a hard time staying focused or finishing tasks. You spend many hours a day feeling worried about everyday life. You are very tired because you cannot stop worrying. You start to have headaches or often feel tense. You have chronic nausea or diarrhea. Get help right away if: Your heart feels like it is racing. You have shortness of breath. You have thoughts of hurting yourself or others. Get help right away if you feel like you may hurt yourself or others, or have thoughts about taking your own life. Go to your nearest emergency room or: Call 911. Call the National Suicide Prevention Lifeline at 1-800-273-8255 or 988. This is open 24 hours a day. Text the Crisis Text Line at 741741. This information is not intended to replace advice given to you by your health care provider. Make sure you discuss any questions you have with your health care provider. Document Revised: 06/22/2022 Document Reviewed: 01/04/2021 Elsevier Patient Education  2023 Elsevier Inc.  

## 2023-02-15 ENCOUNTER — Other Ambulatory Visit: Payer: Self-pay | Admitting: Nurse Practitioner

## 2023-02-16 NOTE — Telephone Encounter (Signed)
Requested medication (s) are due for refill today: Yes  Requested medication (s) are on the active medication list: Yes  Last refill:  01/26/22  Future visit scheduled: Yes  Notes to clinic:  See request.    Requested Prescriptions  Pending Prescriptions Disp Refills   meclizine (ANTIVERT) 25 MG tablet [Pharmacy Med Name: MECLIZINE 25 MG TABLET] 30 tablet 3    Sig: TAKE 1 TABLET BY MOUTH 3 TIMES DAILY AS NEEDED FOR DIZZINESS.     Not Delegated - Gastroenterology: Antiemetics Failed - 02/15/2023  7:25 PM      Failed - This refill cannot be delegated      Passed - Valid encounter within last 6 months    Recent Outpatient Visits           1 month ago Functional diarrhea   [place] [facility] [place], Sherran Needs, NP   3 months ago Primary hypertension   [place] [facility] [place], [place] T, NP   6 months ago Urinary symptom or sign   [place] [facility] [place], Corrie Dandy T, NP   6 months ago Chronic anxiety   [place] [facility] [place], Norris T, NP   9 months ago Hashimoto's thyroiditis    [facility] [place], Dorie Rank, NP       Future Appointments             Tomorrow Marjie Skiff, NP  [facility], [facility]

## 2023-02-17 ENCOUNTER — Encounter: Payer: Self-pay | Admitting: Nurse Practitioner

## 2023-02-17 ENCOUNTER — Ambulatory Visit (INDEPENDENT_AMBULATORY_CARE_PROVIDER_SITE_OTHER): Payer: Medicare HMO | Admitting: Nurse Practitioner

## 2023-02-17 VITALS — BP 122/72 | HR 70 | Temp 98.0°F | Ht 59.02 in | Wt 93.2 lb

## 2023-02-17 DIAGNOSIS — Z79899 Other long term (current) drug therapy: Secondary | ICD-10-CM

## 2023-02-17 DIAGNOSIS — F419 Anxiety disorder, unspecified: Secondary | ICD-10-CM

## 2023-02-17 DIAGNOSIS — E063 Autoimmune thyroiditis: Secondary | ICD-10-CM | POA: Diagnosis not present

## 2023-02-17 NOTE — Progress Notes (Signed)
BP 122/72   Pulse 70   Temp 98 F (36.7 C) (Oral)   Ht 4' 11.02" (1.499 m)   Wt 93 lb 3.2 oz (42.3 kg)   SpO2 98%   BMI 18.81 kg/m    Subjective:    Patient ID: Brandy Chen, female    DOB: 1951/03/18, 72 y.o.   MRN: [id number]  HPI: Brandy Chen is a 72 y.o. female  Chief Complaint  Patient presents with   Mood   Hypothyroidism   HYPOTHYROIDISM Continues on Levothyroxine 50 MCG.   Thyroid control status:stable Satisfied with current treatment? yes Medication side effects: no Medication compliance: good compliance Etiology of hypothyroidism:  Recent dose adjustment:no Fatigue: no Cold intolerance: no Heat intolerance: no Weight gain: no Weight loss: no Constipation: no Diarrhea/loose stools: no Palpitations: no Lower extremity edema: no Anxiety/depressed mood: no  ANXIETY/STRESS Has been on Xanax at bedtime for long period, years.  Was taking 1/2 tablet Xanax during day and 1/2 at night -- she is currently taking 1/2 tablet only at night and trying to cut back.  Tried Sertraline, this was not successful + multiple other medications. Tried Buspar, but this caused vertigo. Pt is aware of risks of benzo medication use to include increased sedation, respiratory suppression, falls, dependence and cardiovascular events.  Pt would like to continue treatment as benefit determine to outweigh risk. This works for her per her report and helps her mood.  Reviewed BEERS criteria with her. PDMP last fill 01/26/23.    Duration:stable Anxious mood: yes Excessive worrying: yes Irritability: no  Sweating: no Nausea: no Palpitations:no Hyperventilation: no Panic attacks: no Agoraphobia: no  Obscessions/compulsions: no Depressed mood: no    Feb 24, 2023   11:07 AM 01/17/2023    1:34 PM 11/18/2022   10:48 AM 07/29/2022   10:45 AM 04/28/2022   10:52 AM  Depression screen PHQ 2/9  Decreased Interest 0  0 0 0  Down, Depressed, Hopeless 0  0 0 0  PHQ - 2 Score 0  0 0 0  Altered  sleeping 0  0 0 0  Tired, decreased energy 1 1 1  0 0  Change in appetite 1  0 0 0  Feeling bad or failure about yourself  0  0 0 0  Trouble concentrating 0  0 0 0  Moving slowly or fidgety/restless 0  0 0 0  Suicidal thoughts 0  0 0 0  PHQ-9 Score 2  1 0 0  Difficult doing work/chores  Not difficult at all Not difficult at all Not difficult at all Not difficult at all  Anhedonia: no Weight changes: no Insomnia: none Hypersomnia: no Fatigue/loss of energy: no Feelings of worthlessness: no Feelings of guilt: no Impaired concentration/indecisiveness: no Suicidal ideations: no  Crying spells: no Recent Stressors/Life Changes: no   Relationship problems: no   Family stress: no     Financial stress: no    Job stress: no    Recent death/loss: no     24-Feb-2023   11:07 AM 01/17/2023    1:34 PM 11/18/2022   10:49 AM 07/29/2022   10:45 AM  GAD 7 : Generalized Anxiety Score  Nervous, Anxious, on Edge 0 1 0 0  Control/stop worrying 0  0 0  Worry too much - different things 0  0 0  Trouble relaxing 0  0 0  Restless 0  0 0  Easily annoyed or irritable 0  0 0  Afraid - awful might happen 0  0  0  Total GAD 7 Score 0  0 0  Anxiety Difficulty Not difficult at all  Not difficult at all Not difficult at all    Relevant past medical, surgical, family and social history reviewed and updated as indicated. Interim medical history since our last visit reviewed. Allergies and medications reviewed and updated.  Review of Systems  Constitutional:  Negative for activity change, appetite change, diaphoresis, fatigue and fever.  Respiratory:  Negative for cough, chest tightness and shortness of breath.   Cardiovascular:  Negative for chest pain, palpitations and leg swelling.  Gastrointestinal: Negative.   Neurological: Negative.   Psychiatric/Behavioral: Negative.     Per HPI unless specifically indicated above     Objective:    BP 122/72   Pulse 70   Temp 98 F (36.7 C) (Oral)   Ht 4'  11.02" (1.499 m)   Wt 93 lb 3.2 oz (42.3 kg)   SpO2 98%   BMI 18.81 kg/m   Wt Readings from Last 3 Encounters:  02/17/23 93 lb 3.2 oz (42.3 kg)  01/17/23 92 lb 4.8 oz (41.9 kg)  11/18/22 94 lb 11.2 oz (43 kg)    Physical Exam Vitals and nursing note reviewed.  Constitutional:      General: She is awake. She is not in acute distress.    Appearance: She is well-developed. She is not ill-appearing.  HENT:     Head: Normocephalic.     Right Ear: Hearing, ear canal and external ear normal.     Left Ear: Hearing, ear canal and external ear normal.  Eyes:     General: Lids are normal.        Right eye: No discharge.        Left eye: No discharge.     Conjunctiva/sclera: Conjunctivae normal.     Pupils: Pupils are equal, round, and reactive to light.  Neck:     Thyroid: No thyromegaly.     Vascular: No carotid bruit.  Cardiovascular:     Rate and Rhythm: Normal rate and regular rhythm.     Heart sounds: Normal heart sounds. No murmur heard.    No gallop.  Pulmonary:     Effort: Pulmonary effort is normal. No accessory muscle usage or respiratory distress.     Breath sounds: Normal breath sounds.  Abdominal:     General: Bowel sounds are normal.     Palpations: Abdomen is soft.  Musculoskeletal:     Cervical back: Normal range of motion and neck supple.     Right lower leg: No edema.     Left lower leg: No edema.  Skin:    General: Skin is warm and dry.  Neurological:     Mental Status: She is alert and oriented to person, place, and time.  Psychiatric:        Attention and Perception: Attention normal.        Mood and Affect: Mood normal.        Behavior: Behavior normal. Behavior is cooperative.        Thought Content: Thought content normal.        Judgment: Judgment normal.    Results for orders placed or performed in visit on 02/03/23  TSH  Result Value Ref Range   TSH 2.440 0.450 - 4.500 uIU/mL  T4, free  Result Value Ref Range   Free T4 1.82 (H) 0.82 - 1.77  ng/dL      Assessment & Plan:   Problem List Items Addressed  This Visit       Endocrine   Hashimoto's thyroiditis    Ongoing, stable.  Continue current medication regimen and adjust as needed.  Labs up to date.        Other   Chronic anxiety - Primary    Chronic, ongoing for years and stable with Xanax.  Denies SI/HI.  Has been on Xanax for several years, started by previous PCP.  Refills sent in today #30 with 2 refills.  Return in 3 months.  Tried Buspar in past, but reports this made her dizzy.  UDS due 11/19/23.  Has contract on file.      Long-term current use of benzodiazepine    Refer to chronic anxiety plan of care.        Follow up plan: Return in about 3 months (around 05/20/2023) for MOOD, OSTEOPOROSIS, HLD, THYROID.

## 2023-02-17 NOTE — Assessment & Plan Note (Addendum)
Ongoing, stable.  Continue current medication regimen and adjust as needed.  Labs up to date.

## 2023-02-17 NOTE — Assessment & Plan Note (Signed)
Chronic, ongoing for years and stable with Xanax.  Denies SI/HI.  Has been on Xanax for several years, started by previous PCP.  Refills sent in today #30 with 2 refills.  Return in 3 months.  Tried Buspar in past, but reports this made her dizzy.  UDS due 11/19/23.  Has contract on file.

## 2023-02-17 NOTE — Assessment & Plan Note (Signed)
Refer to chronic anxiety plan of care. °

## 2023-02-25 ENCOUNTER — Telehealth: Payer: Self-pay | Admitting: Nurse Practitioner

## 2023-02-25 ENCOUNTER — Other Ambulatory Visit: Payer: Self-pay | Admitting: Nurse Practitioner

## 2023-02-25 NOTE — Telephone Encounter (Signed)
Medication Refill - Medication: ALPRAZolam (XANAX) 0.5 MG tablet [[id number]]    ( not due until morning June 1st )   Has the patient contacted their pharmacy? Yes.      (Preferred Pharmacy (with phone number or street name):     [id number] - [place], [place] - [address]  [address] [place] [address]  Phone: [phone] Fax: [phone]  Hours: Not open 24 hours    Has the patient been seen for an appointment in the last year OR does the patient have an upcoming appointment? Yes.    Agent: Please be advised that RX refills may take up to 3 business days. We ask that you follow-up with your pharmacy.

## 2023-02-25 NOTE — Telephone Encounter (Signed)
Requested medications are due for refill today.  yes  Requested medications are on the active medications list.  yes  Last refill. 11/24/2022 #30 2 rf  Future visit scheduled.   yes  Notes to clinic.  Refill not delegated.    Requested Prescriptions  Pending Prescriptions Disp Refills   ALPRAZolam (XANAX) 0.5 MG tablet 30 tablet 2    Sig: TAKE 1 TABLET BY MOUTH EVERYDAY AT BEDTIME     Not Delegated - Psychiatry: Anxiolytics/Hypnotics 2 Failed - 02/25/2023  4:49 PM      Failed - This refill cannot be delegated      Passed - Urine Drug Screen completed in last 360 days      Passed - Patient is not pregnant      Passed - Valid encounter within last 6 months    Recent Outpatient Visits           1 week ago Chronic anxiety   [place] [facility] Doran, [place] T, NP   1 month ago Functional diarrhea   [place] [facility] Pearley, Sherran Needs, NP   3 months ago Primary hypertension   [place] [facility] [place], [place] T, NP   6 months ago Urinary symptom or sign   [place] [facility] Seltzer, [place] T, NP   7 months ago Chronic anxiety   [place] [facility] Kaleva, Dorie Rank, NP       Future Appointments             In 2 months Cannady, Dorie Rank, NP [place] [facility]

## 2023-02-25 NOTE — Telephone Encounter (Signed)
Copied from CRM [id number]. Topic: Medicare AWV >> Feb 25, 2023  2:04 PM Payton Doughty wrote: Reason for CRM: LM 02/25/2023 to schedule AWV   Verlee Rossetti; Care Guide Ambulatory Clinical Support [facility] Direct Dial: [phone]

## 2023-03-01 MED ORDER — ALPRAZOLAM 0.5 MG PO TABS: ORAL_TABLET | ORAL | 2 refills | Status: DC

## 2023-04-04 ENCOUNTER — Ambulatory Visit (INDEPENDENT_AMBULATORY_CARE_PROVIDER_SITE_OTHER): Payer: Medicare HMO

## 2023-04-04 VITALS — BP 124/64 | Ht 59.0 in | Wt 93.0 lb

## 2023-04-04 DIAGNOSIS — Z1231 Encounter for screening mammogram for malignant neoplasm of breast: Secondary | ICD-10-CM

## 2023-04-04 DIAGNOSIS — Z0000 Encounter for general adult medical examination without abnormal findings: Secondary | ICD-10-CM

## 2023-04-04 NOTE — Patient Instructions (Signed)
Brandy Chen , Thank you for taking time to come for your Medicare Wellness Visit. I appreciate your ongoing commitment to your health goals. Please review the following plan we discussed and let me know if I can assist you in the future.   These are the goals we discussed:  Goals      DIET - EAT MORE FRUITS AND VEGETABLES     DIET - INCREASE WATER INTAKE     Recommend drinking at least 6-8 glasses of water a day      Increase water intake     Recommend drinking 3-4 glasses of water a day.     Patient Stated     03/09/2021, wants to lower cholesterol     Patient Stated     Continue with lowering cholesterol        This is a list of the screening recommended for you and due dates:  Health Maintenance  Topic Date Due   Zoster (Shingles) Vaccine (1 of 2) Never done   Pneumonia Vaccine (2 of 2 - PPSV23 or PCV20) 11/19/2023*   DEXA scan (bone density measurement)  11/19/2023*   Flu Shot  04/28/2023   Mammogram  02/11/2024   Medicare Annual Wellness Visit  04/03/2024   Cologuard (Stool DNA test)  12/13/2024   Hepatitis C Screening  Completed   HPV Vaccine  Aged Out   DTaP/Tdap/Td vaccine  Discontinued   Stool Blood Test  Discontinued   COVID-19 Vaccine  Discontinued  *Topic was postponed. The date shown is not the original due date.    Advanced directives: no  Conditions/risks identified: none  Next appointment: Follow up in one year for your annual wellness visit 04/09/24 @ 9:45 am by phone   Preventive Care 65 Years and Older, Female Preventive care refers to lifestyle choices and visits with your health care provider that can promote health and wellness. What does preventive care include? A yearly physical exam. This is also called an annual well check. Dental exams once or twice a year. Routine eye exams. Ask your health care provider how often you should have your eyes checked. Personal lifestyle choices, including: Daily care of your teeth and gums. Regular physical  activity. Eating a healthy diet. Avoiding tobacco and drug use. Limiting alcohol use. Practicing safe sex. Taking low-dose aspirin every day. Taking vitamin and mineral supplements as recommended by your health care provider. What happens during an annual well check? The services and screenings done by your health care provider during your annual well check will depend on your age, overall health, lifestyle risk factors, and family history of disease. Counseling  Your health care provider may ask you questions about your: Alcohol use. Tobacco use. Drug use. Emotional well-being. Home and relationship well-being. Sexual activity. Eating habits. History of falls. Memory and ability to understand (cognition). Work and work Astronomer. Reproductive health. Screening  You may have the following tests or measurements: Height, weight, and BMI. Blood pressure. Lipid and cholesterol levels. These may be checked every 5 years, or more frequently if you are over 19 years old. Skin check. Lung cancer screening. You may have this screening every year starting at age 21 if you have a 30-pack-year history of smoking and currently smoke or have quit within the past 15 years. Fecal occult blood test (FOBT) of the stool. You may have this test every year starting at age 14. Flexible sigmoidoscopy or colonoscopy. You may have a sigmoidoscopy every 5 years or a colonoscopy every 10  years starting at age 59. Hepatitis C blood test. Hepatitis B blood test. Sexually transmitted disease (STD) testing. Diabetes screening. This is done by checking your blood sugar (glucose) after you have not eaten for a while (fasting). You may have this done every 1-3 years. Bone density scan. This is done to screen for osteoporosis. You may have this done starting at age 8. Mammogram. This may be done every 1-2 years. Talk to your health care provider about how often you should have regular mammograms. Talk with your  health care provider about your test results, treatment options, and if necessary, the need for more tests. Vaccines  Your health care provider may recommend certain vaccines, such as: Influenza vaccine. This is recommended every year. Tetanus, diphtheria, and acellular pertussis (Tdap, Td) vaccine. You may need a Td booster every 10 years. Zoster vaccine. You may need this after age 67. Pneumococcal 13-valent conjugate (PCV13) vaccine. One dose is recommended after age 60. Pneumococcal polysaccharide (PPSV23) vaccine. One dose is recommended after age 57. Talk to your health care provider about which screenings and vaccines you need and how often you need them. This information is not intended to replace advice given to you by your health care provider. Make sure you discuss any questions you have with your health care provider. Document Released: 10/10/2015 Document Revised: 06/02/2016 Document Reviewed: 07/15/2015 Elsevier Interactive Patient Education  2017 [employer].  Fall Prevention in the Home Falls can cause injuries. They can happen to people of all ages. There are many things you can do to make your home safe and to help prevent falls. What can I do on the outside of my home? Regularly fix the edges of walkways and driveways and fix any cracks. Remove anything that might make you trip as you walk through a door, such as a raised step or threshold. Trim any bushes or trees on the path to your home. Use bright outdoor lighting. Clear any walking paths of anything that might make someone trip, such as rocks or tools. Regularly check to see if handrails are loose or broken. Make sure that both sides of any steps have handrails. Any raised decks and porches should have guardrails on the edges. Have any leaves, snow, or ice cleared regularly. Use sand or salt on walking paths during winter. Clean up any spills in your garage right away. This includes oil or grease spills. What can I  do in the bathroom? Use night lights. Install grab bars by the toilet and in the tub and shower. Do not use towel bars as grab bars. Use non-skid mats or decals in the tub or shower. If you need to sit down in the shower, use a plastic, non-slip stool. Keep the floor dry. Clean up any water that spills on the floor as soon as it happens. Remove soap buildup in the tub or shower regularly. Attach bath mats securely with double-sided non-slip rug tape. Do not have throw rugs and other things on the floor that can make you trip. What can I do in the bedroom? Use night lights. Make sure that you have a light by your bed that is easy to reach. Do not use any sheets or blankets that are too big for your bed. They should not hang down onto the floor. Have a firm chair that has side arms. You can use this for support while you get dressed. Do not have throw rugs and other things on the floor that can make you trip.  What can I do in the kitchen? Clean up any spills right away. Avoid walking on wet floors. Keep items that you use a lot in easy-to-reach places. If you need to reach something above you, use a strong step stool that has a grab bar. Keep electrical cords out of the way. Do not use floor polish or wax that makes floors slippery. If you must use wax, use non-skid floor wax. Do not have throw rugs and other things on the floor that can make you trip. What can I do with my stairs? Do not leave any items on the stairs. Make sure that there are handrails on both sides of the stairs and use them. Fix handrails that are broken or loose. Make sure that handrails are as long as the stairways. Check any carpeting to make sure that it is firmly attached to the stairs. Fix any carpet that is loose or worn. Avoid having throw rugs at the top or bottom of the stairs. If you do have throw rugs, attach them to the floor with carpet tape. Make sure that you have a light switch at the top of the stairs  and the bottom of the stairs. If you do not have them, ask someone to add them for you. What else can I do to help prevent falls? Wear shoes that: Do not have high heels. Have rubber bottoms. Are comfortable and fit you well. Are closed at the toe. Do not wear sandals. If you use a stepladder: Make sure that it is fully opened. Do not climb a closed stepladder. Make sure that both sides of the stepladder are locked into place. Ask someone to hold it for you, if possible. Clearly mark and make sure that you can see: Any grab bars or handrails. First and last steps. Where the edge of each step is. Use tools that help you move around (mobility aids) if they are needed. These include: Canes. Walkers. Scooters. Crutches. Turn on the lights when you go into a dark area. Replace any light bulbs as soon as they burn out. Set up your furniture so you have a clear path. Avoid moving your furniture around. If any of your floors are uneven, fix them. If there are any pets around you, be aware of where they are. Review your medicines with your doctor. Some medicines can make you feel dizzy. This can increase your chance of falling. Ask your doctor what other things that you can do to help prevent falls. This information is not intended to replace advice given to you by your health care provider. Make sure you discuss any questions you have with your health care provider. Document Released: 07/10/2009 Document Revised: 02/19/2016 Document Reviewed: 10/18/2014 Elsevier Interactive Patient Education  2017 [employer].

## 2023-04-04 NOTE — Progress Notes (Signed)
Subjective:   Brandy Chen is a 72 y.o. female who presents for Medicare Annual (Subsequent) preventive examination.  Visit Complete: Virtual  I connected with  DAILIN MAGANA on 04/04/23 by a audio enabled telemedicine application and verified that I am speaking with the correct person using two identifiers.  Patient Location: Home  Provider Location: Office/Clinic  I discussed the limitations of evaluation and management by telemedicine. The patient expressed understanding and agreed to proceed.   Review of Systems     Cardiac Risk Factors include: advanced age (>51men, >56 women);hypertension     Objective:    Today's Vitals   04/04/23 0959 04/04/23 1015  BP:  124/64  Weight:  93 lb (42.2 kg)  Height:  4\' 11"  (1.499 m)  PainSc: 0-No pain    Body mass index is 18.78 kg/m.     04/04/2023   10:04 AM 03/16/2022   10:37 AM 03/09/2021   10:32 AM 03/05/2020   10:30 AM 03/26/2019   10:36 AM 03/20/2018   11:20 AM 02/17/2017    1:18 PM  Advanced Directives  Does Patient Have a Medical Advance Directive? No No No No No No No  Does patient want to make changes to medical advance directive?    Yes (MAU/Ambulatory/Procedural Areas - Information given)     Would patient like information on creating a medical advance directive? No - Patient declined No - Patient declined    Yes (MAU/Ambulatory/Procedural Areas - Information given) Yes (MAU/Ambulatory/Procedural Areas - Information given)    Current Medications (verified) Outpatient Encounter Medications as of 04/04/2023  Medication Sig   ALPRAZolam (XANAX) 0.5 MG tablet TAKE 1 TABLET BY MOUTH EVERYDAY AT BEDTIME   aspirin EC 81 MG tablet Take 81 mg by mouth.   Calcium Carb-Cholecalciferol 600-800 MG-UNIT TABS Take 1 tablet by mouth 2 (two) times daily.   Cetirizine HCl 10 MG CAPS Take 1 capsule by mouth daily.   chlorhexidine (PERIDEX) 0.12 % solution 15 mLs 2 (two) times daily.   Krill Oil 300 MG CAPS Take 300 mg by mouth daily.    levothyroxine (SYNTHROID) 50 MCG tablet Take 1 tablet (50 mcg total) by mouth daily.   Magnesium 500 MG TABS Take 500 mg by mouth daily.   meclizine (ANTIVERT) 25 MG tablet TAKE 1 TABLET BY MOUTH 3 TIMES DAILY AS NEEDED FOR DIZZINESS.   vitamin C (ASCORBIC ACID) 500 MG tablet Take 500 mg by mouth daily.   gentamicin ointment (GARAMYCIN) 0.1 % 3 (three) times daily. (Patient not taking: Reported on 04/04/2023)   omeprazole (PRILOSEC) 20 MG capsule TAKE 1 CAPSULE (20 MG TOTAL) BY MOUTH DAILY. TAKE 1 TABLET DAILY FOR 2 WEEKS (Patient not taking: Reported on 04/04/2023)   No facility-administered encounter medications on file as of 04/04/2023.    Allergies (verified) Sulfa antibiotics   History: Past Medical History:  Diagnosis Date   Anxiety    Depression    Endometriosis    Fatigue    Hyperlipidemia    Hypertension    Insomnia    Lumbago    Osteoporosis    Thyroid disease    Vitamin D deficiency    Past Surgical History:  Procedure Laterality Date   CESAREAN SECTION     pyloric stenosis     TUBAL LIGATION     Family History  Problem Relation Age of Onset   Stroke Mother    Hypertension Mother    Stroke Maternal Grandfather    Hypertension Sister  Social History   Socioeconomic History   Marital status: Married    Spouse name: Not on file   Number of children: Not on file   Years of education: Not on file   Highest education level: High school graduate  Occupational History   Not on file  Tobacco Use   Smoking status: Never   Smokeless tobacco: Never  Vaping Use   Vaping Use: Never used  Substance and Sexual Activity   Alcohol use: No    Alcohol/week: 0.0 standard drinks of alcohol   Drug use: Yes    Types: Benzodiazepines   Sexual activity: Not Currently  Other Topics Concern   Not on file  Social History Narrative   Not on file   Social Determinants of Health   Financial Resource Strain: Low Risk  (04/04/2023)   Overall Financial Resource Strain (CARDIA)     Difficulty of Paying Living Expenses: Not hard at all  Food Insecurity: No Food Insecurity (04/04/2023)   Hunger Vital Sign    Worried About Running Out of Food in the Last Year: Never true    Ran Out of Food in the Last Year: Never true  Transportation Needs: No Transportation Needs (04/04/2023)   PRAPARE - Administrator, Civil Service (Medical): No    Lack of Transportation (Non-Medical): No  Physical Activity: Insufficiently Active (04/04/2023)   Exercise Vital Sign    Days of Exercise per Week: 3 days    Minutes of Exercise per Session: 30 min  Stress: No Stress Concern Present (04/04/2023)   [employer] of Occupational Health - Occupational Stress Questionnaire    Feeling of Stress : Not at all  Social Connections: Moderately Isolated (04/04/2023)   Social Connection and Isolation Panel [NHANES]    Frequency of Communication with Friends and Family: More than three times a week    Frequency of Social Gatherings with Friends and Family: More than three times a week    Attends Religious Services: Never    Database administrator or Organizations: No    Attends Engineer, structural: Never    Marital Status: Married    Tobacco Counseling Counseling given: Not Answered   Clinical Intake:  Pre-visit preparation completed: Yes  Pain : No/denies pain Pain Score: 0-No pain     Nutritional Risks: None Diabetes: No  How often do you need to have someone help you when you read instructions, pamphlets, or other written materials from your doctor or pharmacy?: 1 - Never  Interpreter Needed?: No  Information entered by :: Kennedy Bucker, LPN   Activities of Daily Living    04/04/2023   10:04 AM  In your present state of health, do you have any difficulty performing the following activities:  Hearing? 0  Vision? 0  Difficulty concentrating or making decisions? 0  Walking or climbing stairs? 0  Dressing or bathing? 0  Doing errands, shopping? 0   Preparing Food and eating ? N  Using the Toilet? N  In the past six months, have you accidently leaked urine? N  Do you have problems with loss of bowel control? N  Managing your Medications? N  Managing your Finances? N  Housekeeping or managing your Housekeeping? N    Patient Care Team: Marjie Skiff, NP as PCP - General (Nurse Practitioner)  Indicate any recent Medical Services you may have received from other than [facility] providers in the past year (date may be approximate).     Assessment:  This is a routine wellness examination for [place].  Hearing/Vision screen Hearing Screening - Comments:: No aids Vision Screening - Comments:: Readers- Dr.Woodard  Dietary issues and exercise activities discussed:     Goals Addressed             This Visit's Progress    DIET - EAT MORE FRUITS AND VEGETABLES         Depression Screen    04/04/2023   10:02 AM 02/17/2023   11:07 AM 11/18/2022   10:48 AM 07/29/2022   10:45 AM 04/28/2022   10:52 AM 03/16/2022   10:40 AM 01/26/2022   10:43 AM  PHQ 2/9 Scores  PHQ - 2 Score 0 0 0 0 0 2 0  PHQ- 9 Score 0 2 1 0 0 7 1    Fall Risk    04/04/2023   10:04 AM 02/17/2023   11:06 AM 01/17/2023    1:34 PM 11/18/2022   10:48 AM 07/29/2022   10:45 AM  Fall Risk   Falls in the past year? 0 0 0 0 0  Number falls in past yr: 0 0 0 0 0  Injury with Fall? 0 0 0 0 0  Risk for fall due to : No Fall Risks No Fall Risks No Fall Risks No Fall Risks Impaired balance/gait  Follow up Falls prevention discussed;Falls evaluation completed Falls evaluation completed Falls evaluation completed Falls evaluation completed Falls evaluation completed    MEDICARE RISK AT HOME:  Medicare Risk at Home - 04/04/23 1005     Any stairs in or around the home? Yes    If so, are there any without handrails? No    Home free of loose throw rugs in walkways, pet beds, electrical cords, etc? Yes    Adequate lighting in your home to reduce risk of falls? Yes    Life  alert? No    Use of a cane, walker or w/c? No    Grab bars in the bathroom? No    Shower chair or bench in shower? Yes    Elevated toilet seat or a handicapped toilet? No             TIMED UP AND GO:  Was the test performed?  No    Cognitive Function:        04/04/2023   10:05 AM 03/16/2022   10:34 AM 03/09/2021   10:35 AM 03/26/2019   10:40 AM 03/20/2018   11:21 AM  6CIT Screen  What Year? 0 points 0 points 0 points 0 points 0 points  What month? 0 points 0 points 0 points 0 points 0 points  What time? 0 points 0 points 0 points 0 points 0 points  Count back from 20 0 points 0 points 0 points 0 points 0 points  Months in reverse 0 points 0 points 0 points 0 points 0 points  Repeat phrase 0 points 0 points 0 points 0 points 0 points  Total Score 0 points 0 points 0 points 0 points 0 points    Immunizations Immunization History  Administered Date(s) Administered   Fluad Quad(high Dose 65+) 06/25/2019, 07/09/2020, 07/15/2021, 07/29/2022   Influenza, High Dose Seasonal PF 08/23/2016, 08/04/2017, 07/17/2018   Influenza-Unspecified 06/27/2015   Moderna Sars-Covid-2 Vaccination 11/02/2019, 12/04/2019   Pneumococcal Conjugate-13 02/16/2016    TDAP status: Due, Education has been provided regarding the importance of this vaccine. Advised may receive this vaccine at local pharmacy or Health Dept. Aware to provide a copy of the  vaccination record if obtained from local pharmacy or Health Dept. Verbalized acceptance and understanding.  Flu Vaccine status: Up to date  Pneumococcal vaccine status: Due, Education has been provided regarding the importance of this vaccine. Advised may receive this vaccine at local pharmacy or Health Dept. Aware to provide a copy of the vaccination record if obtained from local pharmacy or Health Dept. Verbalized acceptance and understanding.  Covid-19 vaccine status: Completed vaccines  Qualifies for Shingles Vaccine? Yes   Zostavax completed No    Shingrix Completed?: No.    Education has been provided regarding the importance of this vaccine. Patient has been advised to call insurance company to determine out of pocket expense if they have not yet received this vaccine. Advised may also receive vaccine at local pharmacy or Health Dept. Verbalized acceptance and understanding.  Screening Tests Health Maintenance  Topic Date Due   Zoster Vaccines- Shingrix (1 of 2) Never done   Pneumonia Vaccine 31+ Years old (2 of 2 - PPSV23 or PCV20) 11/19/2023 (Originally 02/15/2017)   DEXA SCAN  11/19/2023 (Originally 02/17/2017)   INFLUENZA VACCINE  04/28/2023   MAMMOGRAM  02/11/2024   Medicare Annual Wellness (AWV)  04/03/2024   Fecal DNA (Cologuard)  12/13/2024   Hepatitis C Screening  Completed   HPV VACCINES  Aged Out   DTaP/Tdap/Td  Discontinued   COLON CANCER SCREENING ANNUAL FOBT  Discontinued   COVID-19 Vaccine  Discontinued    Health Maintenance  Health Maintenance Due  Topic Date Due   Zoster Vaccines- Shingrix (1 of 2) Never done    Colorectal cancer screening: Type of screening: Cologuard. Completed 12/13/21. Repeat every 3 years  Mammogram status: Completed 02/10/22. Repeat every year  Declined referral BDS  Lung Cancer Screening: (Low Dose CT Chest recommended if Age 57-80 years, 20 pack-year currently smoking OR have quit w/in 15years.) does not qualify.   Additional Screening:  Hepatitis C Screening: does qualify; Completed 04/03/18  Vision Screening: Recommended annual ophthalmology exams for early detection of glaucoma and other disorders of the eye. Is the patient up to date with their annual eye exam?  Yes  Who is the provider or what is the name of the office in which the patient attends annual eye exams? Dr.Woodard If pt is not established with a provider, would they like to be referred to a provider to establish care? No .   Dental Screening: Recommended annual dental exams for proper oral  hygiene   Community Resource Referral / Chronic Care Management: CRR required this visit?  No   CCM required this visit?  No     Plan:     I have personally reviewed and noted the following in the patient's chart:   Medical and social history Use of alcohol, tobacco or illicit drugs  Current medications and supplements including opioid prescriptions. Patient is not currently taking opioid prescriptions. Functional ability and status Nutritional status Physical activity Advanced directives List of other physicians Hospitalizations, surgeries, and ER visits in previous 12 months Vitals Screenings to include cognitive, depression, and falls Referrals and appointments  In addition, I have reviewed and discussed with patient certain preventive protocols, quality metrics, and best practice recommendations. A written personalized care plan for preventive services as well as general preventive health recommendations were provided to patient.     Hal Hope, LPN   10/02/1094   After Visit Summary: (MyChart) Due to this being a telephonic visit, the after visit summary with patients personalized plan was offered to  patient via MyChart   Nurse Notes: none

## 2023-05-22 NOTE — Patient Instructions (Signed)

## 2023-05-25 ENCOUNTER — Ambulatory Visit (INDEPENDENT_AMBULATORY_CARE_PROVIDER_SITE_OTHER): Payer: Medicare HMO | Admitting: Nurse Practitioner

## 2023-05-25 ENCOUNTER — Encounter: Payer: Self-pay | Admitting: Nurse Practitioner

## 2023-05-25 VITALS — BP 118/67 | HR 66 | Ht 59.0 in | Wt 92.4 lb

## 2023-05-25 DIAGNOSIS — E063 Autoimmune thyroiditis: Secondary | ICD-10-CM | POA: Diagnosis not present

## 2023-05-25 DIAGNOSIS — F419 Anxiety disorder, unspecified: Secondary | ICD-10-CM

## 2023-05-25 MED ORDER — ALPRAZOLAM 0.5 MG PO TABS: ORAL_TABLET | ORAL | 2 refills | Status: DC

## 2023-05-25 NOTE — Assessment & Plan Note (Signed)
Ongoing, stable.  Continue current medication regimen and adjust as needed.  Labs up to date.  Recheck next visit.

## 2023-05-25 NOTE — Assessment & Plan Note (Signed)
Chronic, ongoing for years and stable with Xanax.  Denies SI/HI.  Has been on Xanax for several years, started by previous PCP.  Future refills sent in today #30 with 2 refills.  Return in 6 months, patient and PCP came to agreeance that since she has been stable for many years on this and uses as ordered we will allow for 6 month vs 3 month visits.  Tried Buspar in past, but reports this made her dizzy.  UDS due 11/19/23.  Has contract on file.

## 2023-05-25 NOTE — Progress Notes (Signed)
BP 118/67   Pulse 66   Ht 4\' 11"  (1.499 m)   Wt 92 lb 6.4 oz (41.9 kg)   SpO2 97%   BMI 18.66 kg/m    Subjective:    Patient ID: Brandy Chen, female    DOB: 11/21/1950, 72 y.o.   MRN: [id number]  HPI: Brandy Chen is a 72 y.o. female  Chief Complaint  Patient presents with   Mood    Hypothyroidism   Flu Vaccine    Patient says she will hold off on Flu vaccine at today's visit as she thinks it is a little early.    HYPOTHYROIDISM Continues on Levothyroxine 50 MCG, occasionally takes a 25 MCG.   Thyroid control status:stable Satisfied with current treatment? yes Medication side effects: no Medication compliance: good compliance Etiology of hypothyroidism: Hashimoto's Recent dose adjustment:no Fatigue: at baseline Cold intolerance: no Heat intolerance: no Weight gain: no Weight loss: no Constipation: no Diarrhea/loose stools: no Palpitations: no Lower extremity edema: no Anxiety/depressed mood: no  ANXIETY/STRESS Has been on Xanax at bedtime for long period, years, started by previous PCP.  Was taking 1/2 tablet Xanax during day and 1/2 at night -- she is currently taking 1/2 tablet only at night on occasion, trying to cut back.  Tried Sertraline in past, this was not successful + multiple other medications. Tried Buspar, but this caused vertigo.   Pt is aware of risks of benzo medication use to include increased sedation, respiratory suppression, falls, dependence and cardiovascular events.  Pt would like to continue treatment as benefit determine to outweigh risk. This works for her per her report and helps her mood.  Review BEERS criteria with her at every visit. PDMP last fill 04/30/23.    Duration:stable Anxious mood: occasional Excessive worrying: no Irritability: no  Sweating: no Nausea: no Palpitations:no Hyperventilation: no Panic attacks: no Agoraphobia: no  Obscessions/compulsions: no Depressed mood: no    05-27-2023   10:51 AM 04/04/2023   10:02 AM  02/17/2023   11:07 AM 01/17/2023    1:34 PM 11/18/2022   10:48 AM  Depression screen PHQ 2/9  Decreased Interest 0 0 0  0  Down, Depressed, Hopeless 0 0 0  0  PHQ - 2 Score 0 0 0  0  Altered sleeping 0 0 0  0  Tired, decreased energy 1 0 1 1 1   Change in appetite 0 0 1  0  Feeling bad or failure about yourself  0 0 0  0  Trouble concentrating 0 0 0  0  Moving slowly or fidgety/restless 0 0 0  0  Suicidal thoughts 0 0 0  0  PHQ-9 Score 1 0 2  1  Difficult doing work/chores Not difficult at all   Not difficult at all Not difficult at all  Anhedonia: no Weight changes: no Insomnia: occasional Hypersomnia: no Fatigue/loss of energy: no Feelings of worthlessness: no Feelings of guilt: no Impaired concentration/indecisiveness: no Suicidal ideations: no  Crying spells: no Recent Stressors/Life Changes: no   Relationship problems: no   Family stress: no     Financial stress: no    Job stress: no    Recent death/loss: no     2023/05/27   10:51 AM 02/17/2023   11:07 AM 01/17/2023    1:34 PM 11/18/2022   10:49 AM  GAD 7 : Generalized Anxiety Score  Nervous, Anxious, on Edge 0 0 1 0  Control/stop worrying 0 0  0  Worry too much - different  things 0 0  0  Trouble relaxing 0 0  0  Restless 0 0  0  Easily annoyed or irritable 0 0  0  Afraid - awful might happen 0 0  0  Total GAD 7 Score 0 0  0  Anxiety Difficulty  Not difficult at all  Not difficult at all   Relevant past medical, surgical, family and social history reviewed and updated as indicated. Interim medical history since our last visit reviewed. Allergies and medications reviewed and updated.  Review of Systems  Constitutional:  Negative for activity change, appetite change, diaphoresis, fatigue and fever.  Respiratory:  Negative for cough, chest tightness and shortness of breath.   Cardiovascular:  Negative for chest pain, palpitations and leg swelling.  Gastrointestinal: Negative.   Neurological: Negative.    Psychiatric/Behavioral: Negative.     Per HPI unless specifically indicated above     Objective:    BP 118/67   Pulse 66   Ht 4\' 11"  (1.499 m)   Wt 92 lb 6.4 oz (41.9 kg)   SpO2 97%   BMI 18.66 kg/m   Wt Readings from Last 3 Encounters:  05/25/23 92 lb 6.4 oz (41.9 kg)  04/04/23 93 lb (42.2 kg)  02/17/23 93 lb 3.2 oz (42.3 kg)    Physical Exam Vitals and nursing note reviewed.  Constitutional:      General: She is awake. She is not in acute distress.    Appearance: She is well-developed. She is not ill-appearing.  HENT:     Head: Normocephalic.     Right Ear: Hearing, ear canal and external ear normal.     Left Ear: Hearing, ear canal and external ear normal.  Eyes:     General: Lids are normal.        Right eye: No discharge.        Left eye: No discharge.     Conjunctiva/sclera: Conjunctivae normal.     Pupils: Pupils are equal, round, and reactive to light.  Neck:     Thyroid: No thyromegaly.     Vascular: No carotid bruit.  Cardiovascular:     Rate and Rhythm: Normal rate and regular rhythm.     Heart sounds: Normal heart sounds. No murmur heard.    No gallop.  Pulmonary:     Effort: Pulmonary effort is normal. No accessory muscle usage or respiratory distress.     Breath sounds: Normal breath sounds.  Abdominal:     General: Bowel sounds are normal.     Palpations: Abdomen is soft.  Musculoskeletal:     Cervical back: Normal range of motion and neck supple.     Right lower leg: No edema.     Left lower leg: No edema.  Skin:    General: Skin is warm and dry.  Neurological:     Mental Status: She is alert and oriented to person, place, and time.  Psychiatric:        Attention and Perception: Attention normal.        Mood and Affect: Mood normal.        Behavior: Behavior normal. Behavior is cooperative.        Thought Content: Thought content normal.        Judgment: Judgment normal.    Results for orders placed or performed in visit on 02/03/23   TSH  Result Value Ref Range   TSH 2.440 0.450 - 4.500 uIU/mL  T4, free  Result Value Ref Range  Free T4 1.82 (H) 0.82 - 1.77 ng/dL      Assessment & Plan:   Problem List Items Addressed This Visit       Endocrine   Hashimoto's thyroiditis - Primary    Ongoing, stable.  Continue current medication regimen and adjust as needed.  Labs up to date.  Recheck next visit.        Other   Chronic anxiety    Chronic, ongoing for years and stable with Xanax.  Denies SI/HI.  Has been on Xanax for several years, started by previous PCP.  Future refills sent in today #30 with 2 refills.  Return in 6 months, patient and PCP came to agreeance that since she has been stable for many years on this and uses as ordered we will allow for 6 month vs 3 month visits.  Tried Buspar in past, but reports this made her dizzy.  UDS due 11/19/23.  Has contract on file.      Relevant Medications   ALPRAZolam (XANAX) 0.5 MG tablet (Start on 05/31/2023)      Follow up plan: Return in about 6 months (around 11/25/2023) for MOOD, THYROID, HLD, HTN, OSTEOPOROSIS.

## 2023-06-18 ENCOUNTER — Other Ambulatory Visit: Payer: Self-pay | Admitting: Nurse Practitioner

## 2023-06-20 NOTE — Telephone Encounter (Signed)
Requested Prescriptions  Pending Prescriptions Disp Refills   levothyroxine (SYNTHROID) 50 MCG tablet [Pharmacy Med Name: LEVOTHYROXINE 50 MCG TABLET] 90 tablet 2    Sig: TAKE 1 TABLET BY MOUTH EVERY DAY     Endocrinology:  Hypothyroid Agents Passed - 06/18/2023  1:02 AM      Passed - TSH in normal range and within 360 days    TSH  Date Value Ref Range Status  02/03/2023 2.440 0.450 - 4.500 uIU/mL Final         Passed - Valid encounter within last 12 months    Recent Outpatient Visits           3 weeks ago Hashimoto's thyroiditis   [place] [facility] Alanreed, Horine T, NP   4 months ago Chronic anxiety   [place] [facility] Mildred, Gwynn T, NP   5 months ago Functional diarrhea   [place] [facility] Pearley, Sherran Needs, NP   7 months ago Primary hypertension   [place] [facility] [place], [place] T, NP   10 months ago Urinary symptom or sign   [place] [facility] [place], Dorie Rank, NP       Future Appointments             In 5 months Cannady, Dorie Rank, NP [facility], [facility]

## 2023-07-20 ENCOUNTER — Ambulatory Visit (INDEPENDENT_AMBULATORY_CARE_PROVIDER_SITE_OTHER): Payer: Medicare HMO

## 2023-07-20 DIAGNOSIS — Z23 Encounter for immunization: Secondary | ICD-10-CM

## 2023-07-28 DIAGNOSIS — Z1231 Encounter for screening mammogram for malignant neoplasm of breast: Secondary | ICD-10-CM | POA: Diagnosis not present

## 2023-08-16 DIAGNOSIS — H1045 Other chronic allergic conjunctivitis: Secondary | ICD-10-CM | POA: Diagnosis not present

## 2023-08-16 DIAGNOSIS — H43812 Vitreous degeneration, left eye: Secondary | ICD-10-CM | POA: Diagnosis not present

## 2023-08-16 DIAGNOSIS — H2513 Age-related nuclear cataract, bilateral: Secondary | ICD-10-CM | POA: Diagnosis not present

## 2023-08-28 ENCOUNTER — Other Ambulatory Visit: Payer: Self-pay | Admitting: Nurse Practitioner

## 2023-08-29 NOTE — Telephone Encounter (Signed)
Requested medication (s) are due for refill today - yes  Requested medication (s) are on the active medication list -yes  Future visit scheduled -yes  Last refill: 05/31/23 #30 2RF  Notes to clinic: non delegated Rx  Requested Prescriptions  Pending Prescriptions Disp Refills   ALPRAZolam (XANAX) 0.5 MG tablet [Pharmacy Med Name: ALPRAZOLAM 0.5 MG TABLET] 30 tablet 2    Sig: TAKE 1 TABLET BY MOUTH EVERYDAY AT BEDTIME     Not Delegated - Psychiatry: Anxiolytics/Hypnotics 2 Failed - 08/28/2023 10:46 AM      Failed - This refill cannot be delegated      Passed - Urine Drug Screen completed in last 360 days      Passed - Patient is not pregnant      Passed - Valid encounter within last 6 months    Recent Outpatient Visits           3 months ago Hashimoto's thyroiditis   [place] [facility] Wisner, Molena T, NP   6 months ago Chronic anxiety   [place] [facility] [place], [place] T, NP   7 months ago Functional diarrhea   [place] [facility] Pearley, Sherran Needs, NP   9 months ago Primary hypertension   [place] [facility] [place], [place] T, NP   1 year ago Urinary symptom or sign   [place] [facility] [place], Dorie Rank, NP       Future Appointments             In 3 months Cannady, Dorie Rank, NP [place] [facility]               Requested Prescriptions  Pending Prescriptions Disp Refills   ALPRAZolam (XANAX) 0.5 MG tablet [Pharmacy Med Name: ALPRAZOLAM 0.5 MG TABLET] 30 tablet 2    Sig: TAKE 1 TABLET BY MOUTH EVERYDAY AT BEDTIME     Not Delegated - Psychiatry: Anxiolytics/Hypnotics 2 Failed - 08/28/2023 10:46 AM      Failed - This refill cannot be delegated      Passed - Urine Drug Screen completed in last 360 days      Passed - Patient is not pregnant      Passed - Valid encounter within last 6 months    Recent Outpatient Visits           3 months  ago Hashimoto's thyroiditis   [place] [facility] Elmer, Jackson T, NP   6 months ago Chronic anxiety   [place] [facility] [place], [place] T, NP   7 months ago Functional diarrhea   [place] [facility] Pearley, Sherran Needs, NP   9 months ago Primary hypertension   [place] [facility] [place], [place] T, NP   1 year ago Urinary symptom or sign   [place] [facility] [place], Dorie Rank, NP       Future Appointments             In 3 months Cannady, Dorie Rank, NP [place] [facility], [facility]

## 2023-11-26 ENCOUNTER — Other Ambulatory Visit: Payer: Self-pay | Admitting: Nurse Practitioner

## 2023-11-28 NOTE — Telephone Encounter (Signed)
 Requested medications are due for refill today.  yes  Requested medications are on the active medications list.  yes  Last refill. 08/29/2023 #30 2 rf  Future visit scheduled.   yes  Notes to clinic.  Refill not delegated.    Requested Prescriptions  Pending Prescriptions Disp Refills   ALPRAZolam (XANAX) 0.5 MG tablet [Pharmacy Med Name: ALPRAZOLAM 0.5 MG TABLET] 30 tablet     Sig: TAKE 1 TABLET BY MOUTH EVERYDAY AT BEDTIME     Not Delegated - Psychiatry: Anxiolytics/Hypnotics 2 Failed - 11/28/2023  3:25 PM      Failed - This refill cannot be delegated      Failed - Urine Drug Screen completed in last 360 days      Failed - Valid encounter within last 6 months    Recent Outpatient Visits           6 months ago Hashimoto's thyroiditis   [place] [facility] [place], Dorie Rank, NP   9 months ago Chronic anxiety   [place] [facility] [place], [place] T, NP   10 months ago Functional diarrhea   [place] [facility] [place], Sherran Needs, NP   1 year ago Primary hypertension   [place] [facility] [place], [place] T, NP   1 year ago Urinary symptom or sign   [place] [facility] [place], Dorie Rank, NP       Future Appointments             In 2 days Marjie Skiff, NP [place] [facility]            Passed - Patient is not pregnant

## 2023-11-30 ENCOUNTER — Ambulatory Visit: Payer: Self-pay | Admitting: Nurse Practitioner

## 2023-11-30 DIAGNOSIS — F419 Anxiety disorder, unspecified: Secondary | ICD-10-CM

## 2023-11-30 DIAGNOSIS — E063 Autoimmune thyroiditis: Secondary | ICD-10-CM

## 2023-11-30 DIAGNOSIS — E559 Vitamin D deficiency, unspecified: Secondary | ICD-10-CM

## 2023-11-30 DIAGNOSIS — I10 Essential (primary) hypertension: Secondary | ICD-10-CM

## 2023-11-30 DIAGNOSIS — E7800 Pure hypercholesterolemia, unspecified: Secondary | ICD-10-CM

## 2023-11-30 DIAGNOSIS — M810 Age-related osteoporosis without current pathological fracture: Secondary | ICD-10-CM

## 2023-11-30 DIAGNOSIS — Z79899 Other long term (current) drug therapy: Secondary | ICD-10-CM

## 2023-12-04 NOTE — Patient Instructions (Signed)
 Managing Anxiety, Adult  After being diagnosed with anxiety, you may be relieved to know why you have felt or behaved a certain way. You may also feel overwhelmed about the treatment ahead and what it will mean for your life. With care and support, you can manage your anxiety.  How to manage lifestyle changes  Understanding the difference between stress and anxiety  Although stress can play a role in anxiety, it is not the same as anxiety. Stress is your body's reaction to life changes and events, both good and bad. Stress is often caused by something external, such as a deadline, test, or competition. It normally goes away after the event has ended and will last just a few hours. But, stress can be ongoing and can lead to more than just stress.  Anxiety is caused by something internal, such as imagining a terrible outcome or worrying that something will go wrong that will greatly upset you. Anxiety often does not go away even after the event is over, and it can become a long-term (chronic) worry.  Lowering stress and anxiety    Talk with your health care provider or a counselor to learn more about lowering anxiety and stress. They may suggest tension-reduction techniques, such as:  Music. Spend time creating or listening to music that you enjoy and that inspires you.  Mindfulness-based meditation. Practice being aware of your normal breaths while not trying to control your breathing. It can be done while sitting or walking.  Centering prayer. Focus on a word, phrase, or sacred image that means something to you and brings you peace.  Deep breathing. Expand your stomach and inhale slowly through your nose. Hold your breath for 3-5 seconds. Then breathe out slowly, letting your stomach muscles relax.  Self-talk. Learn to notice and spot thought patterns that lead to anxiety reactions. Change those patterns to thoughts that feel peaceful.  Muscle relaxation. Take time to tense muscles and then relax them.  Choose a  tension-reduction technique that fits your lifestyle and personality. These techniques take time and practice. Set aside 5-15 minutes a day to do them. Specialized therapists can offer counseling and training in these techniques. The training to help with anxiety may be covered by some insurance plans.  Other things you can do to manage stress and anxiety include:  Keeping a stress diary. This can help you learn what triggers your reaction and then learn ways to manage your response.  Thinking about how you react to certain situations. You may not be able to control everything, but you can control your response.  Making time for activities that help you relax and not feeling guilty about spending your time in this way.  Doing visual imagery. This involves imagining or creating mental pictures to help you relax.  Practicing yoga. Through yoga poses, you can lower tension and relax.     Medicines  Medicines for anxiety include:  Antidepressant medicines. These are usually prescribed for long-term daily control.  Anti-anxiety medicines. These may be added in severe cases, especially when panic attacks occur.  When used together, medicines, psychotherapy, and tension-reduction techniques may be the most effective treatment.  Relationships  Relationships can play a big part in helping you recover. Spend more time connecting with trusted friends and family members. Think about going to couples counseling if you have a partner, taking family education classes, or going to family therapy. Therapy can help you and others better understand your anxiety.  How to recognize changes in  your anxiety  Everyone responds differently to treatment for anxiety. Recovery from anxiety happens when symptoms lessen and stop interfering with your daily life at home or work. This may mean that you will start to:  Have better concentration and focus. Worry will interfere less in your daily thinking.  Sleep better.  Be less irritable.  Have  more energy.  Have improved memory.  Try to recognize when your condition is getting worse. Contact your provider if your symptoms interfere with home or work and you feel like your condition is not improving.  Follow these instructions at home:  Activity  Exercise. Adults should:  Exercise for at least 150 minutes each week. The exercise should increase your heart rate and make you sweat (moderate-intensity exercise).  Do strengthening exercises at least twice a week.  Get the right amount and quality of sleep. Most adults need 7-9 hours of sleep each night.  Lifestyle    Eat a healthy diet that includes plenty of vegetables, fruits, whole grains, low-fat dairy products, and lean protein.  Do not eat a lot of foods that are high in fats, added sugars, or salt (sodium).  Make choices that simplify your life.  Do not use any products that contain nicotine or tobacco. These products include cigarettes, chewing tobacco, and vaping devices, such as e-cigarettes. If you need help quitting, ask your provider.  Avoid caffeine, alcohol, and certain over-the-counter cold medicines. These may make you feel worse. Ask your pharmacist which medicines to avoid.  General instructions  Take over-the-counter and prescription medicines only as told by your provider.  Keep all follow-up visits. This is to make sure you are managing your anxiety well or if you need more support.  Where to find support  You can get help and support from:  Self-help groups.  Online and [employer].  A trusted spiritual leader.  Couples counseling.  Family education classes.  Family therapy.  Where to find more information  You may find that joining a support group helps you deal with your anxiety. The following sources can help you find counselors or support groups near you:  Mental Health America: mentalhealthamerica.net  Anxiety and Depression Association of [place] (ADAA): adaa.org  The [employer] on Mental Illness (NAMI):  nami.org  Contact a health care provider if:  You have a hard time staying focused or finishing tasks.  You spend many hours a day feeling worried about everyday life.  You are very tired because you cannot stop worrying.  You start to have headaches or often feel tense.  You have chronic nausea or diarrhea.  Get help right away if:  Your heart feels like it is racing.  You have shortness of breath.  You have thoughts of hurting yourself or others.  Get help right away if you feel like you may hurt yourself or others, or have thoughts about taking your own life. Go to your nearest emergency room or:  Call 911.  Call the National Suicide Prevention Lifeline at 2043122231 or 988. This is open 24 hours a day.  Text the Crisis Text Line at (832)170-7952.  This information is not intended to replace advice given to you by your health care provider. Make sure you discuss any questions you have with your health care provider.  Document Revised: 06/22/2022 Document Reviewed: 01/04/2021  Elsevier Patient Education  2024 [employer].

## 2023-12-07 ENCOUNTER — Encounter: Payer: Self-pay | Admitting: Nurse Practitioner

## 2023-12-07 ENCOUNTER — Ambulatory Visit (INDEPENDENT_AMBULATORY_CARE_PROVIDER_SITE_OTHER): Admitting: Nurse Practitioner

## 2023-12-07 VITALS — BP 116/66 | HR 80 | Temp 97.9°F | Ht 59.0 in | Wt 90.8 lb

## 2023-12-07 DIAGNOSIS — E7800 Pure hypercholesterolemia, unspecified: Secondary | ICD-10-CM

## 2023-12-07 DIAGNOSIS — E063 Autoimmune thyroiditis: Secondary | ICD-10-CM

## 2023-12-07 DIAGNOSIS — E559 Vitamin D deficiency, unspecified: Secondary | ICD-10-CM

## 2023-12-07 DIAGNOSIS — F419 Anxiety disorder, unspecified: Secondary | ICD-10-CM | POA: Diagnosis not present

## 2023-12-07 DIAGNOSIS — Z79899 Other long term (current) drug therapy: Secondary | ICD-10-CM

## 2023-12-07 DIAGNOSIS — I10 Essential (primary) hypertension: Secondary | ICD-10-CM

## 2023-12-07 DIAGNOSIS — M810 Age-related osteoporosis without current pathological fracture: Secondary | ICD-10-CM

## 2023-12-07 NOTE — Progress Notes (Signed)
 BP 116/66   Pulse 80   Temp 97.9 F (36.6 C) (Oral)   Ht 4\' 11"  (1.499 m)   Wt 90 lb 12.8 oz (41.2 kg)   SpO2 98%   BMI 18.34 kg/m    Subjective:    Patient ID: Brandy Chen, female    DOB: 1951-03-04, 73 y.o.   MRN: [id number]  HPI: Brandy Chen is a 73 y.o. female  Chief Complaint  Patient presents with   Depression   Hyperlipidemia   Hypertension   Hypothyroidism   Osteoporosis   HYPERTENSION  No current medications.  No current statin, she continues fish oil.  Hypertension status: controlled  BP monitoring frequency:  a few times a month BP range: 110-120/70 range Aspirin: no Recurrent headaches: no Visual changes: no Palpitations: no Dyspnea: no Chest pain: no Lower extremity edema: no Dizzy/lightheaded: no  The 10-year ASCVD risk score (Arnett DK, et al., 2019) is: 12.2%   Values used to calculate the score:     Age: 73 years     Sex: Female     Is Non-Hispanic African American: No     Diabetic: No     Tobacco smoker: No     Systolic Blood Pressure: 116 mmHg     Is BP treated: No     HDL Cholesterol: 78 mg/dL     Total Cholesterol: 222 mg/dL   HYPOTHYROIDISM Taking Levothyroxine 50 MCG.  Does not miss doses.  Thyroid control status:stable Satisfied with current treatment? yes Medication side effects: no Medication compliance: good compliance Etiology of hypothyroidism:  Recent dose adjustment:no Fatigue: reports having no energy, been a good month Cold intolerance: no Heat intolerance: no Weight gain: no Weight loss: no Constipation: no Diarrhea/loose stools: no Palpitations: no Lower extremity edema: no Anxiety/depressed mood: no  OSTEOPOROSIS Last DEXA in 2016, we have had multiple discussions.  She refuses to start medication and does not want repeat imaging.  Is taking supplements and stays active. Satisfied with current treatment?: yes Past osteoporosis medications/treatments: none Adequate calcium & vitamin D: yes Intolerance  to bisphosphonates:no Weight bearing exercises: yes   ANXIETY/STRESS Has been on Xanax at bedtime for long period, years.  Taking 1/2 tablet Xanax during day and 1/2 at night.  Tried Sertraline, not successful + multiple other medications without benefit. Tried Buspar, caused vertigo.  Pt is aware of risks of benzo medication use to include increased sedation, respiratory suppression, falls, dependence and cardiovascular events.  Pt would like to continue treatment as benefit determine to outweigh risk. This works for her per her report and helps her mood.  Reviewed BEERS criteria with her. PDMP last fill 11/28/23. Duration:stable Anxious mood: yes Excessive worrying: yes Irritability: no  Sweating: no Nausea: no Palpitations:no Hyperventilation: no Panic attacks: no Agoraphobia: no  Obscessions/compulsions: no Depressed mood: no    12/07/2023    2:49 PM 05/25/2023   10:51 AM 04/04/2023   10:02 AM 02/17/2023   11:07 AM 01/17/2023    1:34 PM  Depression screen PHQ 2/9  Decreased Interest 0 0 0 0   Down, Depressed, Hopeless 0 0 0 0   PHQ - 2 Score 0 0 0 0   Altered sleeping 0 0 0 0   Tired, decreased energy 1 1 0 1 1  Change in appetite 0 0 0 1   Feeling bad or failure about yourself  0 0 0 0   Trouble concentrating 0 0 0 0   Moving slowly or fidgety/restless 0  0 0 0   Suicidal thoughts 0 0 0 0   PHQ-9 Score 1 1 0 2   Difficult doing work/chores Not difficult at all Not difficult at all   Not difficult at all  Anhedonia: no Weight changes: no Insomnia: none Hypersomnia: no Fatigue/loss of energy: no Feelings of worthlessness: no Feelings of guilt: no Impaired concentration/indecisiveness: no Suicidal ideations: no  Crying spells: no Recent Stressors/Life Changes: no   Relationship problems: no   Family stress: no     Financial stress: no    Job stress: no    Recent death/loss: no     12/22/2023    2:49 PM 05/25/2023   10:51 AM 02/17/2023   11:07 AM 01/17/2023    1:34 PM   GAD 7 : Generalized Anxiety Score  Nervous, Anxious, on Edge 0 0 0 1  Control/stop worrying 0 0 0   Worry too much - different things 0 0 0   Trouble relaxing 0 0 0   Restless 0 0 0   Easily annoyed or irritable 0 0 0   Afraid - awful might happen 0 0 0   Total GAD 7 Score 0 0 0   Anxiety Difficulty Not difficult at all  Not difficult at all      Relevant past medical, surgical, family and social history reviewed and updated as indicated. Interim medical history since our last visit reviewed. Allergies and medications reviewed and updated.  Review of Systems  Constitutional:  Negative for activity change, appetite change, diaphoresis, fatigue and fever.  Respiratory:  Negative for cough, chest tightness and shortness of breath.   Cardiovascular:  Negative for chest pain, palpitations and leg swelling.  Gastrointestinal: Negative.   Neurological: Negative.   Psychiatric/Behavioral: Negative.     Per HPI unless specifically indicated above     Objective:    BP 116/66   Pulse 80   Temp 97.9 F (36.6 C) (Oral)   Ht 4\' 11"  (1.499 m)   Wt 90 lb 12.8 oz (41.2 kg)   SpO2 98%   BMI 18.34 kg/m   Wt Readings from Last 3 Encounters:  December 22, 2023 90 lb 12.8 oz (41.2 kg)  05/25/23 92 lb 6.4 oz (41.9 kg)  04/04/23 93 lb (42.2 kg)    Physical Exam Vitals and nursing note reviewed.  Constitutional:      General: She is awake. She is not in acute distress.    Appearance: She is well-developed. She is not ill-appearing.  HENT:     Head: Normocephalic.     Right Ear: Hearing, ear canal and external ear normal.     Left Ear: Hearing, ear canal and external ear normal.  Eyes:     General: Lids are normal.        Right eye: No discharge.        Left eye: No discharge.     Conjunctiva/sclera: Conjunctivae normal.     Pupils: Pupils are equal, round, and reactive to light.  Neck:     Thyroid: No thyromegaly.     Vascular: No carotid bruit.  Cardiovascular:     Rate and Rhythm:  Normal rate and regular rhythm.     Heart sounds: Normal heart sounds. No murmur heard.    No gallop.  Pulmonary:     Effort: Pulmonary effort is normal. No accessory muscle usage or respiratory distress.     Breath sounds: Normal breath sounds.  Abdominal:     General: Bowel sounds are normal.  Palpations: Abdomen is soft.  Musculoskeletal:     Cervical back: Normal range of motion and neck supple.     Right lower leg: No edema.     Left lower leg: No edema.  Skin:    General: Skin is warm and dry.  Neurological:     Mental Status: She is alert and oriented to person, place, and time.  Psychiatric:        Attention and Perception: Attention normal.        Mood and Affect: Mood normal.        Behavior: Behavior normal. Behavior is cooperative.        Thought Content: Thought content normal.        Judgment: Judgment normal.    Results for orders placed or performed in visit on 02/03/23  TSH   Collection Time: 02/03/23 10:45 AM  Result Value Ref Range   TSH 2.440 0.450 - 4.500 uIU/mL  T4, free   Collection Time: 02/03/23 10:45 AM  Result Value Ref Range   Free T4 1.82 (H) 0.82 - 1.77 ng/dL      Assessment & Plan:   Problem List Items Addressed This Visit       Cardiovascular and Mediastinum   Hypertension - Primary   Chronic, stable.  BP at goal for age without medication.  Continue current diet control.  Consider Lisinopril if elevation in BP >130/80 consistently at home and office.  DASH Diet focus recommended.  LABS: CBC and CMP.      Relevant Orders   CBC with Differential/Platelet   Comprehensive metabolic panel     Endocrine   Hashimoto's thyroiditis   Ongoing, stable.  Continue current medication regimen and adjust as needed.  Thyroid labs today.      Relevant Orders   TSH   T4, free     Musculoskeletal and Integument   Osteoporosis   Does not wish medication for osteoporosis.  Continue Vit D and calcium at home. Discussed at length provider  concern for fracture with this patient, due to having dogs and frequent injuries.  She will consider going for repeat DEXA and discuss further after this.      Relevant Orders   VITAMIN D 25 Hydroxy (Vit-D Deficiency, Fractures)     Other   Chronic anxiety   Chronic, ongoing for years and stable with Xanax.  Denies SI/HI.  Has been on Xanax for several years, started by previous PCP. Up to date on refills.  Return in 6 months, patient and PCP came to agreeance that since she has been stable for many years on this and uses as ordered we will allow for 6 month vs 3 month visits.  Tried Buspar in past, but reports this made her dizzy.  UDS due 12/06/24.  Has contract on file.      Relevant Orders   [id number] 11+Oxyco+Alc+Crt-Bund   Hypercholesteremia   Chronic, ongoing.  Never took Rosuvastatin ordered in past due to concerns about side effects -- again discussed with patient today the benefit of taking this.  Obtain lipid today and continue to recommend diet focus + highly recommend statin therapy.      Relevant Orders   Comprehensive metabolic panel   Lipid Panel w/o Chol/HDL Ratio   Long-term current use of benzodiazepine   Refer to chronic anxiety plan of care.      Relevant Orders   [id number] 11+Oxyco+Alc+Crt-Bund   Vitamin D deficiency   Chronic, ongoing.  Recheck Vit d level today  and continue supplement daily.      Relevant Orders   VITAMIN D 25 Hydroxy (Vit-D Deficiency, Fractures)   Other Visit Diagnoses       High risk medication use       UD,S due to benzo use, today.   Relevant Orders   [id number] 11+Oxyco+Alc+Crt-Bund        Follow up plan: Return in about 6 months (around 06/08/2024) for HTN/HLD, Hypothyroid, Osteoporosis.

## 2023-12-07 NOTE — Assessment & Plan Note (Signed)
Chronic, ongoing.  Recheck Vit d level today and continue supplement daily.

## 2023-12-07 NOTE — Assessment & Plan Note (Signed)
 Chronic, ongoing for years and stable with Xanax.  Denies SI/HI.  Has been on Xanax for several years, started by previous PCP. Up to date on refills.  Return in 6 months, patient and PCP came to agreeance that since she has been stable for many years on this and uses as ordered we will allow for 6 month vs 3 month visits.  Tried Buspar in past, but reports this made her dizzy.  UDS due 12/06/24.  Has contract on file.

## 2023-12-07 NOTE — Assessment & Plan Note (Signed)
Does not wish medication for osteoporosis.  Continue Vit D and calcium at home. Discussed at length provider concern for fracture with this patient, due to having dogs and frequent injuries.  She will consider going for repeat DEXA and discuss further after this. ?

## 2023-12-07 NOTE — Assessment & Plan Note (Signed)
Chronic, stable.  BP at goal for age without medication.  Continue current diet control.  Consider Lisinopril if elevation in BP >130/80 consistently at home and office.  DASH Diet focus recommended.  LABS: CBC and CMP.

## 2023-12-07 NOTE — Assessment & Plan Note (Signed)
 Chronic, ongoing.  Never took Rosuvastatin ordered in past due to concerns about side effects -- again discussed with patient today the benefit of taking this.  Obtain lipid today and continue to recommend diet focus + highly recommend statin therapy.

## 2023-12-07 NOTE — Assessment & Plan Note (Signed)
 Ongoing, stable.  Continue current medication regimen and adjust as needed.  Thyroid labs today.

## 2023-12-07 NOTE — Assessment & Plan Note (Signed)
Refer to chronic anxiety plan of care. °

## 2023-12-08 LAB — COMPREHENSIVE METABOLIC PANEL
ALT: 12 IU/L (ref 0–32)
AST: 18 IU/L (ref 0–40)
Albumin: 4.5 g/dL (ref 3.8–4.8)
Alkaline Phosphatase: 82 IU/L (ref 44–121)
BUN/Creatinine Ratio: 17 (ref 12–28)
BUN: 13 mg/dL (ref 8–27)
Bilirubin Total: 0.4 mg/dL (ref 0.0–1.2)
CO2: 23 mmol/L (ref 20–29)
Calcium: 9.2 mg/dL (ref 8.7–10.3)
Chloride: 99 mmol/L (ref 96–106)
Creatinine, Ser: 0.75 mg/dL (ref 0.57–1.00)
Globulin, Total: 2.1 g/dL (ref 1.5–4.5)
Glucose: 83 mg/dL (ref 70–99)
Potassium: 4.1 mmol/L (ref 3.5–5.2)
Sodium: 140 mmol/L (ref 134–144)
Total Protein: 6.6 g/dL (ref 6.0–8.5)
eGFR: 83 mL/min/{1.73_m2} (ref >59)

## 2023-12-08 LAB — CBC WITH DIFFERENTIAL/PLATELET
Basophils Absolute: 0.1 10*3/uL (ref 0.0–0.2)
Basos: 1 %
EOS (ABSOLUTE): 0.1 10*3/uL (ref 0.0–0.4)
Eos: 1 %
Hematocrit: 40.7 % (ref 34.0–46.6)
Hemoglobin: 13.2 g/dL (ref 11.1–15.9)
Immature Grans (Abs): 0 10*3/uL (ref 0.0–0.1)
Immature Granulocytes: 0 %
Lymphocytes Absolute: 2.3 10*3/uL (ref 0.7–3.1)
Lymphs: 37 %
MCH: 30.2 pg (ref 26.6–33.0)
MCHC: 32.4 g/dL (ref 31.5–35.7)
MCV: 93 fL (ref 79–97)
Monocytes Absolute: 0.4 10*3/uL (ref 0.1–0.9)
Monocytes: 6 %
Neutrophils Absolute: 3.4 10*3/uL (ref 1.4–7.0)
Neutrophils: 55 %
Platelets: 249 10*3/uL (ref 150–450)
RBC: 4.37 x10E6/uL (ref 3.77–5.28)
RDW: 13.1 % (ref 11.7–15.4)
WBC: 6.2 10*3/uL (ref 3.4–10.8)

## 2023-12-08 LAB — LIPID PANEL W/O CHOL/HDL RATIO
Cholesterol, Total: 203 mg/dL — ABNORMAL HIGH (ref 100–199)
HDL: 80 mg/dL (ref >39)
LDL Chol Calc (NIH): 111 mg/dL — ABNORMAL HIGH (ref 0–99)
Triglycerides: 67 mg/dL (ref 0–149)
VLDL Cholesterol Cal: 12 mg/dL (ref 5–40)

## 2023-12-08 LAB — T4, FREE: Free T4: 1.63 ng/dL (ref 0.82–1.77)

## 2023-12-08 LAB — TSH: TSH: 3.17 u[IU]/mL (ref 0.450–4.500)

## 2023-12-08 LAB — VITAMIN D 25 HYDROXY (VIT D DEFICIENCY, FRACTURES): Vit D, 25-Hydroxy: 59.8 ng/mL (ref 30.0–100.0)

## 2023-12-08 NOTE — Progress Notes (Signed)
 Good morning please let Brandy Chen know her labs have returned.  Her thyroid labs remain stable and normal, continue current Levothyroxine dosing please.  Lipid panel continues to show elevations, continue to focus on diet as I know you prefer not to take medication.  Remainder of labs look great, including kidney and liver function.  Any questions? Keep being amazing!!  Thank you for allowing me to participate in your care.  I appreciate you. Kindest regards, Mekesha Solomon

## 2023-12-14 LAB — DRUG SCREEN 764883 11+OXYCO+ALC+CRT-BUND
Amphetamines, Urine: NEGATIVE ng/mL
Barbiturate: NEGATIVE ng/mL
Cannabinoid Quant, Ur: NEGATIVE ng/mL
Cocaine (Metabolite): NEGATIVE ng/mL
Creatinine: 140.5 mg/dL (ref 20.0–300.0)
Ethanol: NEGATIVE %
Meperidine: NEGATIVE ng/mL
Methadone Screen, Urine: NEGATIVE ng/mL
OPIATE SCREEN URINE: NEGATIVE ng/mL
Oxycodone/Oxymorphone, Urine: NEGATIVE ng/mL
Phencyclidine: NEGATIVE ng/mL
Propoxyphene: NEGATIVE ng/mL
Tramadol: NEGATIVE ng/mL
pH, Urine: 5.2 (ref 4.5–8.9)

## 2023-12-14 LAB — BENZODIAZEPINES CONFIRM, URINE
Alprazolam Conf.: 287 ng/mL
Alprazolam: POSITIVE — AB
Benzodiazepines: POSITIVE ng/mL — AB
Clonazepam: NEGATIVE
Flurazepam: NEGATIVE
Lorazepam: NEGATIVE
Midazolam: NEGATIVE
Nordiazepam: NEGATIVE
Oxazepam: NEGATIVE
Temazepam: NEGATIVE
Triazolam: NEGATIVE

## 2024-01-06 IMAGING — US US THYROID
1 series · 14 of 25 positions shown · non-contrast
Comparison: None.

CLINICAL DATA: Hashimoto's thyroiditis

EXAM:
THYROID ULTRASOUND
TECHNIQUE: Ultrasound examination of the thyroid gland and adjacent soft
tissues was performed.

[Series 1: us thyroid · 0.07mm/px · 63 acquisitions, 14 frames shown]
[im 1/63]
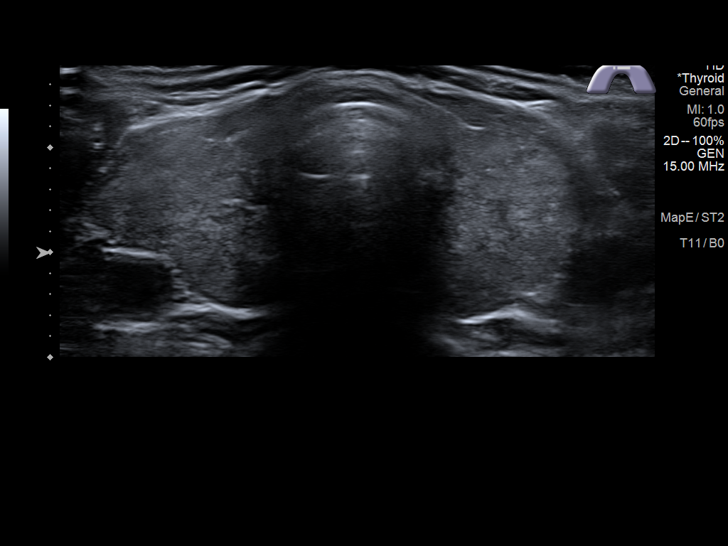
[im 6/63]
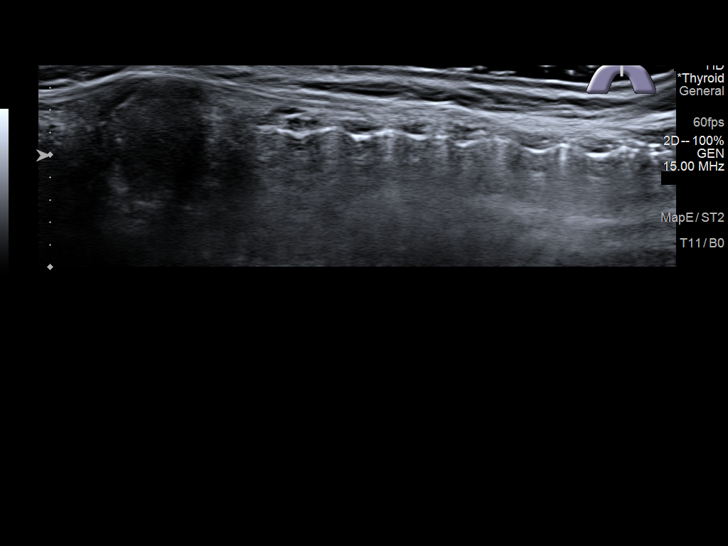
[im 11/63]
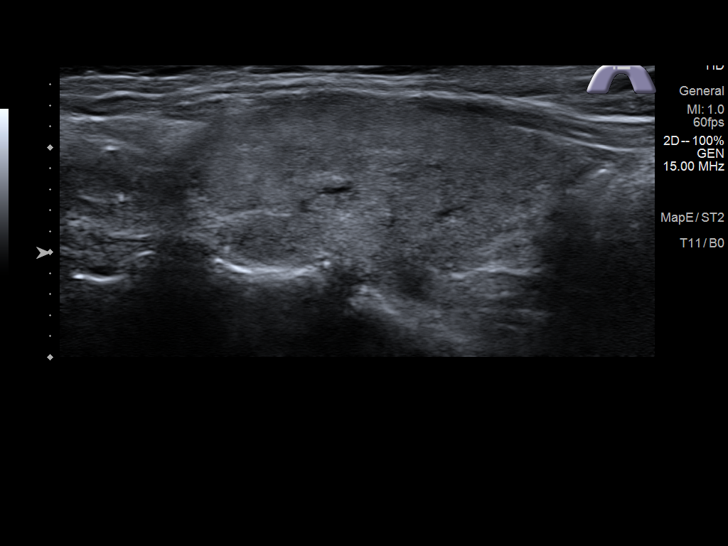
[im 16/63]
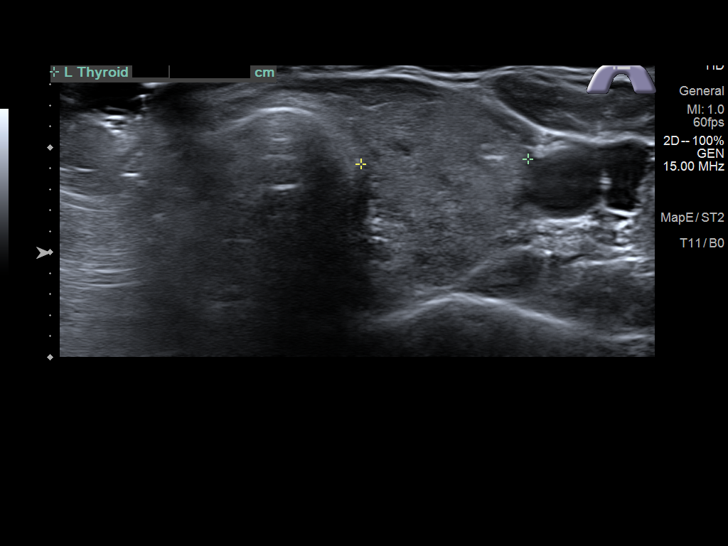
[im 21/63]
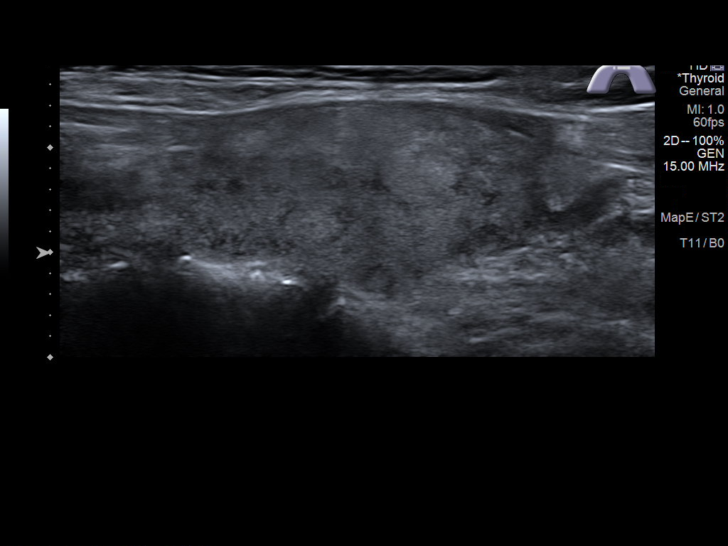
[im 24/63]
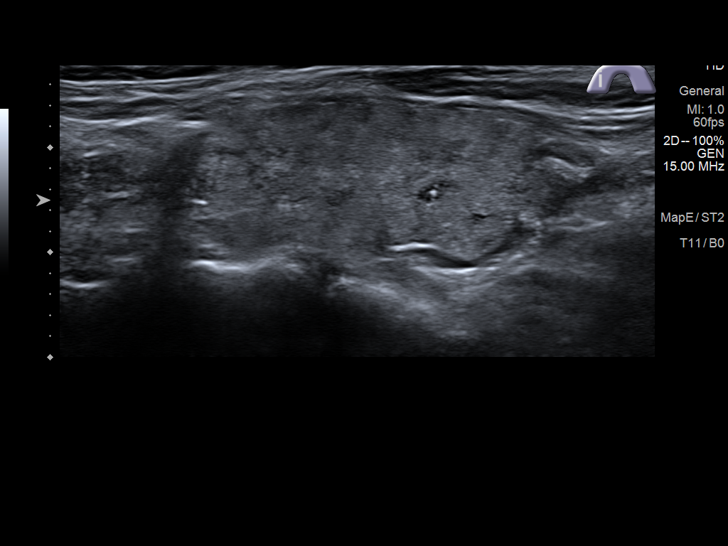
[im 29/63]
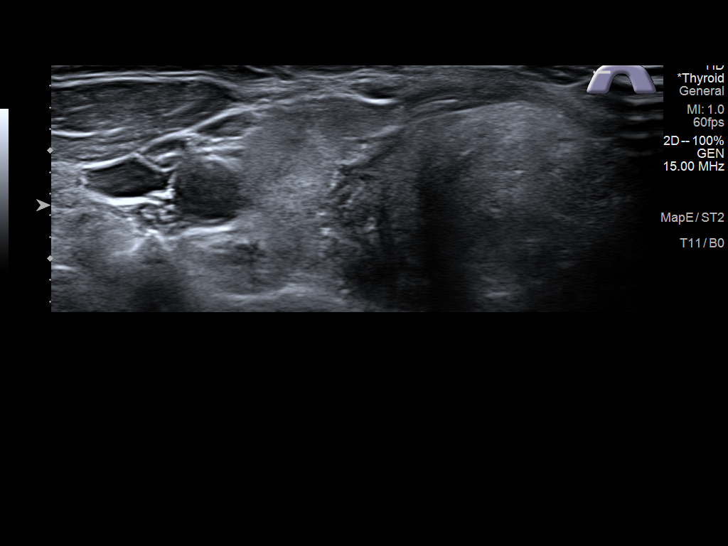
[im 34/63]
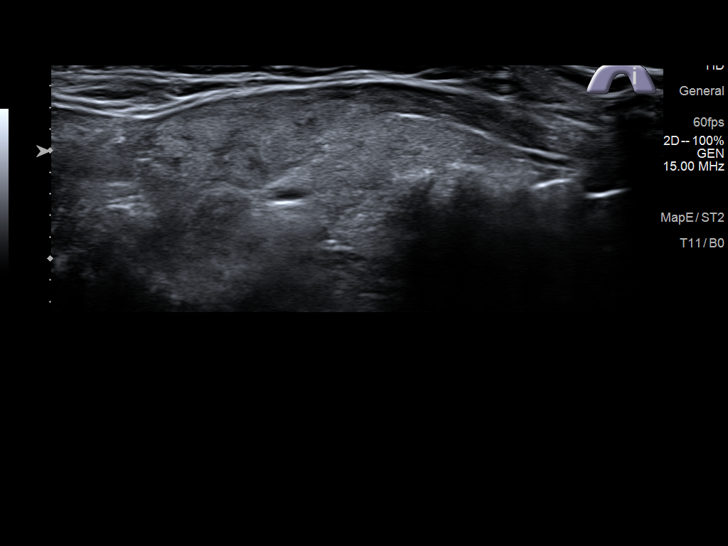
[im 39/63]
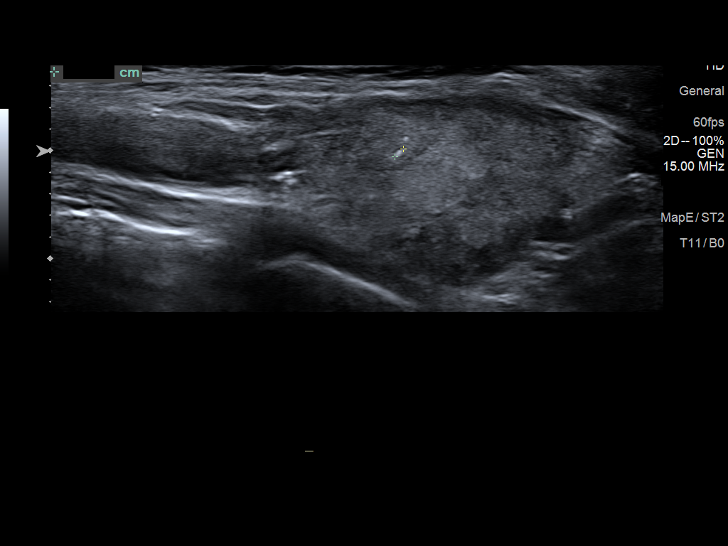
[im 42/63]
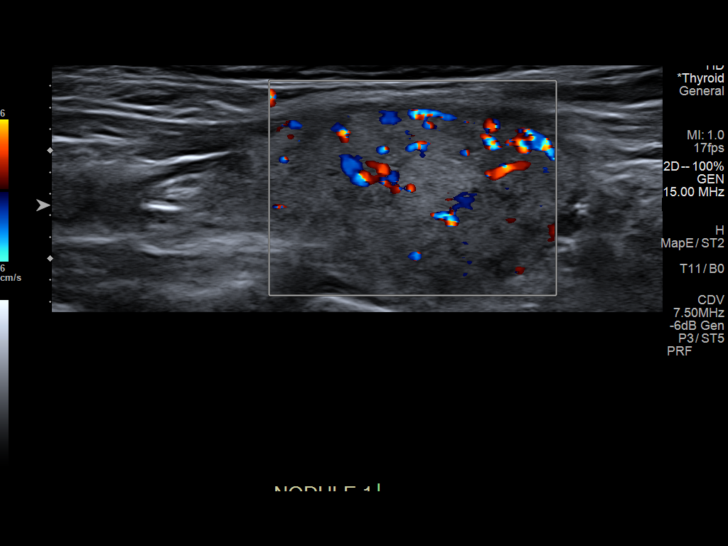
[im 47/63]
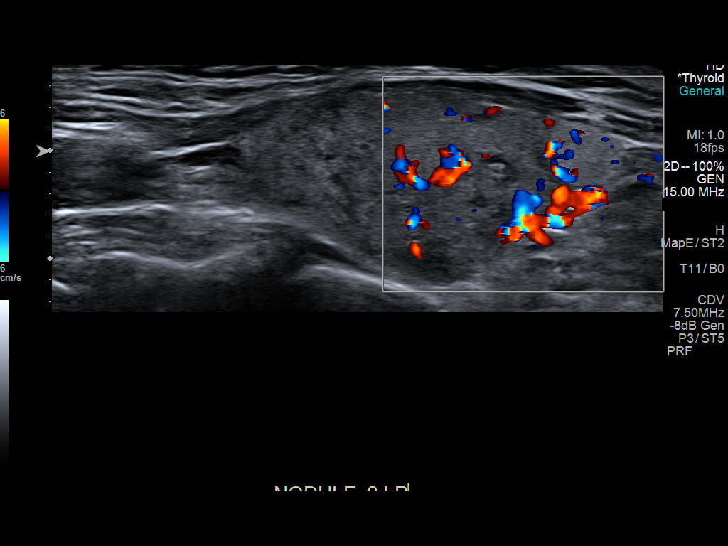
[im 52/63]
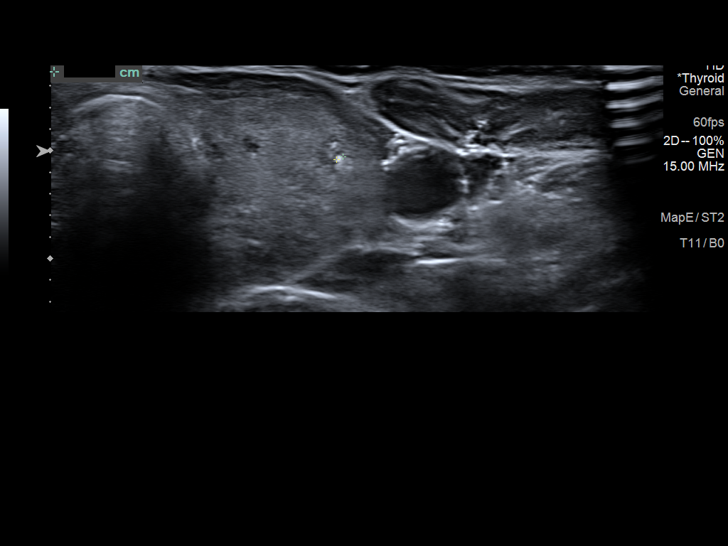
[im 57/63]
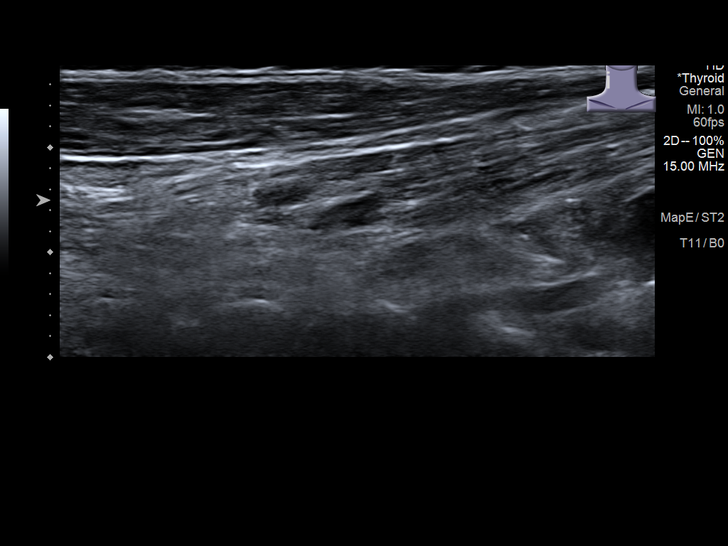
[im 63/63]
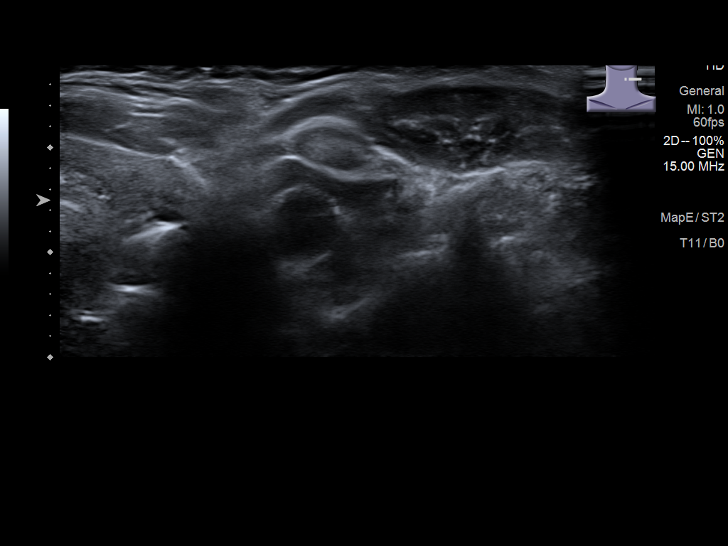

[14 of 25 positions shown; findings below may reference images not displayed]

FINDINGS: Parenchymal Echotexture: Moderately heterogenous

Isthmus: 2 mm

Right lobe: 3.9 x 1.7 x 1.9 cm

Left lobe: 3.9 x 1.8 x 1.6 cm

_________________________________________________________

Estimated total number of nodules >/= 1 cm: 0

Number of spongiform nodules >/=  2 cm not described below (TR1): 0

Number of mixed cystic and solid nodules >/= 1.5 cm not described
below (TR2): 0

_________________________________________________________

Moderate thyroid heterogeneity with prominent vascularity.

Subcentimeter hyperechoic nodules noted in the left mid and lower
poles, 9 mm or less in size. These would not meet criteria for any
biopsy or follow-up and are not fully detailed by TI rads criteria.

No regional adenopathy.
IMPRESSION: Heterogeneous thyroid with prominent vascularity compatible with
thyroiditis.

Left thyroid subcentimeter nodules noted.

The above is in keeping with the ACR TI-RADS recommendations - [HOSPITAL] 0639;[DATE].

## 2024-01-13 ENCOUNTER — Encounter: Payer: Self-pay | Admitting: Nurse Practitioner

## 2024-01-13 ENCOUNTER — Ambulatory Visit (INDEPENDENT_AMBULATORY_CARE_PROVIDER_SITE_OTHER): Admitting: Nurse Practitioner

## 2024-01-13 VITALS — BP 133/57 | HR 81 | Temp 98.2°F | Ht 59.0 in | Wt 91.2 lb

## 2024-01-13 DIAGNOSIS — R399 Unspecified symptoms and signs involving the genitourinary system: Secondary | ICD-10-CM

## 2024-01-13 LAB — URINALYSIS, ROUTINE W REFLEX MICROSCOPIC
Bilirubin, UA: NEGATIVE
Glucose, UA: NEGATIVE
Ketones, UA: NEGATIVE
Leukocytes,UA: NEGATIVE
Nitrite, UA: NEGATIVE
Protein,UA: NEGATIVE
Specific Gravity, UA: 1.015 (ref 1.005–1.030)
Urobilinogen, Ur: 0.2 mg/dL (ref 0.2–1.0)
pH, UA: 5.5 (ref 5.0–7.5)

## 2024-01-13 LAB — MICROSCOPIC EXAMINATION
Bacteria, UA: NONE SEEN
Epithelial Cells (non renal): NONE SEEN /HPF (ref 0–10)
RBC, Urine: NONE SEEN /HPF (ref 0–2)
WBC, UA: NONE SEEN /HPF (ref 0–5)

## 2024-01-13 NOTE — Assessment & Plan Note (Signed)
 Acute for a few days, has been pushing fluids and cranberry juice with some improvement.  UA overall reassuring, only 1+ BLD noted.  ?whether kidney stone that was passed based on her symptoms at time.  Recommend if symptoms recur to return to office for assessment.

## 2024-01-13 NOTE — Progress Notes (Signed)
 BP (!) 133/57   Pulse 81   Temp 98.2 F (36.8 C) (Oral)   Ht 4\' 11"  (1.499 m)   Wt 91 lb 3.2 oz (41.4 kg)   SpO2 98%   BMI 18.42 kg/m    Subjective:    Patient ID: Brandy Chen, female    DOB: 03-Oct-1950, 73 y.o.   MRN: [id number]  HPI: Brandy Chen is a 73 y.o. female  Chief Complaint  Patient presents with   Urinary Tract Infection    Patient states she has been noticing urinary urgency and a dark color to her urine for the last couple of days. States she has been taking cranberry tablets and drinking cranberry juice.    URINARY SYMPTOMS Started to have symptoms a few days ago and tested positive at home.  Dysuria: no Urinary frequency: yes Urgency: yes Small volume voids: no Symptom severity: yes Urinary incontinence: no Foul odor: no Hematuria: no Abdominal pain: no Back pain: yes Suprapubic pain/pressure: no Flank pain: no Fever:  no Vomiting: no Relief with cranberry juice: yes Relief with pyridium: no Status: better Previous urinary tract infection: yes Recurrent urinary tract infection: no Sexual activity: No sexually active History of sexually transmitted disease: no Treatments attempted: cranberry and increasing fluids    Relevant past medical, surgical, family and social history reviewed and updated as indicated. Interim medical history since our last visit reviewed. Allergies and medications reviewed and updated.  Review of Systems  Constitutional:  Negative for activity change, appetite change, diaphoresis, fatigue and fever.  Respiratory:  Negative for cough, chest tightness, shortness of breath and wheezing.   Cardiovascular:  Negative for chest pain, palpitations and leg swelling.  Gastrointestinal: Negative.   Genitourinary:  Positive for flank pain, frequency and urgency. Negative for dysuria and hematuria.  Neurological: Negative.   Psychiatric/Behavioral: Negative.      Per HPI unless specifically indicated above     Objective:     BP (!) 133/57   Pulse 81   Temp 98.2 F (36.8 C) (Oral)   Ht 4\' 11"  (1.499 m)   Wt 91 lb 3.2 oz (41.4 kg)   SpO2 98%   BMI 18.42 kg/m   Wt Readings from Last 3 Encounters:  01/13/24 91 lb 3.2 oz (41.4 kg)  12/07/23 90 lb 12.8 oz (41.2 kg)  05/25/23 92 lb 6.4 oz (41.9 kg)    Physical Exam Vitals and nursing note reviewed.  Constitutional:      General: She is awake. She is not in acute distress.    Appearance: She is well-developed and well-groomed. She is not ill-appearing or toxic-appearing.  HENT:     Head: Normocephalic.     Right Ear: Hearing and external ear normal.     Left Ear: Hearing and external ear normal.  Eyes:     General: Lids are normal.        Right eye: No discharge.        Left eye: No discharge.     Conjunctiva/sclera: Conjunctivae normal.     Pupils: Pupils are equal, round, and reactive to light.  Neck:     Thyroid : No thyromegaly.     Vascular: No carotid bruit.  Cardiovascular:     Rate and Rhythm: Normal rate and regular rhythm.     Heart sounds: Normal heart sounds. No murmur heard.    No gallop.  Pulmonary:     Effort: Pulmonary effort is normal. No accessory muscle usage or respiratory distress.  Breath sounds: Normal breath sounds.  Abdominal:     General: Bowel sounds are normal. There is no distension.     Palpations: Abdomen is soft.     Tenderness: There is no abdominal tenderness. There is no right CVA tenderness or left CVA tenderness.  Musculoskeletal:     Cervical back: Normal range of motion and neck supple.     Right lower leg: No edema.     Left lower leg: No edema.  Lymphadenopathy:     Cervical: No cervical adenopathy.  Skin:    General: Skin is warm and dry.  Neurological:     Mental Status: She is alert and oriented to person, place, and time.     Deep Tendon Reflexes: Reflexes are normal and symmetric.     Reflex Scores:      Brachioradialis reflexes are 2+ on the right side and 2+ on the left side.       Patellar reflexes are 2+ on the right side and 2+ on the left side. Psychiatric:        Attention and Perception: Attention normal.        Mood and Affect: Mood normal.        Speech: Speech normal.        Behavior: Behavior normal. Behavior is cooperative.        Thought Content: Thought content normal.     Results for orders placed or performed in visit on 12/07/23  [id number] 11+Oxyco+Alc+Crt-Bund   Collection Time: 12/07/23  3:08 PM  Result Value Ref Range   Ethanol Negative Cutoff=0.020 %   Amphetamines, Urine Negative Cutoff=1000 ng/mL   Barbiturate Negative Cutoff=200 ng/mL   BENZODIAZ UR QL See Final Results Cutoff=200 ng/mL   Cannabinoid Quant, Ur Negative Cutoff=50 ng/mL   Cocaine (Metabolite) Negative Cutoff=300 ng/mL   OPIATE SCREEN URINE Negative Cutoff=300 ng/mL   Oxycodone/Oxymorphone, Urine Negative Cutoff=300 ng/mL   Phencyclidine Negative Cutoff=25 ng/mL   Methadone Screen, Urine Negative Cutoff=300 ng/mL   Propoxyphene Negative Cutoff=300 ng/mL   Meperidine Negative Cutoff=200 ng/mL   Tramadol Negative Cutoff=200 ng/mL   Creatinine 140.5 20.0 - 300.0 mg/dL   pH, Urine 5.2 4.5 - 8.9  Benzodiazepines Confirm, Urine   Collection Time: 12/07/23  3:08 PM  Result Value Ref Range   Benzodiazepines Positive (A) Cutoff=200 ng/mL   Nordiazepam Negative Cutoff=100   Oxazepam Negative Cutoff=100   Flurazepam Negative Cutoff=100   Lorazepam Negative Cutoff=100   Alprazolam  Positive (A)    Alprazolam  Conf. 287 Cutoff=100 ng/mL   Clonazepam Negative Cutoff=100   Temazepam Negative Cutoff=100   Triazolam Negative Cutoff=100   Midazolam Negative Cutoff=100  CBC with Differential/Platelet   Collection Time: 12/07/23  3:10 PM  Result Value Ref Range   WBC 6.2 3.4 - 10.8 x10E3/uL   RBC 4.37 3.77 - 5.28 x10E6/uL   Hemoglobin 13.2 11.1 - 15.9 g/dL   Hematocrit 04.5 40.9 - 46.6 %   MCV 93 79 - 97 fL   MCH 30.2 26.6 - 33.0 pg   MCHC 32.4 31.5 - 35.7 g/dL   RDW 81.1 91.4 -  78.2 %   Platelets 249 150 - 450 x10E3/uL   Neutrophils 55 Not Estab. %   Lymphs 37 Not Estab. %   Monocytes 6 Not Estab. %   Eos 1 Not Estab. %   Basos 1 Not Estab. %   Neutrophils Absolute 3.4 1.4 - 7.0 x10E3/uL   Lymphocytes Absolute 2.3 0.7 - 3.1 x10E3/uL   Monocytes Absolute 0.4 0.1 -  0.9 x10E3/uL   EOS (ABSOLUTE) 0.1 0.0 - 0.4 x10E3/uL   Basophils Absolute 0.1 0.0 - 0.2 x10E3/uL   Immature Granulocytes 0 Not Estab. %   Immature Grans (Abs) 0.0 0.0 - 0.1 x10E3/uL  Comprehensive metabolic panel   Collection Time: 12/07/23  3:10 PM  Result Value Ref Range   Glucose 83 70 - 99 mg/dL   BUN 13 8 - 27 mg/dL   Creatinine, Ser 1.61 0.57 - 1.00 mg/dL   eGFR 83 >09 UE/AVW/0.98   BUN/Creatinine Ratio 17 12 - 28   Sodium 140 134 - 144 mmol/L   Potassium 4.1 3.5 - 5.2 mmol/L   Chloride 99 96 - 106 mmol/L   CO2 23 20 - 29 mmol/L   Calcium  9.2 8.7 - 10.3 mg/dL   Total Protein 6.6 6.0 - 8.5 g/dL   Albumin 4.5 3.8 - 4.8 g/dL   Globulin, Total 2.1 1.5 - 4.5 g/dL   Bilirubin Total 0.4 0.0 - 1.2 mg/dL   Alkaline Phosphatase 82 44 - 121 IU/L   AST 18 0 - 40 IU/L   ALT 12 0 - 32 IU/L  TSH   Collection Time: 12/07/23  3:10 PM  Result Value Ref Range   TSH 3.170 0.450 - 4.500 uIU/mL  Lipid Panel w/o Chol/HDL Ratio   Collection Time: 12/07/23  3:10 PM  Result Value Ref Range   Cholesterol, Total 203 (H) 100 - 199 mg/dL   Triglycerides 67 0 - 149 mg/dL   HDL 80 >11 mg/dL   VLDL Cholesterol Cal 12 5 - 40 mg/dL   LDL Chol Calc (NIH) 914 (H) 0 - 99 mg/dL  VITAMIN D  25 Hydroxy (Vit-D Deficiency, Fractures)   Collection Time: 12/07/23  3:10 PM  Result Value Ref Range   Vit D, 25-Hydroxy 59.8 30.0 - 100.0 ng/mL  T4, free   Collection Time: 12/07/23  3:10 PM  Result Value Ref Range   Free T4 1.63 0.82 - 1.77 ng/dL      Assessment & Plan:   Problem List Items Addressed This Visit       Other   Urinary symptom or sign - Primary   Acute for a few days, has been pushing fluids and  cranberry juice with some improvement.  UA overall reassuring, only 1+ BLD noted.  ?whether kidney stone that was passed based on her symptoms at time.  Recommend if symptoms recur to return to office for assessment.      Relevant Orders   Urinalysis, Routine w reflex microscopic     Follow up plan: Return if symptoms worsen or fail to improve.

## 2024-01-13 NOTE — Patient Instructions (Signed)
 Kidney Stones Kidney stones are rock-like masses that form inside of the kidneys. Kidneys are organs that make pee (urine). A kidney stone may move into other parts of the urinary tract, including: The tubes that connect the kidneys to the bladder (ureters). The bladder. The tube that carries urine out of the body (urethra). Kidney stones can cause very bad pain and can block the flow of pee. The stone usually leaves your body through your pee. A doctor may need to take out the stone. What are the causes? Kidney stones may be caused by: Too much calcium in the body. This may be caused by too much parathyroid hormone in the blood. Uric acid crystals in the bladder. The body makes uric acid when you eat certain foods. Narrowing of one or both of the ureters. A kidney blockage that you were born with. Past surgery on the kidney or the ureters. What increases the risk? You are more likely to develop this condition if: You have had a kidney stone in the past. Other people in your family have had kidney stones. You do not drink enough water. You eat a diet that is high in protein, salt (sodium), or sugar. You are very overweight (obese). What are the signs or symptoms? Symptoms of a kidney stone may include: Pain in the side of the belly, right below the ribs. Pain usually spreads to the groin. Needing to pee often or right away. Pain when peeing. Blood in your pee. Feeling like you may vomit (nauseous). Vomiting. Fever and chills. How is this treated? Treatment depends on the size, location, and makeup of the kidney stones. The stones will often pass out of the body when you pee. You may need to: Drink more fluid to help pass the stone. In some cases, you may be given fluids through an IV tube at the hospital. Take medicine for pain. Change your diet to help keep kidney stones from coming back. Sometimes, you may need: A procedure to break up kidney stones using a beam of light (laser)  or shock waves. Surgery to remove the kidney stones. Follow these instructions at home: Medicines Take over-the-counter and prescription medicines only as told by your doctor. Ask your doctor if the medicine prescribed to you requires you to avoid driving or using machinery. Eating and drinking Drink enough fluid to keep your pee pale yellow. You may be told to drink at least 8-10 glasses of water each day. This will help you pass the stone. If told by your doctor, change your diet. You may be told to: Limit how much salt you eat. Eat more fruits and vegetables. Limit how much meat, poultry, fish, and eggs you eat. Follow instructions from your doctor about what you may eat and drink. General instructions Collect pee samples as told by your doctor. You may need to collect a pee sample: 24 hours after a stone comes out. 8-12 weeks after a stone comes out, and every 6-12 months after that. Strain your pee every time you pee. Use the strainer that your doctor recommends. Do not throw out the stone. Keep it so that it can be tested by your doctor. Keep all follow-up visits. You may need X-rays and ultrasounds to make sure the stone has come out. How is this prevented? To prevent another kidney stone: Drink enough fluid to keep your pee pale yellow. This is the best way to prevent kidney stones. Eat healthy foods. Avoid certain foods as told by your doctor. You  may be told to eat less protein. Stay at a healthy weight. Where to find more information National Kidney Foundation (NKF): kidney.org [facility]): urologyhealth.org Contact a doctor if: You have pain that gets worse or does not get better with medicine. Get help right away if: You have a fever or chills. You get very bad pain. You get new pain in your belly. You faint. You cannot pee. This information is not intended to replace advice given to you by your health care provider. Make sure you discuss any  questions you have with your health care provider. Document Revised: 05/07/2022 Document Reviewed: 05/07/2022 Elsevier Patient Education  2024 [employer].

## 2024-02-26 ENCOUNTER — Other Ambulatory Visit: Payer: Self-pay | Admitting: Nurse Practitioner

## 2024-02-28 NOTE — Telephone Encounter (Signed)
 Requested medication (s) are due for refill today: yes  Requested medication (s) are on the active medication list: yes  Last refill:  11/28/23 #30 2 RF  Future visit scheduled: yes  Notes to clinic:  med not delegated to NT to RF   Requested Prescriptions  Pending Prescriptions Disp Refills   ALPRAZolam  (XANAX ) 0.5 MG tablet [Pharmacy Med Name: ALPRAZOLAM  0.5 MG TABLET] 30 tablet 2    Sig: TAKE 1 TABLET BY MOUTH EVERYDAY AT BEDTIME     Not Delegated - Psychiatry: Anxiolytics/Hypnotics 2 Failed - 02/28/2024  9:03 AM      Failed - This refill cannot be delegated      Passed - Urine Drug Screen completed in last 360 days      Passed - Patient is not pregnant      Passed - Valid encounter within last 6 months    Recent Outpatient Visits           1 month ago Urinary symptom or sign   [facility] [place], Sanjuan Crumbly T, NP   2 months ago Primary hypertension   [place] [facility] [place], Lavelle Posey, NP

## 2024-03-16 ENCOUNTER — Other Ambulatory Visit: Payer: Self-pay | Admitting: Nurse Practitioner

## 2024-03-19 NOTE — Telephone Encounter (Signed)
 Requested Prescriptions  Pending Prescriptions Disp Refills   levothyroxine  (SYNTHROID ) 50 MCG tablet [Pharmacy Med Name: LEVOTHYROXINE  50 MCG TABLET] 90 tablet 1    Sig: TAKE 1 TABLET BY MOUTH EVERY DAY     Endocrinology:  Hypothyroid Agents Passed - 03/19/2024  3:37 PM      Passed - TSH in normal range and within 360 days    TSH  Date Value Ref Range Status  12/07/2023 3.170 0.450 - 4.500 uIU/mL Final         Passed - Valid encounter within last 12 months    Recent Outpatient Visits           2 months ago Urinary symptom or sign   [facility] [place], [place] T, NP   3 months ago Primary hypertension   [place] [facility] [place], Melanie DASEN, NP

## 2024-04-12 ENCOUNTER — Ambulatory Visit (INDEPENDENT_AMBULATORY_CARE_PROVIDER_SITE_OTHER): Payer: Self-pay

## 2024-04-12 VITALS — BP 125/73 | HR 71 | Ht 59.0 in | Wt 91.0 lb

## 2024-04-12 DIAGNOSIS — Z0000 Encounter for general adult medical examination without abnormal findings: Secondary | ICD-10-CM | POA: Diagnosis not present

## 2024-04-12 DIAGNOSIS — Z5320 Procedure and treatment not carried out because of patient's decision for unspecified reasons: Secondary | ICD-10-CM

## 2024-04-12 NOTE — Patient Instructions (Signed)
 TOGETHER, WE CAME UP WITH THE FOLLOWING GAME PLAN! LETS DO THIS!!!  Referrals: No referrals placed today Follow up Visits Next PCP Visit: June 13 2024 11 am in office 1 Year F/U Wellness Visit: April 16 2025 at 10:00 am telephone visit  We will mail you an Advanced Directives packet as we discussed during your visit today. You do not have to have an attorney to complete this paperwork. Read over the packet, discuss it with family, and complete it. Choose who will be making decisions for you in the event you can no longer make them for yourself. Once completed have them notarized, and bring the packet back to the office. We will scan it and make sure it gets into your chart.   If you choose to have a DNR (Do Not Resuscitate Order) you must get this from your primary care doctor because they have to sign it. You can get this in the office during an appointment.   THIS ORDER MUST BE VISIBLE AT ALL TIMES WITHIN YOUR HOME SUCH AS ON A REFRIGERATOR.   Clinician Recommendations:  Aim for 30 minutes of exercise or brisk walking, 6-8 glasses of water, and 5 servings of fruits and vegetables each day.       This is a list of the screening recommended for you and due dates:  Health Maintenance  Topic Date Due   Zoster (Shingles) Vaccine (1 of 2) Never done   Pneumococcal Vaccine for age over 25 (2 of 2 - PCV20 or PCV21) 02/15/2017   DEXA scan (bone density measurement)  02/17/2017   Flu Shot  04/27/2024   Mammogram  07/27/2024   Cologuard (Stool DNA test)  12/13/2024   Medicare Annual Wellness Visit  04/12/2025   Hepatitis C Screening  Completed   Hepatitis B Vaccine  Aged Out   HPV Vaccine  Aged Out   Meningitis B Vaccine  Aged Out   DTaP/Tdap/Td vaccine  Discontinued   Stool Blood Test  Discontinued   COVID-19 Vaccine  Discontinued    Advanced directives: (Provided) Advance directive discussed with you today. I have provided a copy for you to complete at home and have notarized. Once  this is complete, please bring a copy in to our office so we can scan it into your chart.  Advance Care Planning is important because it:  [x]  Makes sure you receive the medical care that is consistent with your values, goals, and preferences  [x]  It provides guidance to your family and loved ones and reduces their decisional burden about whether or not they are making the right decisions based on your wishes.  Follow the link provided in your after visit summary or read over the paperwork we have mailed to you to help you started getting your Advance Directives in place. If you need assistance in completing these, please reach out to us  so that we can help you!  We will mail you an Advanced Directives packet as we discussed during your visit today. You do not have to have an attorney to complete this paperwork. Read over the packet, discuss it with family, and complete it. Choose who will be making decisions for you in the event you can no longer make them for yourself. Once completed have them notarized, and bring the packet back to the office. We will scan it and make sure it gets into your chart.   If you choose to have a DNR (Do Not Resuscitate Order) you must get this from your  primary care doctor because they have to sign it. You can get this in the office during an appointment.   THIS ORDER MUST BE VISIBLE AT ALL TIMES WITHIN YOUR HOME SUCH AS ON A REFRIGERATOR.

## 2024-04-12 NOTE — Progress Notes (Signed)
 Patient declined dexa scan Subjective:   Brandy Chen is a 73 y.o. who presents for a Medicare Wellness preventive visit.  As a reminder, Annual Wellness Visits don't include a physical exam, and some assessments may be limited, especially if this visit is performed virtually. We may recommend an in-person follow-up visit with your provider if needed.  Visit Complete: Virtual I connected with  Brandy Chen on 04/12/24 by a audio enabled telemedicine application and verified that I am speaking with the correct person using two identifiers.  Patient Location: Home  Provider Location: Home Office  I discussed the limitations of evaluation and management by telemedicine. The patient expressed understanding and agreed to proceed.  Vital Signs: Because this visit was a virtual/telehealth visit, some criteria may be missing or patient reported. Any vitals not documented were not able to be obtained and vitals that have been documented are patient reported.  VideoDeclined- This patient declined Librarian, academic. Therefore the visit was completed with audio only.  Persons Participating in Visit: Patient.  AWV Questionnaire: No: Patient Medicare AWV questionnaire was not completed prior to this visit.  Cardiac Risk Factors include: advanced age (>45men, >49 women);dyslipidemia;hypertension     Objective:    Today's Vitals   04/12/24 0929  BP: 125/73  Pulse: 71  Weight: 91 lb (41.3 kg)  Height: 4' 11 (1.499 m)   Body mass index is 18.38 kg/m.     04/12/2024    9:23 AM 04/04/2023   10:04 AM 03/16/2022   10:37 AM 03/09/2021   10:32 AM 03/05/2020   10:30 AM 03/26/2019   10:36 AM 03/20/2018   11:20 AM  Advanced Directives  Does Patient Have a Medical Advance Directive? No No No No No No No   Does patient want to make changes to medical advance directive?     Yes (MAU/Ambulatory/Procedural Areas - Information given)    Would patient like information on  creating a medical advance directive? Yes (MAU/Ambulatory/Procedural Areas - Information given) No - Patient declined No - Patient declined    Yes (MAU/Ambulatory/Procedural Areas - Information given)      Data saved with a previous flowsheet row definition    Current Medications (verified) Outpatient Encounter Medications as of 04/12/2024  Medication Sig   ALPRAZolam  (XANAX ) 0.5 MG tablet TAKE 1 TABLET BY MOUTH EVERYDAY AT BEDTIME   aspirin EC 81 MG tablet Take 81 mg by mouth.   Calcium  Carb-Cholecalciferol 600-800 MG-UNIT TABS Take 1 tablet by mouth 2 (two) times daily.   Cetirizine HCl 10 MG CAPS Take 1 capsule by mouth daily.   chlorhexidine (PERIDEX) 0.12 % solution 15 mLs 2 (two) times daily. (Patient taking differently: Use as directed 15 mLs in the mouth or throat 2 (two) times daily as needed (dentist advised patient to only use on a prn basis).)   COLLAGEN-VITAMIN C PO Take 1 capsule by mouth daily.   Krill Oil 300 MG CAPS Take 300 mg by mouth daily.   levothyroxine  (SYNTHROID ) 50 MCG tablet TAKE 1 TABLET BY MOUTH EVERY DAY   Magnesium 500 MG TABS Take 500 mg by mouth daily.   meclizine  (ANTIVERT ) 25 MG tablet TAKE 1 TABLET BY MOUTH 3 TIMES DAILY AS NEEDED FOR DIZZINESS.   Plant Sterols and Stanols (CHOLESTOFF) 450 MG TABS Take 1 tablet by mouth every other day.   vitamin C (ASCORBIC ACID) 500 MG tablet Take 500 mg by mouth daily.   Collagen-Vitamin C-Biotin (COLLAGEN) 500-50-0.8 MG CAPS Take 1  capsule by mouth every other day. (Patient not taking: Reported on 04/12/2024)   No facility-administered encounter medications on file as of 04/12/2024.    Allergies (verified) Sulfa antibiotics   History: Past Medical History:  Diagnosis Date   Anxiety    Depression    Endometriosis    Fatigue    Hyperlipidemia    Hypertension    Insomnia    Lumbago    Osteoporosis    Thyroid  disease    Vitamin D  deficiency    Past Surgical History:  Procedure Laterality Date   CESAREAN  SECTION     pyloric stenosis     TUBAL LIGATION     Family History  Problem Relation Age of Onset   Stroke Mother    Hypertension Mother    Stroke Maternal Grandfather    Hypertension Sister    Social History   Socioeconomic History   Marital status: Married    Spouse name: Not on file   Number of children: Not on file   Years of education: Not on file   Highest education level: High school graduate  Occupational History   Not on file  Tobacco Use   Smoking status: Never   Smokeless tobacco: Never  Vaping Use   Vaping status: Never Used  Substance and Sexual Activity   Alcohol use: No    Alcohol/week: 0.0 standard drinks of alcohol   Drug use: Yes    Types: Benzodiazepines   Sexual activity: Not Currently  Other Topics Concern   Not on file  Social History Narrative   Not on file   Social Drivers of Health   Financial Resource Strain: Low Risk  (04/12/2024)   Overall Financial Resource Strain (CARDIA)    Difficulty of Paying Living Expenses: Not hard at all  Food Insecurity: No Food Insecurity (04/12/2024)   Hunger Vital Sign    Worried About Running Out of Food in the Last Year: Never true    Ran Out of Food in the Last Year: Never true  Transportation Needs: No Transportation Needs (04/12/2024)   PRAPARE - Administrator, Civil Service (Medical): No    Lack of Transportation (Non-Medical): No  Physical Activity: Sufficiently Active (04/12/2024)   Exercise Vital Sign    Days of Exercise per Week: 7 days    Minutes of Exercise per Session: 30 min  Stress: No Stress Concern Present (04/12/2024)   [employer] of Occupational Health - Occupational Stress Questionnaire    Feeling of Stress: Only a little  Social Connections: Moderately Isolated (04/12/2024)   Social Connection and Isolation Panel    Frequency of Communication with Friends and Family: More than three times a week    Frequency of Social Gatherings with Friends and Family: Once a week     Attends Religious Services: Never    Database administrator or Organizations: No    Attends Engineer, structural: Never    Marital Status: Married    Tobacco Counseling Counseling given: Yes    Clinical Intake:  Pre-visit preparation completed: Yes  Pain : No/denies pain     BMI - recorded: 18.38 Nutritional Risks: None Diabetes: No  No results found for: HGBA1C   How often do you need to have someone help you when you read instructions, pamphlets, or other written materials from your doctor or pharmacy?: 1 - Never  Interpreter Needed?: No  Information entered by :: A Narelle Schoening CMA-AAMA   Activities of Daily Living  04/12/2024    9:34 AM  In your present state of health, do you have any difficulty performing the following activities:  Hearing? 0  Vision? 0  Difficulty concentrating or making decisions? 0  Walking or climbing stairs? 0  Dressing or bathing? 0  Doing errands, shopping? 0  Preparing Food and eating ? N  Using the Toilet? N  In the past six months, have you accidently leaked urine? N  Do you have problems with loss of bowel control? N  Managing your Medications? N  Managing your Finances? N  Housekeeping or managing your Housekeeping? N    Patient Care Team: Cannady, Jolene T, NP as PCP - General (Nurse Practitioner) Roni, [facility] as Consulting Physician (Optometry) [place], [place] as Consulting Physician (Optometry) Legrand Norris, [place] as Consulting Physician (Otolaryngology) Mevelyn JONETTA Bathe, OD as Consulting Physician (Optometry)  I have updated your Care Teams any recent Medical Services you may have received from other providers in the past year.     Assessment:   This is a routine wellness examination for Tebbetts.  Hearing/Vision screen Hearing Screening - Comments:: Patient denies any hearing difficulties.   Vision Screening - Comments:: Wears rx glasses - up to date with routine eye exams with  Dr.  Mevelyn in [place]    Goals Addressed             This Visit's Progress    Patient Stated       To remain active and healthy and continue to be able to do my daily activities.        Depression Screen     04/12/2024    9:37 AM 12/07/2023    2:49 PM 05/25/2023   10:51 AM 04/04/2023   10:02 AM 02/17/2023   11:07 AM 11/18/2022   10:48 AM 07/29/2022   10:45 AM  PHQ 2/9 Scores  PHQ - 2 Score 1 0 0 0 0 0 0  PHQ- 9 Score 2 1 1  0 2 1 0    Fall Risk     04/12/2024    9:33 AM 12/07/2023    2:49 PM 04/04/2023   10:04 AM 02/17/2023   11:06 AM 01/17/2023    1:34 PM  Fall Risk   Falls in the past year? 0 0 0 0 0  Number falls in past yr: 0 0 0 0 0  Injury with Fall? 0 0 0 0 0  Risk for fall due to : No Fall Risks No Fall Risks No Fall Risks No Fall Risks No Fall Risks  Follow up Falls evaluation completed;Education provided;Falls prevention discussed Falls evaluation completed Falls prevention discussed;Falls evaluation completed Falls evaluation completed Falls evaluation completed    MEDICARE RISK AT HOME:  Medicare Risk at Home Any stairs in or around the home?: Yes If so, are there any without handrails?: No Home free of loose throw rugs in walkways, pet beds, electrical cords, etc?: Yes Adequate lighting in your home to reduce risk of falls?: Yes Life alert?: No Use of a cane, walker or w/c?: No Grab bars in the bathroom?: No Shower chair or bench in shower?: No Elevated toilet seat or a handicapped toilet?: No  TIMED UP AND GO:  Was the test performed?  No  Cognitive Function: 6CIT completed        04/12/2024    9:35 AM 04/04/2023   10:05 AM 03/16/2022   10:34 AM 03/09/2021   10:35 AM 03/26/2019   10:40 AM  6CIT Screen  What Year? 0 points 0 points 0 points 0 points 0 points  What month? 0 points 0 points 0 points 0 points 0 points  What time? 0 points 0 points 0 points 0 points 0 points  Count back from 20 0 points 0 points 0 points 0 points 0 points  Months in reverse  0 points 0 points 0 points 0 points 0 points  Repeat phrase 0 points 0 points 0 points 0 points 0 points  Total Score 0 points 0 points 0 points 0 points 0 points    Immunizations Immunization History  Administered Date(s) Administered   Fluad Quad(high Dose 65+) 06/25/2019, 07/09/2020, 07/15/2021, 07/29/2022, 07/20/2023   Influenza, High Dose Seasonal PF 08/23/2016, 08/04/2017, 07/17/2018   Influenza-Unspecified 06/27/2015   Moderna Sars-Covid-2 Vaccination 11/02/2019, 12/04/2019   Pneumococcal Conjugate-13 02/16/2016    Screening Tests Health Maintenance  Topic Date Due   Zoster Vaccines- Shingrix (1 of 2) Never done   Pneumococcal Vaccine: 50+ Years (2 of 2 - PCV20 or PCV21) 02/15/2017   DEXA SCAN  02/17/2017   INFLUENZA VACCINE  04/27/2024   MAMMOGRAM  07/27/2024   Fecal DNA (Cologuard)  12/13/2024   Medicare Annual Wellness (AWV)  04/12/2025   Hepatitis C Screening  Completed   Hepatitis B Vaccines  Aged Out   HPV VACCINES  Aged Out   Meningococcal B Vaccine  Aged Out   DTaP/Tdap/Td  Discontinued   COLON CANCER SCREENING ANNUAL FOBT  Discontinued   COVID-19 Vaccine  Discontinued    Health Maintenance  Health Maintenance Due  Topic Date Due   Zoster Vaccines- Shingrix (1 of 2) Never done   Pneumococcal Vaccine: 50+ Years (2 of 2 - PCV20 or PCV21) 02/15/2017   DEXA SCAN  02/17/2017   Health Maintenance Items Addressed:  Patient declined osteoporosis screening  Additional Screening:  Vision Screening: Recommended annual ophthalmology exams for early detection of glaucoma and other disorders of the eye. Would you like a referral to an eye doctor? No    Dental Screening: Recommended annual dental exams for proper oral hygiene  Community Resource Referral / Chronic Care Management: CRR required this visit?  No   CCM required this visit?  No   Plan:    I have personally reviewed and noted the following in the patient's chart:   Medical and social  history Use of alcohol, tobacco or illicit drugs  Current medications and supplements including opioid prescriptions. Patient is not currently taking opioid prescriptions. Functional ability and status Nutritional status Physical activity Advanced directives List of other physicians Hospitalizations, surgeries, and ER visits in previous 12 months Vitals Screenings to include cognitive, depression, and falls Referrals and appointments  In addition, I have reviewed and discussed with patient certain preventive protocols, quality metrics, and best practice recommendations. A written personalized care plan for preventive services as well as general preventive health recommendations were provided to patient.   Lanette Ell, CMA   04/12/2024   After Visit Summary: (Mail) Due to this being a telephonic visit, the after visit summary with patients personalized plan was offered to patient via mail   Notes: Nothing significant to report at this time.

## 2024-05-22 DIAGNOSIS — J301 Allergic rhinitis due to pollen: Secondary | ICD-10-CM | POA: Diagnosis not present

## 2024-05-22 DIAGNOSIS — J340 Abscess, furuncle and carbuncle of nose: Secondary | ICD-10-CM | POA: Diagnosis not present

## 2024-05-22 DIAGNOSIS — H6063 Unspecified chronic otitis externa, bilateral: Secondary | ICD-10-CM | POA: Diagnosis not present

## 2024-05-22 DIAGNOSIS — L03211 Cellulitis of face: Secondary | ICD-10-CM | POA: Diagnosis not present

## 2024-05-22 DIAGNOSIS — H6123 Impacted cerumen, bilateral: Secondary | ICD-10-CM | POA: Diagnosis not present

## 2024-06-10 NOTE — Patient Instructions (Incomplete)
 Be Involved in Caring For Your Health:  Taking Medications When medications are taken as directed, they can greatly improve your health. But if they are not taken as prescribed, they may not work. In some cases, not taking them correctly can be harmful. To help ensure your treatment remains effective and safe, understand your medications and how to take them. Bring your medications to each visit for review by your provider.  Your lab results, notes, and after visit summary will be available on My Chart. We strongly encourage you to use this feature. If lab results are abnormal the clinic will contact you with the appropriate steps. If the clinic does not contact you assume the results are satisfactory. You can always view your results on My Chart. If you have questions regarding your health or results, please contact the clinic during office hours. You can also ask questions on My Chart.  We at [facility] are grateful that you chose us  to provide your care. We strive to provide evidence-based and compassionate care and are always looking for feedback. If you get a survey from the clinic please complete this so we can hear your opinions.  Healthy Eating, Adult Healthy eating may help you get and keep a healthy body weight, reduce the risk of chronic disease, and live a long and productive life. It is important to follow a healthy eating pattern. Your nutritional and calorie needs should be met mainly by different nutrient-rich foods. What are tips for following this plan? Reading food labels Read labels and choose the following: Reduced or low sodium products. Juices with 100% fruit juice. Foods with low saturated fats (<3 g per serving) and high polyunsaturated and monounsaturated fats. Foods with whole grains, such as whole wheat, cracked wheat, brown rice, and wild rice. Whole grains that are fortified with folic acid. This is recommended for females who are pregnant or who want to  become pregnant. Read labels and do not eat or drink the following: Foods or drinks with added sugars. These include foods that contain brown sugar, corn sweetener, corn syrup, dextrose , fructose, glucose, high-fructose corn syrup, honey, invert sugar, lactose, malt syrup, maltose, molasses, raw sugar, sucrose, trehalose, or turbinado sugar. Limit your intake of added sugars to less than 10% of your total daily calories. Do not eat more than the following amounts of added sugar per day: 6 teaspoons (25 g) for females. 9 teaspoons (38 g) for males. Foods that contain processed or refined starches and grains. Refined grain products, such as white flour, degermed cornmeal, white bread, and white rice. Shopping Choose nutrient-rich snacks, such as vegetables, whole fruits, and nuts. Avoid high-calorie and high-sugar snacks, such as potato chips, fruit snacks, and candy. Use oil-based dressings and spreads on foods instead of solid fats such as butter, margarine, sour cream, or cream cheese. Limit pre-made sauces, mixes, and instant products such as flavored rice, instant noodles, and ready-made pasta. Try more plant-protein sources, such as tofu, tempeh, black beans, edamame, lentils, nuts, and seeds. Explore eating plans such as the Mediterranean diet or vegetarian diet. Try heart-healthy dips made with beans and healthy fats like hummus and guacamole. Vegetables go great with these. Cooking Use oil to saut or stir-fry foods instead of solid fats such as butter, margarine, or lard. Try baking, boiling, grilling, or broiling instead of frying. Remove the fatty part of meats before cooking. Steam vegetables in water  or broth. Meal planning  At meals, imagine dividing your plate into fourths: One-half of  your plate is fruits and vegetables. One-fourth of your plate is whole grains. One-fourth of your plate is protein, especially lean meats, poultry, eggs, tofu, beans, or nuts. Include low-fat  dairy as part of your daily diet. Lifestyle Choose healthy options in all settings, including home, work, school, restaurants, or stores. Prepare your food safely: Wash your hands after handling raw meats. Where you prepare food, keep surfaces clean by regularly washing with hot, soapy water . Keep raw meats separate from ready-to-eat foods, such as fruits and vegetables. Cook seafood, meat, poultry, and eggs to the recommended temperature. Get a food thermometer. Store foods at safe temperatures. In general: Keep cold foods at 84F (4.4C) or below. Keep hot foods at 184F (60C) or above. Keep your freezer at [facility] (-17.8C) or below. Foods are not safe to eat if they have been between the temperatures of 40-184F (4.4-60C) for more than 2 hours. What foods should I eat? Fruits Aim to eat 1-2 cups of fresh, canned (in natural juice), or frozen fruits each day. One cup of fruit equals 1 small apple, 1 large banana, 8 large strawberries, 1 cup (237 g) canned fruit,  cup (82 g) dried fruit, or 1 cup (240 mL) 100% juice. Vegetables Aim to eat 2-4 cups of fresh and frozen vegetables each day, including different varieties and colors. One cup of vegetables equals 1 cup (91 g) broccoli or cauliflower florets, 2 medium carrots, 2 cups (150 g) raw, leafy greens, 1 large tomato, 1 large bell pepper, 1 large sweet potato, or 1 medium white potato. Grains Aim to eat 5-10 ounce-equivalents of whole grains each day. Examples of 1 ounce-equivalent of grains include 1 slice of bread, 1 cup (40 g) ready-to-eat cereal, 3 cups (24 g) popcorn, or  cup (93 g) cooked rice. Meats and other proteins Try to eat 5-7 ounce-equivalents of protein each day. Examples of 1 ounce-equivalent of protein include 1 egg,  oz nuts (12 almonds, 24 pistachios, or 7 walnut halves), 1/4 cup (90 g) cooked beans, 6 tablespoons (90 g) hummus or 1 tablespoon (16 g) peanut butter. A cut of meat or fish that is the size of a deck of  cards is about 3-4 ounce-equivalents (85 g). Of the protein you eat each week, try to have at least 8 sounce (227 g) of seafood. This is about 2 servings per week. This includes salmon, trout, herring, sardines, and anchovies. Dairy Aim to eat 3 cup-equivalents of fat-free or low-fat dairy each day. Examples of 1 cup-equivalent of dairy include 1 cup (240 mL) milk, 8 ounces (250 g) yogurt, 1 ounces (44 g) natural cheese, or 1 cup (240 mL) fortified soy milk. Fats and oils Aim for about 5 teaspoons (21 g) of fats and oils per day. Choose monounsaturated fats, such as canola and olive oils, mayonnaise made with olive oil or avocado oil, avocados, peanut butter, and most nuts, or polyunsaturated fats, such as sunflower, corn, and soybean oils, walnuts, pine nuts, sesame seeds, sunflower seeds, and flaxseed. Beverages Aim for 6 eight-ounce glasses of water  per day. Limit coffee to 3-5 eight-ounce cups per day. Limit caffeinated beverages that have added calories, such as soda and energy drinks. If you drink alcohol: Limit how much you have to: 0-1 drink a day if you are female. 0-2 drinks a day if you are female. Know how much alcohol is in your drink. In the [place] one drink is one 12 oz bottle of beer (355 mL), one 5 oz glass of wine (  148 mL), or one 1 oz glass of hard liquor (44 mL). Seasoning and other foods Try not to add too much salt to your food. Try using herbs and spices instead of salt. Try not to add sugar to food. This information is based on [place] nutrition guidelines. To learn more, visit DisposableNylon.be. Exact amounts may vary. You may need different amounts. This information is not intended to replace advice given to you by your health care provider. Make sure you discuss any questions you have with your health care provider. Document Revised: 06/14/2022 Document Reviewed: 06/14/2022 Elsevier Patient Education  2024 [employer].

## 2024-06-13 ENCOUNTER — Ambulatory Visit: Admitting: Nurse Practitioner

## 2024-06-13 DIAGNOSIS — F419 Anxiety disorder, unspecified: Secondary | ICD-10-CM

## 2024-06-13 DIAGNOSIS — Z79899 Other long term (current) drug therapy: Secondary | ICD-10-CM

## 2024-06-13 DIAGNOSIS — Z23 Encounter for immunization: Secondary | ICD-10-CM

## 2024-06-13 DIAGNOSIS — E063 Autoimmune thyroiditis: Secondary | ICD-10-CM

## 2024-06-13 DIAGNOSIS — E7800 Pure hypercholesterolemia, unspecified: Secondary | ICD-10-CM

## 2024-06-13 DIAGNOSIS — I10 Essential (primary) hypertension: Secondary | ICD-10-CM

## 2024-06-29 ENCOUNTER — Other Ambulatory Visit: Payer: Self-pay | Admitting: Nurse Practitioner

## 2024-07-02 NOTE — Telephone Encounter (Signed)
 Requested medication (s) are due for refill today: yes  Requested medication (s) are on the active medication list: ys  Last refill:  02/28/24 #30 3 RF  Future visit scheduled: yes  Notes to clinic:  med not delegated to NT to RF   Requested Prescriptions  Pending Prescriptions Disp Refills   ALPRAZolam  (XANAX ) 0.5 MG tablet [Pharmacy Med Name: ALPRAZOLAM  0.5 MG TABLET] 30 tablet 3    Sig: TAKE 1 TABLET BY MOUTH EVERYDAY AT BEDTIME     Not Delegated - Psychiatry: Anxiolytics/Hypnotics 2 Failed - 07/02/2024  1:35 PM      Failed - This refill cannot be delegated      Passed - Urine Drug Screen completed in last 360 days      Passed - Patient is not pregnant      Passed - Valid encounter within last 6 months    Recent Outpatient Visits           5 months ago Urinary symptom or sign   [facility] [place], Melanie T, NP   6 months ago Primary hypertension   [place] [facility] [place], Melanie DASEN, NP

## 2024-07-10 ENCOUNTER — Ambulatory Visit: Admitting: Nurse Practitioner

## 2024-07-28 NOTE — Patient Instructions (Signed)
 Be Involved in Caring For Your Health:  Taking Medications When medications are taken as directed, they can greatly improve your health. But if they are not taken as prescribed, they may not work. In some cases, not taking them correctly can be harmful. To help ensure your treatment remains effective and safe, understand your medications and how to take them. Bring your medications to each visit for review by your provider.  Your lab results, notes, and after visit summary will be available on My Chart. We strongly encourage you to use this feature. If lab results are abnormal the clinic will contact you with the appropriate steps. If the clinic does not contact you assume the results are satisfactory. You can always view your results on My Chart. If you have questions regarding your health or results, please contact the clinic during office hours. You can also ask questions on My Chart.  We at [facility] are grateful that you chose us  to provide your care. We strive to provide evidence-based and compassionate care and are always looking for feedback. If you get a survey from the clinic please complete this so we can hear your opinions.  Healthy Eating, Adult Healthy eating may help you get and keep a healthy body weight, reduce the risk of chronic disease, and live a long and productive life. It is important to follow a healthy eating pattern. Your nutritional and calorie needs should be met mainly by different nutrient-rich foods. What are tips for following this plan? Reading food labels Read labels and choose the following: Reduced or low sodium products. Juices with 100% fruit juice. Foods with low saturated fats (<3 g per serving) and high polyunsaturated and monounsaturated fats. Foods with whole grains, such as whole wheat, cracked wheat, brown rice, and wild rice. Whole grains that are fortified with folic acid. This is recommended for females who are pregnant or who want to  become pregnant. Read labels and do not eat or drink the following: Foods or drinks with added sugars. These include foods that contain brown sugar, corn sweetener, corn syrup, dextrose , fructose, glucose, high-fructose corn syrup, honey, invert sugar, lactose, malt syrup, maltose, molasses, raw sugar, sucrose, trehalose, or turbinado sugar. Limit your intake of added sugars to less than 10% of your total daily calories. Do not eat more than the following amounts of added sugar per day: 6 teaspoons (25 g) for females. 9 teaspoons (38 g) for males. Foods that contain processed or refined starches and grains. Refined grain products, such as white flour, degermed cornmeal, white bread, and white rice. Shopping Choose nutrient-rich snacks, such as vegetables, whole fruits, and nuts. Avoid high-calorie and high-sugar snacks, such as potato chips, fruit snacks, and candy. Use oil-based dressings and spreads on foods instead of solid fats such as butter, margarine, sour cream, or cream cheese. Limit pre-made sauces, mixes, and instant products such as flavored rice, instant noodles, and ready-made pasta. Try more plant-protein sources, such as tofu, tempeh, black beans, edamame, lentils, nuts, and seeds. Explore eating plans such as the Mediterranean diet or vegetarian diet. Try heart-healthy dips made with beans and healthy fats like hummus and guacamole. Vegetables go great with these. Cooking Use oil to saut or stir-fry foods instead of solid fats such as butter, margarine, or lard. Try baking, boiling, grilling, or broiling instead of frying. Remove the fatty part of meats before cooking. Steam vegetables in water  or broth. Meal planning  At meals, imagine dividing your plate into fourths: One-half of  your plate is fruits and vegetables. One-fourth of your plate is whole grains. One-fourth of your plate is protein, especially lean meats, poultry, eggs, tofu, beans, or nuts. Include low-fat  dairy as part of your daily diet. Lifestyle Choose healthy options in all settings, including home, work, school, restaurants, or stores. Prepare your food safely: Wash your hands after handling raw meats. Where you prepare food, keep surfaces clean by regularly washing with hot, soapy water . Keep raw meats separate from ready-to-eat foods, such as fruits and vegetables. Cook seafood, meat, poultry, and eggs to the recommended temperature. Get a food thermometer. Store foods at safe temperatures. In general: Keep cold foods at 84F (4.4C) or below. Keep hot foods at 184F (60C) or above. Keep your freezer at [facility] (-17.8C) or below. Foods are not safe to eat if they have been between the temperatures of 40-184F (4.4-60C) for more than 2 hours. What foods should I eat? Fruits Aim to eat 1-2 cups of fresh, canned (in natural juice), or frozen fruits each day. One cup of fruit equals 1 small apple, 1 large banana, 8 large strawberries, 1 cup (237 g) canned fruit,  cup (82 g) dried fruit, or 1 cup (240 mL) 100% juice. Vegetables Aim to eat 2-4 cups of fresh and frozen vegetables each day, including different varieties and colors. One cup of vegetables equals 1 cup (91 g) broccoli or cauliflower florets, 2 medium carrots, 2 cups (150 g) raw, leafy greens, 1 large tomato, 1 large bell pepper, 1 large sweet potato, or 1 medium white potato. Grains Aim to eat 5-10 ounce-equivalents of whole grains each day. Examples of 1 ounce-equivalent of grains include 1 slice of bread, 1 cup (40 g) ready-to-eat cereal, 3 cups (24 g) popcorn, or  cup (93 g) cooked rice. Meats and other proteins Try to eat 5-7 ounce-equivalents of protein each day. Examples of 1 ounce-equivalent of protein include 1 egg,  oz nuts (12 almonds, 24 pistachios, or 7 walnut halves), 1/4 cup (90 g) cooked beans, 6 tablespoons (90 g) hummus or 1 tablespoon (16 g) peanut butter. A cut of meat or fish that is the size of a deck of  cards is about 3-4 ounce-equivalents (85 g). Of the protein you eat each week, try to have at least 8 sounce (227 g) of seafood. This is about 2 servings per week. This includes salmon, trout, herring, sardines, and anchovies. Dairy Aim to eat 3 cup-equivalents of fat-free or low-fat dairy each day. Examples of 1 cup-equivalent of dairy include 1 cup (240 mL) milk, 8 ounces (250 g) yogurt, 1 ounces (44 g) natural cheese, or 1 cup (240 mL) fortified soy milk. Fats and oils Aim for about 5 teaspoons (21 g) of fats and oils per day. Choose monounsaturated fats, such as canola and olive oils, mayonnaise made with olive oil or avocado oil, avocados, peanut butter, and most nuts, or polyunsaturated fats, such as sunflower, corn, and soybean oils, walnuts, pine nuts, sesame seeds, sunflower seeds, and flaxseed. Beverages Aim for 6 eight-ounce glasses of water  per day. Limit coffee to 3-5 eight-ounce cups per day. Limit caffeinated beverages that have added calories, such as soda and energy drinks. If you drink alcohol: Limit how much you have to: 0-1 drink a day if you are female. 0-2 drinks a day if you are female. Know how much alcohol is in your drink. In the [place] one drink is one 12 oz bottle of beer (355 mL), one 5 oz glass of wine (  148 mL), or one 1 oz glass of hard liquor (44 mL). Seasoning and other foods Try not to add too much salt to your food. Try using herbs and spices instead of salt. Try not to add sugar to food. This information is based on [place] nutrition guidelines. To learn more, visit DisposableN[employer].be. Exact amounts may vary. You may need different amounts. This information is not intended to replace advice given to you by your health care provider. Make sure you discuss any questions you have with your health care provider. Document Revised: 06/14/2022 Document Reviewed: 06/14/2022 Elsevier Patient Education  2024 [employer].

## 2024-07-30 ENCOUNTER — Ambulatory Visit (INDEPENDENT_AMBULATORY_CARE_PROVIDER_SITE_OTHER): Admitting: Nurse Practitioner

## 2024-07-30 ENCOUNTER — Ambulatory Visit: Payer: Self-pay | Admitting: Nurse Practitioner

## 2024-07-30 ENCOUNTER — Encounter: Payer: Self-pay | Admitting: Nurse Practitioner

## 2024-07-30 VITALS — BP 132/74 | HR 68 | Temp 97.8°F | Ht 59.0 in | Wt 95.4 lb

## 2024-07-30 DIAGNOSIS — Z79899 Other long term (current) drug therapy: Secondary | ICD-10-CM | POA: Diagnosis not present

## 2024-07-30 DIAGNOSIS — F419 Anxiety disorder, unspecified: Secondary | ICD-10-CM | POA: Diagnosis not present

## 2024-07-30 DIAGNOSIS — N952 Postmenopausal atrophic vaginitis: Secondary | ICD-10-CM | POA: Diagnosis not present

## 2024-07-30 DIAGNOSIS — I10 Essential (primary) hypertension: Secondary | ICD-10-CM | POA: Diagnosis not present

## 2024-07-30 DIAGNOSIS — E7800 Pure hypercholesterolemia, unspecified: Secondary | ICD-10-CM

## 2024-07-30 DIAGNOSIS — E063 Autoimmune thyroiditis: Secondary | ICD-10-CM

## 2024-07-30 DIAGNOSIS — Z1231 Encounter for screening mammogram for malignant neoplasm of breast: Secondary | ICD-10-CM

## 2024-07-30 DIAGNOSIS — Z23 Encounter for immunization: Secondary | ICD-10-CM

## 2024-07-30 LAB — URINALYSIS, ROUTINE W REFLEX MICROSCOPIC
Bilirubin, UA: NEGATIVE
Glucose, UA: NEGATIVE
Leukocytes,UA: NEGATIVE
Nitrite, UA: NEGATIVE
Protein,UA: NEGATIVE
Specific Gravity, UA: 1.01 (ref 1.005–1.030)
Urobilinogen, Ur: 0.2 mg/dL (ref 0.2–1.0)
pH, UA: 5.5 (ref 5.0–7.5)

## 2024-07-30 LAB — MICROSCOPIC EXAMINATION
Bacteria, UA: NONE SEEN
WBC, UA: NONE SEEN /HPF (ref 0–5)

## 2024-07-30 MED ORDER — ESTRADIOL 0.01 % VA CREA: TOPICAL_CREAM | VAGINAL | 12 refills | Status: AC

## 2024-07-30 NOTE — Assessment & Plan Note (Signed)
 Chronic, ongoing.  Never took Rosuvastatin  ordered in past due to concerns about side effects -- discussed with patient the benefit of taking this.  Obtain lipid next visit and continue to recommend diet focus + highly recommend statin therapy.

## 2024-07-30 NOTE — Progress Notes (Signed)
 BP 132/74   Pulse 68   Temp 97.8 F (36.6 C) (Oral)   Ht 4' 11 (1.499 m)   Wt 95 lb 6 oz (43.3 kg)   SpO2 99%   BMI 19.26 kg/m    Subjective:    Patient ID: Brandy Chen, female    DOB: [birth year], 73 y.o.   MRN: [id number]  HPI: Brandy Chen is a 73 y.o. female  Chief Complaint  Patient presents with   Hyperlipidemia   Hypothyroidism   HYPERTENSION  No current medications. Continues fish oil. For two weeks she has been urinating more than normal over past two weeks. Does notice feeling dry often to vaginal area and itchy.  Hypertension status: controlled  BP monitoring frequency: occasionally BP range: 120-130/70 range Aspirin: no Recurrent headaches: no Visual changes: no Palpitations: no Dyspnea: no Chest pain: no Lower extremity edema: no Dizzy/lightheaded: no  The 10-year ASCVD risk score (Arnett DK, et al., 2019) is: 17.1%   Values used to calculate the score:     Age: 41 years     Clincally relevant sex: Female     Is Non-Hispanic African American: No     Diabetic: No     Tobacco smoker: No     Systolic Blood Pressure: 132 mmHg     Is BP treated: No     HDL Cholesterol: 80 mg/dL     Total Cholesterol: 203 mg/dL   HYPOTHYROIDISM Takes Levothyroxine  50 MCG.  Thyroid  control status:stable Satisfied with current treatment? yes Medication side effects: no Medication compliance: good compliance Etiology of hypothyroidism:  Recent dose adjustment:no Fatigue: yes sometimes Cold intolerance: yes Heat intolerance: no Weight gain: no Weight loss: no Constipation: no Diarrhea/loose stools: no Palpitations: no Lower extremity edema: no Anxiety/depressed mood: no  ANXIETY/STRESS Been on Xanax  at bedtime for long period, many years.  Taking 1/2 tablet Xanax  during day and 1/2 at night.  Tried Sertraline , not successful + multiple other medications without benefit. Tried Buspar , caused vertigo.  Pt is aware of risks of benzo medication use to include  increased sedation, respiratory suppression, falls, dependence and cardiovascular events.  Pt would like to continue treatment as benefit determine to outweigh risk. This works for her per her report and helps her mood. Reviewed BEERS criteria with her. PDMP last fill 07/03/24. Duration:stable Anxious mood: yes Excessive worrying: yes Irritability: no  Sweating: no Nausea: no Palpitations:no Hyperventilation: no Panic attacks: no Agoraphobia: no  Obscessions/compulsions: no Depressed mood: no    07/30/2024   10:31 AM 04/12/2024    9:37 AM 12/07/2023    2:49 PM 05/25/2023   10:51 AM 04/04/2023   10:02 AM  Depression screen PHQ 2/9  Decreased Interest 0 0 0 0 0  Down, Depressed, Hopeless 0 1 0 0 0  PHQ - 2 Score 0 1 0 0 0  Altered sleeping 0 0 0 0 0  Tired, decreased energy 1 1 1 1  0  Change in appetite 0 0 0 0 0  Feeling bad or failure about yourself  0 0 0 0 0  Trouble concentrating 0 0 0 0 0  Moving slowly or fidgety/restless 0 0 0 0 0  Suicidal thoughts 0 0 0 0 0  PHQ-9 Score 1 2 1 1  0  Difficult doing work/chores Not difficult at all Not difficult at all Not difficult at all Not difficult at all   Anhedonia: no Weight changes: no Insomnia: none Hypersomnia: no Fatigue/loss of energy: no Feelings of worthlessness:  no Feelings of guilt: no Impaired concentration/indecisiveness: no Suicidal ideations: no  Crying spells: no Recent Stressors/Life Changes: no   Relationship problems: no   Family stress: no     Financial stress: no    Job stress: no    Recent death/loss: no     06-Aug-2024   10:31 AM 12/07/2023    2:49 PM 05/25/2023   10:51 AM 02/17/2023   11:07 AM  GAD 7 : Generalized Anxiety Score  Nervous, Anxious, on Edge 0 0 0 0  Control/stop worrying 0 0 0 0  Worry too much - different things 0 0 0 0  Trouble relaxing 0 0 0 0  Restless 0 0 0 0  Easily annoyed or irritable 0 0 0 0  Afraid - awful might happen 0 0 0 0  Total GAD 7 Score 0 0 0 0  Anxiety Difficulty  Not difficult at all Not difficult at all  Not difficult at all     Relevant past medical, surgical, family and social history reviewed and updated as indicated. Interim medical history since our last visit reviewed. Allergies and medications reviewed and updated.  Review of Systems  Constitutional:  Negative for activity change, appetite change, diaphoresis, fatigue and fever.  Respiratory:  Negative for cough, chest tightness and shortness of breath.   Cardiovascular:  Negative for chest pain, palpitations and leg swelling.  Gastrointestinal: Negative.   Neurological: Negative.   Psychiatric/Behavioral: Negative.     Per HPI unless specifically indicated above     Objective:    BP 132/74   Pulse 68   Temp 97.8 F (36.6 C) (Oral)   Ht 4' 11 (1.499 m)   Wt 95 lb 6 oz (43.3 kg)   SpO2 99%   BMI 19.26 kg/m   Wt Readings from Last 3 Encounters:  2024-08-06 95 lb 6 oz (43.3 kg)  04/12/24 91 lb (41.3 kg)  01/13/24 91 lb 3.2 oz (41.4 kg)    Physical Exam Vitals and nursing note reviewed.  Constitutional:      General: She is awake. She is not in acute distress.    Appearance: She is well-developed. She is not ill-appearing.  HENT:     Head: Normocephalic.     Right Ear: Hearing, ear canal and external ear normal.     Left Ear: Hearing, ear canal and external ear normal.  Eyes:     General: Lids are normal.        Right eye: No discharge.        Left eye: No discharge.     Conjunctiva/sclera: Conjunctivae normal.     Pupils: Pupils are equal, round, and reactive to light.  Neck:     Thyroid : No thyromegaly.     Vascular: No carotid bruit.  Cardiovascular:     Rate and Rhythm: Normal rate and regular rhythm.     Heart sounds: Normal heart sounds. No murmur heard.    No gallop.  Pulmonary:     Effort: Pulmonary effort is normal. No accessory muscle usage or respiratory distress.     Breath sounds: Normal breath sounds.  Abdominal:     General: Bowel sounds are normal.      Palpations: Abdomen is soft.  Musculoskeletal:     Cervical back: Normal range of motion and neck supple.     Right lower leg: No edema.     Left lower leg: No edema.  Skin:    General: Skin is warm and dry.  Neurological:  Mental Status: She is alert and oriented to person, place, and time.  Psychiatric:        Attention and Perception: Attention normal.        Mood and Affect: Mood normal.        Behavior: Behavior normal. Behavior is cooperative.        Thought Content: Thought content normal.        Judgment: Judgment normal.    Results for orders placed or performed in visit on 01/13/24  Microscopic Examination   Collection Time: 01/13/24 11:13 AM   Urine  Result Value Ref Range   WBC, UA None seen 0 - 5 /hpf   RBC, Urine None seen 0 - 2 /hpf   Epithelial Cells (non renal) None seen 0 - 10 /hpf   Bacteria, UA None seen None seen/Few  Urinalysis, Routine w reflex microscopic   Collection Time: 01/13/24 11:13 AM  Result Value Ref Range   Specific Gravity, UA 1.015 1.005 - 1.030   pH, UA 5.5 5.0 - 7.5   Color, UA Yellow Yellow   Appearance Ur Clear Clear   Leukocytes,UA Negative Negative   Protein,UA Negative Negative/Trace   Glucose, UA Negative Negative   Ketones, UA Negative Negative   RBC, UA 1+ (A) Negative   Bilirubin, UA Negative Negative   Urobilinogen, Ur 0.2 0.2 - 1.0 mg/dL   Nitrite, UA Negative Negative   Microscopic Examination See below:       Assessment & Plan:   Problem List Items Addressed This Visit       Cardiovascular and Mediastinum   Hypertension - Primary   Chronic, stable.  BP at goal for age without medication.  Continue current diet control.  Consider Lisinopril if elevation in BP >130/80 consistently at home and office.  DASH Diet focus recommended.  LABS: CMP.      Relevant Orders   Urinalysis, Routine w reflex microscopic     Endocrine   Hashimoto's thyroiditis   Ongoing, stable.  Continue current medication regimen and  adjust as needed.  Thyroid  labs today.      Relevant Orders   T4, free   TSH     Genitourinary   Vaginal atrophy   Suspect some vaginal atrophy present based on ongoing symptoms. Check UA today and treat as needed. Trial vaginal estrace at night to see if less urinary and vaginal symptoms.  Educated on this.        Other   Long-term current use of benzodiazepine   Refer to chronic anxiety plan of care.      Hypercholesteremia   Chronic, ongoing.  Never took Rosuvastatin  ordered in past due to concerns about side effects -- discussed with patient the benefit of taking this.  Obtain lipid next visit and continue to recommend diet focus + highly recommend statin therapy.      Relevant Orders   Comprehensive metabolic panel with GFR   Chronic anxiety   Chronic, ongoing for years and stable with Xanax .  Denies SI/HI.  Has been on Xanax  for several years, started by previous PCP. Up to date on refills.  Return in 6 months, patient and PCP came to agreeance that since she has been stable for many years on this and uses as ordered we will allow for 6 month vs 3 month visits.  Tried Buspar  in past, but reports this made her dizzy.  UDS due 12/06/24.  Has contract on file.      Other Visit Diagnoses  Flu vaccine need       Flu vaccine today, educated patient.   Relevant Orders   Flu vaccine HIGH DOSE PF(Fluzone Trivalent) (Completed)     Encounter for screening mammogram for malignant neoplasm of breast       Mammogram ordered.   Relevant Orders   MM 3D SCREENING MAMMOGRAM BILATERAL BREAST        Follow up plan: Return in about 4 months (around 12/11/2024) for Annual Physical after 12/06/24.

## 2024-07-30 NOTE — Assessment & Plan Note (Signed)
 Chronic, stable.  BP at goal for age without medication.  Continue current diet control.  Consider Lisinopril if elevation in BP >130/80 consistently at home and office.  DASH Diet focus recommended.  LABS: CMP.

## 2024-07-30 NOTE — Assessment & Plan Note (Signed)
 Chronic, ongoing for years and stable with Xanax.  Denies SI/HI.  Has been on Xanax for several years, started by previous PCP. Up to date on refills.  Return in 6 months, patient and PCP came to agreeance that since she has been stable for many years on this and uses as ordered we will allow for 6 month vs 3 month visits.  Tried Buspar in past, but reports this made her dizzy.  UDS due 12/06/24.  Has contract on file.

## 2024-07-30 NOTE — Assessment & Plan Note (Signed)
Refer to chronic anxiety plan of care. °

## 2024-07-30 NOTE — Progress Notes (Signed)
 Please let Brandy Chen know her urine does not to appear to have infection present.  There is a little blood in urine which can be from irritation, we will continue to monitor this at visits.

## 2024-07-30 NOTE — Assessment & Plan Note (Signed)
 Ongoing, stable.  Continue current medication regimen and adjust as needed.  Thyroid labs today.

## 2024-07-30 NOTE — Assessment & Plan Note (Signed)
 Suspect some vaginal atrophy present based on ongoing symptoms. Check UA today and treat as needed. Trial vaginal estrace at night to see if less urinary and vaginal symptoms.  Educated on this.

## 2024-07-31 LAB — T4, FREE: Free T4: 1.88 ng/dL — ABNORMAL HIGH (ref 0.82–1.77)

## 2024-07-31 LAB — TSH: TSH: 3.33 u[IU]/mL (ref 0.450–4.500)

## 2024-07-31 LAB — COMPREHENSIVE METABOLIC PANEL WITH GFR
ALT: 13 IU/L (ref 0–32)
AST: 21 IU/L (ref 0–40)
Albumin: 4.5 g/dL (ref 3.8–4.8)
Alkaline Phosphatase: 86 IU/L (ref 49–135)
BUN/Creatinine Ratio: 19 (ref 12–28)
BUN: 14 mg/dL (ref 8–27)
Bilirubin Total: 0.5 mg/dL (ref 0.0–1.2)
CO2: 24 mmol/L (ref 20–29)
Calcium: 9.7 mg/dL (ref 8.7–10.3)
Chloride: 100 mmol/L (ref 96–106)
Creatinine, Ser: 0.72 mg/dL (ref 0.57–1.00)
Globulin, Total: 2.6 g/dL (ref 1.5–4.5)
Glucose: 89 mg/dL (ref 70–99)
Potassium: 4.7 mmol/L (ref 3.5–5.2)
Sodium: 138 mmol/L (ref 134–144)
Total Protein: 7.1 g/dL (ref 6.0–8.5)
eGFR: 87 mL/min/1.73 (ref >59)

## 2024-07-31 NOTE — Progress Notes (Signed)
 Please let Brandy Chen know overall her labs remain stable at this time.  Continue all current medications.  Any questions? Keep being amazing!!  Thank you for allowing me to participate in your care.  I appreciate you. Kindest regards, Alynn Ellithorpe

## 2024-08-03 ENCOUNTER — Telehealth: Payer: Self-pay | Admitting: Nurse Practitioner

## 2024-08-03 NOTE — Telephone Encounter (Signed)
 Copied from CRM #[id number]. Topic: Clinical - Lab/Test Results >> Aug 03, 2024  3:08 PM Zebedee SAUNDERS wrote: Reason for CRM: Pt returning Nelwyn Laymon SAILOR, CMA call regarding lab results which were provided. Pt did not have any questions but would like her lab results mailed to: [address]  [place] [address]

## 2024-08-03 NOTE — Telephone Encounter (Signed)
Lab results printed and mailed to the patient as requested.

## 2024-08-08 DIAGNOSIS — Z1231 Encounter for screening mammogram for malignant neoplasm of breast: Secondary | ICD-10-CM | POA: Diagnosis not present

## 2024-08-08 LAB — HM MAMMOGRAPHY

## 2024-08-09 ENCOUNTER — Encounter: Payer: Self-pay | Admitting: Nurse Practitioner

## 2024-09-08 ENCOUNTER — Other Ambulatory Visit: Payer: Self-pay | Admitting: Nurse Practitioner

## 2024-09-11 NOTE — Telephone Encounter (Signed)
 Requested Prescriptions  Pending Prescriptions Disp Refills   levothyroxine  (SYNTHROID ) 50 MCG tablet [Pharmacy Med Name: LEVOTHYROXINE  50 MCG TABLET] 90 tablet 1    Sig: TAKE 1 TABLET BY MOUTH EVERY DAY     Endocrinology:  Hypothyroid Agents Passed - 09/11/2024  9:59 AM      Passed - TSH in normal range and within 360 days    TSH  Date Value Ref Range Status  07/30/2024 3.330 0.450 - 4.500 uIU/mL Final         Passed - Valid encounter within last 12 months    Recent Outpatient Visits           1 month ago Primary hypertension   [place] [facility] [place], Greenwald T, NP   8 months ago Urinary symptom or sign   [place] [facility] [place], Melanie DASEN, NP   9 months ago Primary hypertension   [place] [facility] [place], Melanie DASEN, NP

## 2024-12-12 ENCOUNTER — Encounter: Admitting: Nurse Practitioner

## 2025-04-16 ENCOUNTER — Ambulatory Visit
